# Patient Record
Sex: Female | Born: 1945 | Race: Black or African American | Hispanic: No | Marital: Married | Smoking: Never smoker
Health system: Southern US, Community
[De-identification: ages and names within clinical notes are randomized; demographics above are authoritative.]

## PROBLEM LIST (undated history)

## (undated) DIAGNOSIS — M19071 Primary osteoarthritis, right ankle and foot: Secondary | ICD-10-CM

## (undated) DIAGNOSIS — M8580 Other specified disorders of bone density and structure, unspecified site: Secondary | ICD-10-CM

## (undated) DIAGNOSIS — M25561 Pain in right knee: Secondary | ICD-10-CM

## (undated) DIAGNOSIS — K5792 Diverticulitis of intestine, part unspecified, without perforation or abscess without bleeding: Secondary | ICD-10-CM

## (undated) DIAGNOSIS — M170 Bilateral primary osteoarthritis of knee: Secondary | ICD-10-CM

## (undated) DIAGNOSIS — I10 Essential (primary) hypertension: Secondary | ICD-10-CM

## (undated) DIAGNOSIS — M109 Gout, unspecified: Secondary | ICD-10-CM

## (undated) DIAGNOSIS — L0292 Furuncle, unspecified: Secondary | ICD-10-CM

## (undated) DIAGNOSIS — M19042 Primary osteoarthritis, left hand: Secondary | ICD-10-CM

## (undated) DIAGNOSIS — M19041 Primary osteoarthritis, right hand: Secondary | ICD-10-CM

## (undated) DIAGNOSIS — M25562 Pain in left knee: Secondary | ICD-10-CM

## (undated) DIAGNOSIS — L0293 Carbuncle, unspecified: Secondary | ICD-10-CM

## (undated) DIAGNOSIS — M19072 Primary osteoarthritis, left ankle and foot: Secondary | ICD-10-CM

## (undated) DIAGNOSIS — G4700 Insomnia, unspecified: Secondary | ICD-10-CM

## (undated) DIAGNOSIS — M1990 Unspecified osteoarthritis, unspecified site: Secondary | ICD-10-CM

## (undated) DIAGNOSIS — E785 Hyperlipidemia, unspecified: Secondary | ICD-10-CM

## (undated) HISTORY — DX: Pain in right knee: M25.561

## (undated) HISTORY — DX: Gout, unspecified: M10.9

## (undated) HISTORY — DX: Essential (primary) hypertension: I10

## (undated) HISTORY — DX: Other specified disorders of bone density and structure, unspecified site: M85.80

## (undated) HISTORY — DX: Furuncle, unspecified: L02.92

## (undated) HISTORY — DX: Unspecified osteoarthritis, unspecified site: M19.90

## (undated) HISTORY — DX: Insomnia, unspecified: G47.00

## (undated) HISTORY — DX: Pain in right knee: M25.562

## (undated) HISTORY — DX: Primary osteoarthritis, left hand: M19.042

## (undated) HISTORY — DX: Carbuncle, unspecified: L02.93

## (undated) HISTORY — DX: Primary osteoarthritis, left ankle and foot: M19.072

## (undated) HISTORY — DX: Hyperlipidemia, unspecified: E78.5

## (undated) HISTORY — DX: Bilateral primary osteoarthritis of knee: M17.0

## (undated) HISTORY — PX: CHOLECYSTECTOMY: SHX55

## (undated) HISTORY — DX: Primary osteoarthritis, right ankle and foot: M19.071

## (undated) HISTORY — DX: Diverticulitis of intestine, part unspecified, without perforation or abscess without bleeding: K57.92

## (undated) HISTORY — DX: Primary osteoarthritis, right hand: M19.041

## (undated) HISTORY — PX: TONSILLECTOMY: SUR1361

---

## 1998-11-15 DIAGNOSIS — K5792 Diverticulitis of intestine, part unspecified, without perforation or abscess without bleeding: Secondary | ICD-10-CM

## 1998-11-15 HISTORY — DX: Diverticulitis of intestine, part unspecified, without perforation or abscess without bleeding: K57.92

## 1999-02-16 ENCOUNTER — Other Ambulatory Visit: Admission: RE | Admit: 1999-02-16 | Discharge: 1999-02-16 | Payer: Self-pay | Admitting: Obstetrics

## 1999-04-23 ENCOUNTER — Other Ambulatory Visit: Admission: RE | Admit: 1999-04-23 | Discharge: 1999-04-23 | Payer: Self-pay | Admitting: Obstetrics

## 2000-01-22 ENCOUNTER — Inpatient Hospital Stay (HOSPITAL_COMMUNITY): Admission: AD | Admit: 2000-01-22 | Discharge: 2000-01-25 | Payer: Self-pay | Admitting: General Surgery

## 2000-01-22 ENCOUNTER — Encounter (HOSPITAL_BASED_OUTPATIENT_CLINIC_OR_DEPARTMENT_OTHER): Payer: Self-pay | Admitting: General Surgery

## 2000-03-02 ENCOUNTER — Other Ambulatory Visit: Admission: RE | Admit: 2000-03-02 | Discharge: 2000-03-02 | Payer: Self-pay | Admitting: Obstetrics

## 2000-03-21 ENCOUNTER — Encounter (INDEPENDENT_AMBULATORY_CARE_PROVIDER_SITE_OTHER): Payer: Self-pay | Admitting: Specialist

## 2000-03-21 ENCOUNTER — Ambulatory Visit (HOSPITAL_COMMUNITY): Admission: RE | Admit: 2000-03-21 | Discharge: 2000-03-21 | Payer: Self-pay | Admitting: Gastroenterology

## 2001-03-15 ENCOUNTER — Other Ambulatory Visit: Admission: RE | Admit: 2001-03-15 | Discharge: 2001-03-15 | Payer: Self-pay | Admitting: Obstetrics

## 2002-08-22 ENCOUNTER — Encounter: Admission: RE | Admit: 2002-08-22 | Discharge: 2002-11-20 | Payer: Self-pay | Admitting: Family Medicine

## 2002-09-12 ENCOUNTER — Encounter: Admission: RE | Admit: 2002-09-12 | Discharge: 2002-10-08 | Payer: Self-pay | Admitting: Family Medicine

## 2002-09-20 ENCOUNTER — Encounter: Admission: RE | Admit: 2002-09-20 | Discharge: 2002-12-19 | Payer: Self-pay | Admitting: Family Medicine

## 2003-02-12 ENCOUNTER — Encounter: Admission: RE | Admit: 2003-02-12 | Discharge: 2003-05-13 | Payer: Self-pay | Admitting: Family Medicine

## 2003-06-13 ENCOUNTER — Encounter: Admission: RE | Admit: 2003-06-13 | Discharge: 2003-06-13 | Payer: Self-pay | Admitting: Family Medicine

## 2003-06-13 ENCOUNTER — Encounter: Payer: Self-pay | Admitting: Family Medicine

## 2005-09-29 ENCOUNTER — Encounter: Payer: Self-pay | Admitting: Internal Medicine

## 2006-06-20 ENCOUNTER — Ambulatory Visit: Payer: Self-pay | Admitting: Family Medicine

## 2006-08-03 ENCOUNTER — Ambulatory Visit: Payer: Self-pay | Admitting: Family Medicine

## 2006-08-15 ENCOUNTER — Ambulatory Visit: Payer: Self-pay | Admitting: Family Medicine

## 2006-08-19 ENCOUNTER — Ambulatory Visit: Payer: Self-pay | Admitting: Family Medicine

## 2006-09-16 ENCOUNTER — Ambulatory Visit: Payer: Self-pay | Admitting: Family Medicine

## 2006-10-10 ENCOUNTER — Ambulatory Visit: Payer: Self-pay | Admitting: Family Medicine

## 2006-11-03 ENCOUNTER — Ambulatory Visit: Payer: Self-pay | Admitting: Family Medicine

## 2006-12-12 ENCOUNTER — Ambulatory Visit: Payer: Self-pay | Admitting: Internal Medicine

## 2006-12-13 ENCOUNTER — Ambulatory Visit: Payer: Self-pay | Admitting: Family Medicine

## 2006-12-13 LAB — CONVERTED CEMR LAB
ALT: 22 units/L (ref 0–40)
AST: 20 units/L (ref 0–37)
BUN: 15 mg/dL (ref 6–23)
CO2: 26 meq/L (ref 19–32)
Calcium: 9.5 mg/dL (ref 8.4–10.5)
Chloride: 108 meq/L (ref 96–112)
Cholesterol: 147 mg/dL (ref 0–200)
Creatinine, Ser: 0.7 mg/dL (ref 0.4–1.2)
Creatinine,U: 111.4 mg/dL
GFR calc Af Amer: 110 mL/min
GFR calc non Af Amer: 91 mL/min
Glucose, Bld: 129 mg/dL — ABNORMAL HIGH (ref 70–99)
HDL: 76.6 mg/dL (ref >39.0)
Hgb A1c MFr Bld: 6.7 % — ABNORMAL HIGH (ref 4.6–6.0)
LDL Cholesterol: 59 mg/dL (ref 0–99)
Microalb Creat Ratio: 3.6 mg/g (ref 0.0–30.0)
Microalb, Ur: 0.4 mg/dL (ref 0.0–1.9)
Potassium: 3.8 meq/L (ref 3.5–5.1)
Sodium: 141 meq/L (ref 135–145)
Total CHOL/HDL Ratio: 1.9
Triglycerides: 56 mg/dL (ref 0–149)
VLDL: 11 mg/dL (ref 0–40)

## 2007-01-02 DIAGNOSIS — E119 Type 2 diabetes mellitus without complications: Secondary | ICD-10-CM

## 2007-01-02 DIAGNOSIS — E1165 Type 2 diabetes mellitus with hyperglycemia: Secondary | ICD-10-CM | POA: Insufficient documentation

## 2007-01-10 ENCOUNTER — Ambulatory Visit: Payer: Self-pay | Admitting: Family Medicine

## 2007-01-10 LAB — CONVERTED CEMR LAB
BUN: 11 mg/dL (ref 6–23)
CO2: 30 meq/L (ref 19–32)
Calcium: 9.7 mg/dL (ref 8.4–10.5)
Chloride: 109 meq/L (ref 96–112)
Creatinine, Ser: 0.7 mg/dL (ref 0.4–1.2)
GFR calc Af Amer: 110 mL/min
GFR calc non Af Amer: 91 mL/min
Glucose, Bld: 129 mg/dL — ABNORMAL HIGH (ref 70–99)
Potassium: 3.8 meq/L (ref 3.5–5.1)
Sodium: 145 meq/L (ref 135–145)

## 2007-01-11 ENCOUNTER — Encounter: Payer: Self-pay | Admitting: Internal Medicine

## 2007-03-14 ENCOUNTER — Ambulatory Visit: Payer: Self-pay | Admitting: Family Medicine

## 2007-03-14 LAB — CONVERTED CEMR LAB
BUN: 13 mg/dL (ref 6–23)
CO2: 30 meq/L (ref 19–32)
Calcium: 10 mg/dL (ref 8.4–10.5)
Chloride: 108 meq/L (ref 96–112)
Creatinine, Ser: 0.6 mg/dL (ref 0.4–1.2)
GFR calc Af Amer: 132 mL/min
GFR calc non Af Amer: 109 mL/min
Glucose, Bld: 149 mg/dL — ABNORMAL HIGH (ref 70–99)
Hgb A1c MFr Bld: 6.8 % — ABNORMAL HIGH (ref 4.6–6.0)
Potassium: 4.4 meq/L (ref 3.5–5.1)
Sodium: 143 meq/L (ref 135–145)

## 2007-05-01 ENCOUNTER — Ambulatory Visit: Payer: Self-pay | Admitting: Internal Medicine

## 2007-05-01 LAB — CONVERTED CEMR LAB
Bilirubin Urine: NEGATIVE
Glucose, Urine, Semiquant: NEGATIVE
Ketones, urine, test strip: NEGATIVE
Nitrite: NEGATIVE
Protein, U semiquant: 30
Specific Gravity, Urine: >=1.03
Urobilinogen, UA: NEGATIVE
pH: 6

## 2007-05-04 ENCOUNTER — Encounter: Payer: Self-pay | Admitting: Internal Medicine

## 2007-06-14 ENCOUNTER — Ambulatory Visit: Payer: Self-pay | Admitting: Family Medicine

## 2007-06-14 DIAGNOSIS — E785 Hyperlipidemia, unspecified: Secondary | ICD-10-CM

## 2007-06-14 DIAGNOSIS — M1990 Unspecified osteoarthritis, unspecified site: Secondary | ICD-10-CM | POA: Insufficient documentation

## 2007-06-14 DIAGNOSIS — E1169 Type 2 diabetes mellitus with other specified complication: Secondary | ICD-10-CM | POA: Insufficient documentation

## 2007-06-14 DIAGNOSIS — I10 Essential (primary) hypertension: Secondary | ICD-10-CM

## 2007-06-14 LAB — CONVERTED CEMR LAB
ALT: 19 units/L (ref 0–35)
AST: 17 units/L (ref 0–37)
BUN: 15 mg/dL (ref 6–23)
CO2: 28 meq/L (ref 19–32)
Calcium: 9.7 mg/dL (ref 8.4–10.5)
Chloride: 105 meq/L (ref 96–112)
Cholesterol: 154 mg/dL (ref 0–200)
Creatinine, Ser: 0.7 mg/dL (ref 0.4–1.2)
Creatinine,U: 131.4 mg/dL
GFR calc Af Amer: 110 mL/min
GFR calc non Af Amer: 91 mL/min
Glucose, Bld: 120 mg/dL — ABNORMAL HIGH (ref 70–99)
HDL: 67.2 mg/dL (ref >39.0)
Hgb A1c MFr Bld: 6.1 % — ABNORMAL HIGH (ref 4.6–6.0)
LDL Cholesterol: 76 mg/dL (ref 0–99)
Microalb Creat Ratio: 2.3 mg/g (ref 0.0–30.0)
Microalb, Ur: 0.3 mg/dL (ref 0.0–1.9)
Potassium: 3.6 meq/L (ref 3.5–5.1)
Sodium: 140 meq/L (ref 135–145)
Total CHOL/HDL Ratio: 2.3
Triglycerides: 55 mg/dL (ref 0–149)
VLDL: 11 mg/dL (ref 0–40)

## 2007-06-15 ENCOUNTER — Ambulatory Visit: Payer: Self-pay | Admitting: Cardiology

## 2007-06-16 ENCOUNTER — Telehealth (INDEPENDENT_AMBULATORY_CARE_PROVIDER_SITE_OTHER): Payer: Self-pay | Admitting: *Deleted

## 2007-06-23 ENCOUNTER — Encounter (INDEPENDENT_AMBULATORY_CARE_PROVIDER_SITE_OTHER): Payer: Self-pay | Admitting: Family Medicine

## 2007-06-23 ENCOUNTER — Ambulatory Visit: Payer: Self-pay

## 2007-07-03 ENCOUNTER — Encounter (INDEPENDENT_AMBULATORY_CARE_PROVIDER_SITE_OTHER): Payer: Self-pay | Admitting: Family Medicine

## 2007-07-07 ENCOUNTER — Ambulatory Visit: Payer: Self-pay | Admitting: Family Medicine

## 2007-07-07 LAB — CONVERTED CEMR LAB
Cholesterol, target level: 200 mg/dL
HDL goal, serum: 40 mg/dL
LDL Goal: 100 mg/dL

## 2007-08-17 ENCOUNTER — Encounter (INDEPENDENT_AMBULATORY_CARE_PROVIDER_SITE_OTHER): Payer: Self-pay | Admitting: Family Medicine

## 2007-08-24 ENCOUNTER — Telehealth (INDEPENDENT_AMBULATORY_CARE_PROVIDER_SITE_OTHER): Payer: Self-pay | Admitting: *Deleted

## 2007-08-25 ENCOUNTER — Ambulatory Visit: Payer: Self-pay | Admitting: Family Medicine

## 2007-08-25 ENCOUNTER — Telehealth (INDEPENDENT_AMBULATORY_CARE_PROVIDER_SITE_OTHER): Payer: Self-pay | Admitting: *Deleted

## 2007-08-26 ENCOUNTER — Telehealth (INDEPENDENT_AMBULATORY_CARE_PROVIDER_SITE_OTHER): Payer: Self-pay | Admitting: Family Medicine

## 2007-09-11 ENCOUNTER — Telehealth (INDEPENDENT_AMBULATORY_CARE_PROVIDER_SITE_OTHER): Payer: Self-pay | Admitting: *Deleted

## 2007-10-02 ENCOUNTER — Telehealth (INDEPENDENT_AMBULATORY_CARE_PROVIDER_SITE_OTHER): Payer: Self-pay | Admitting: *Deleted

## 2007-10-10 ENCOUNTER — Ambulatory Visit: Payer: Self-pay | Admitting: Family Medicine

## 2007-10-10 LAB — CONVERTED CEMR LAB
CO2: 30 meq/L (ref 19–32)
Calcium: 10 mg/dL (ref 8.4–10.5)
Cholesterol: 168 mg/dL (ref 0–200)
GFR calc Af Amer: 131 mL/min
GFR calc non Af Amer: 108 mL/min
Glucose, Bld: 133 mg/dL — ABNORMAL HIGH (ref 70–99)
LDL Cholesterol: 71 mg/dL (ref 0–99)
Potassium: 4.1 meq/L (ref 3.5–5.1)
Total CHOL/HDL Ratio: 1.9
Triglycerides: 46 mg/dL (ref 0–149)

## 2007-10-11 ENCOUNTER — Encounter (INDEPENDENT_AMBULATORY_CARE_PROVIDER_SITE_OTHER): Payer: Self-pay | Admitting: *Deleted

## 2007-10-19 IMAGING — CR DG CHEST 2V
2 series · 2 of 2 positions shown · non-contrast
Comparison: 05/24/2003 digitized chest x-ray from [REDACTED].

CLINICAL DATA: Cough.
PA AND LATERAL CHEST:

[view not recorded (1 of 2)]
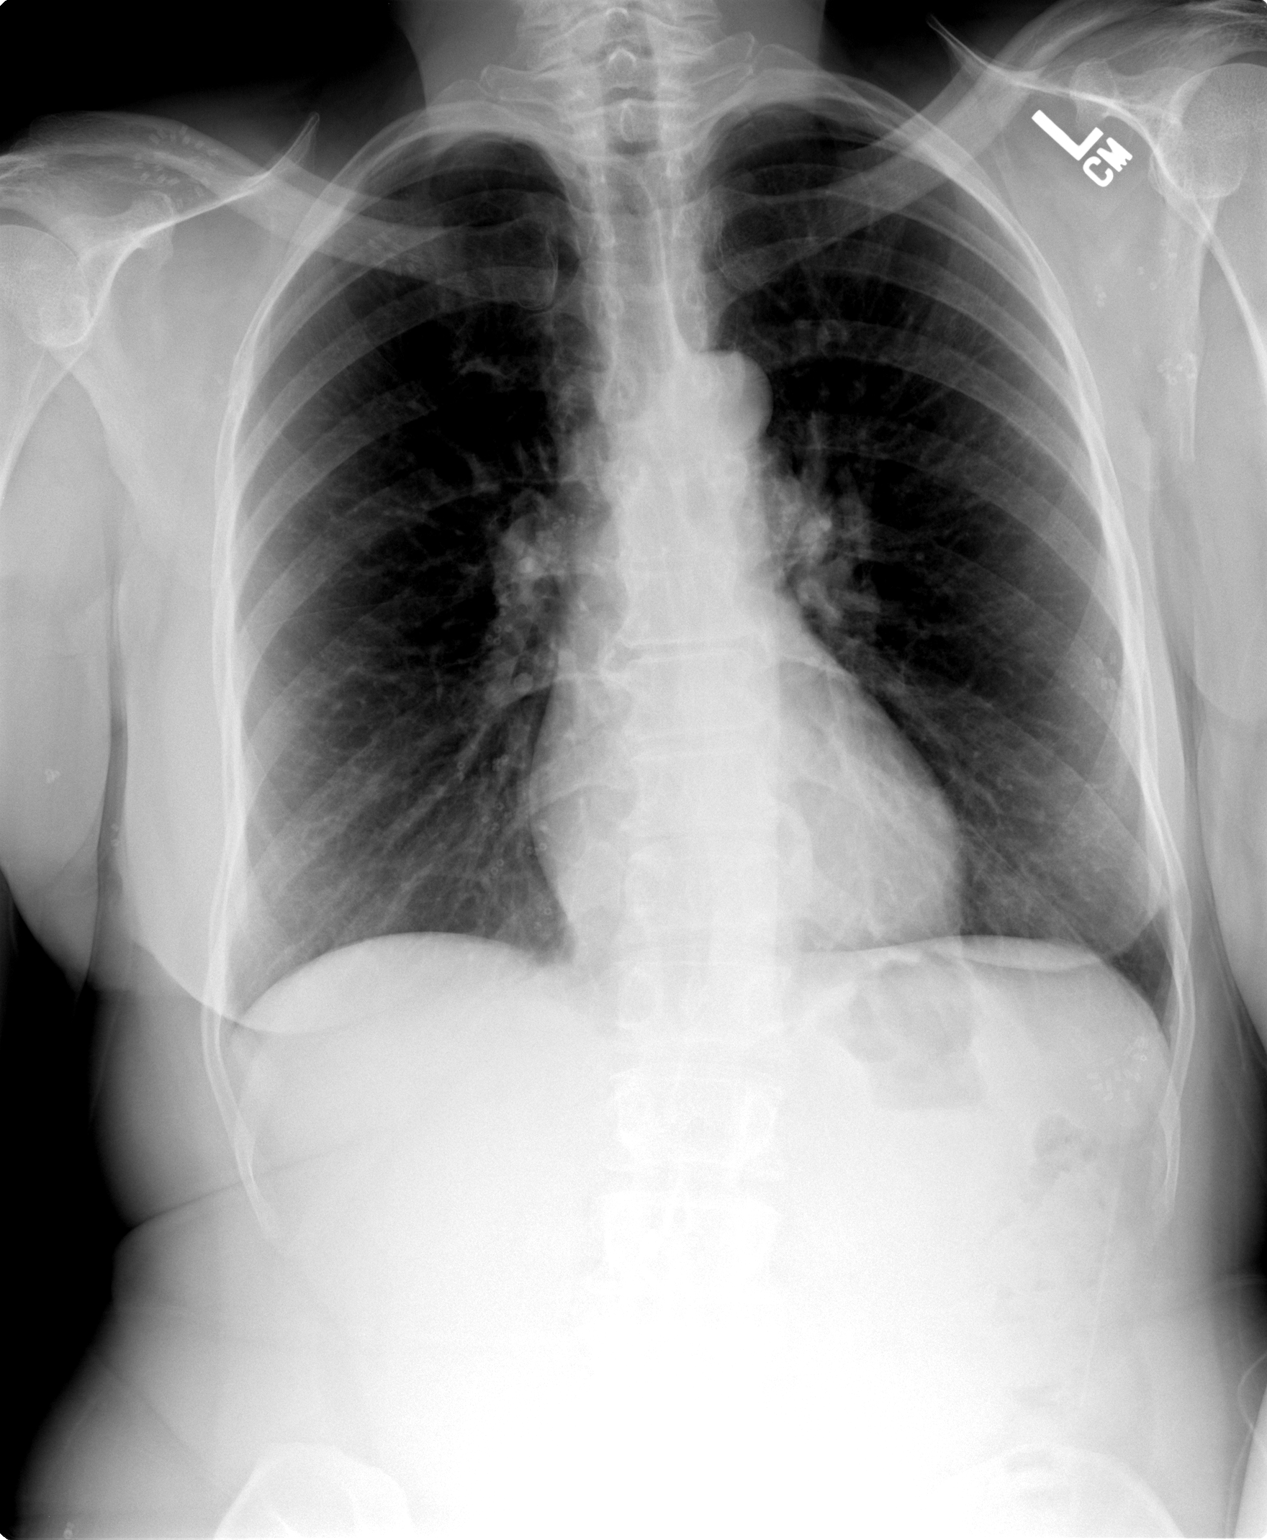

[view not recorded (2 of 2)]
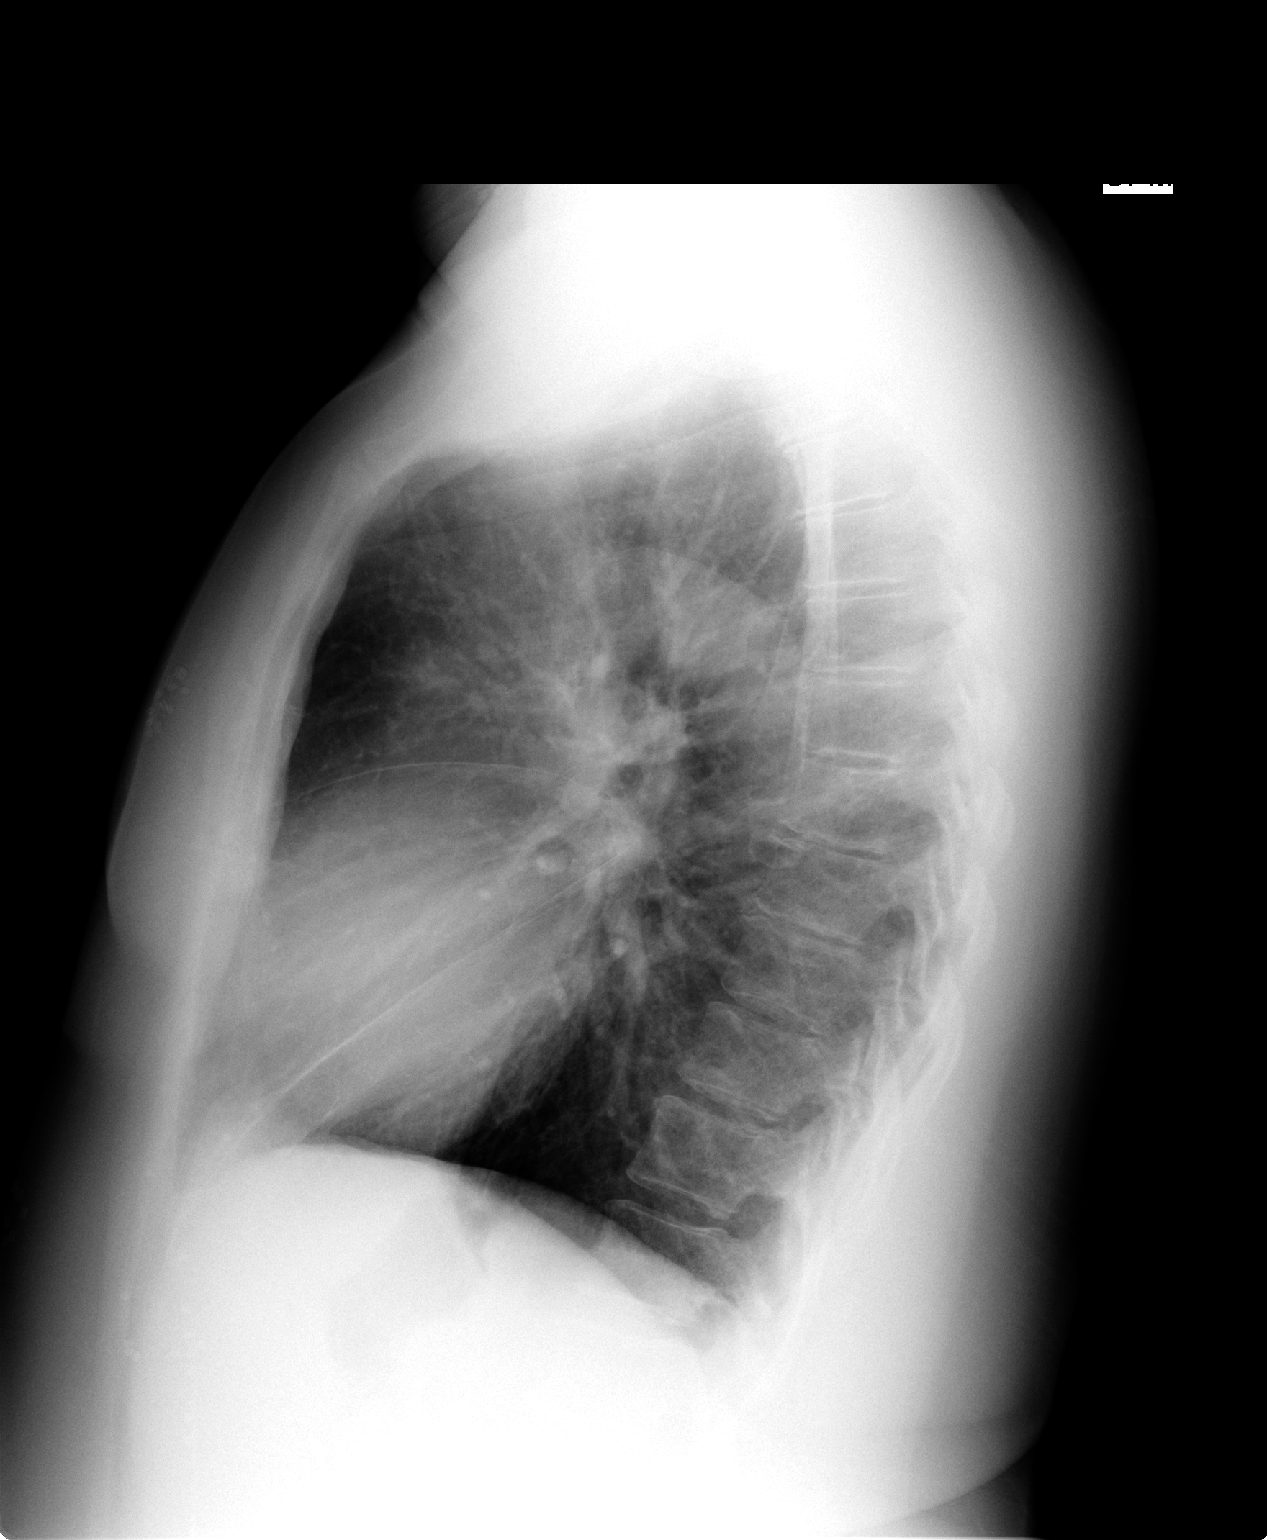

[2 of 2 positions shown; findings below may reference images not displayed]

FINDINGS: The heart size and mediastinal contours are within normal limits.  Both lungs are clear.  The visualized skeletal structures are unremarkable.
Clips project over the epigastrium.  Multiple radio-opacities project over the patient?s soft tissues and the body itself, raising the question of something overlying the patient such as clothing with radiopaque objects.
IMPRESSION: 1. No active cardiopulmonary disease.
2. Multiple radio-opacities overlie the patient; is the patient wearing an item of clothing with beads or other radiopaque objects?

## 2007-10-26 ENCOUNTER — Telehealth (INDEPENDENT_AMBULATORY_CARE_PROVIDER_SITE_OTHER): Payer: Self-pay | Admitting: *Deleted

## 2007-10-31 ENCOUNTER — Ambulatory Visit: Payer: Self-pay | Admitting: Family Medicine

## 2007-11-06 ENCOUNTER — Telehealth (INDEPENDENT_AMBULATORY_CARE_PROVIDER_SITE_OTHER): Payer: Self-pay | Admitting: *Deleted

## 2007-11-13 ENCOUNTER — Telehealth (INDEPENDENT_AMBULATORY_CARE_PROVIDER_SITE_OTHER): Payer: Self-pay | Admitting: *Deleted

## 2007-12-04 ENCOUNTER — Telehealth (INDEPENDENT_AMBULATORY_CARE_PROVIDER_SITE_OTHER): Payer: Self-pay | Admitting: *Deleted

## 2007-12-11 ENCOUNTER — Telehealth (INDEPENDENT_AMBULATORY_CARE_PROVIDER_SITE_OTHER): Payer: Self-pay | Admitting: *Deleted

## 2008-01-02 ENCOUNTER — Telehealth (INDEPENDENT_AMBULATORY_CARE_PROVIDER_SITE_OTHER): Payer: Self-pay | Admitting: *Deleted

## 2008-01-11 ENCOUNTER — Ambulatory Visit: Payer: Self-pay | Admitting: Family Medicine

## 2008-01-11 ENCOUNTER — Telehealth (INDEPENDENT_AMBULATORY_CARE_PROVIDER_SITE_OTHER): Payer: Self-pay | Admitting: *Deleted

## 2008-01-16 LAB — CONVERTED CEMR LAB
CO2: 28 meq/L (ref 19–32)
Chloride: 106 meq/L (ref 96–112)
Creatinine, Ser: 0.7 mg/dL (ref 0.4–1.2)
Glucose, Bld: 130 mg/dL — ABNORMAL HIGH (ref 70–99)
Potassium: 4.2 meq/L (ref 3.5–5.1)
Sodium: 140 meq/L (ref 135–145)

## 2008-01-17 ENCOUNTER — Encounter (INDEPENDENT_AMBULATORY_CARE_PROVIDER_SITE_OTHER): Payer: Self-pay | Admitting: *Deleted

## 2008-02-01 ENCOUNTER — Telehealth (INDEPENDENT_AMBULATORY_CARE_PROVIDER_SITE_OTHER): Payer: Self-pay | Admitting: *Deleted

## 2008-02-08 ENCOUNTER — Encounter: Payer: Self-pay | Admitting: Internal Medicine

## 2008-02-28 ENCOUNTER — Telehealth: Payer: Self-pay | Admitting: Internal Medicine

## 2008-03-18 ENCOUNTER — Telehealth (INDEPENDENT_AMBULATORY_CARE_PROVIDER_SITE_OTHER): Payer: Self-pay | Admitting: *Deleted

## 2008-03-29 ENCOUNTER — Telehealth (INDEPENDENT_AMBULATORY_CARE_PROVIDER_SITE_OTHER): Payer: Self-pay | Admitting: *Deleted

## 2008-04-09 ENCOUNTER — Telehealth (INDEPENDENT_AMBULATORY_CARE_PROVIDER_SITE_OTHER): Payer: Self-pay | Admitting: *Deleted

## 2008-05-10 ENCOUNTER — Ambulatory Visit: Payer: Self-pay | Admitting: Internal Medicine

## 2008-05-15 ENCOUNTER — Encounter (INDEPENDENT_AMBULATORY_CARE_PROVIDER_SITE_OTHER): Payer: Self-pay | Admitting: *Deleted

## 2008-05-15 LAB — CONVERTED CEMR LAB
AST: 19 units/L (ref 0–37)
Creatinine,U: 123.7 mg/dL
Eosinophils Absolute: 0.2 10*3/uL (ref 0.0–0.7)
HCT: 37.6 % (ref 36.0–46.0)
Hemoglobin: 12.9 g/dL (ref 12.0–15.0)
MCHC: 34.4 g/dL (ref 30.0–36.0)
MCV: 90.8 fL (ref 78.0–100.0)
Monocytes Absolute: 0.3 10*3/uL (ref 0.1–1.0)
Monocytes Relative: 6.4 % (ref 3.0–12.0)
Neutro Abs: 2.6 10*3/uL (ref 1.4–7.7)
Platelets: 239 10*3/uL (ref 150–400)
RDW: 12.5 % (ref 11.5–14.6)

## 2008-07-26 ENCOUNTER — Telehealth (INDEPENDENT_AMBULATORY_CARE_PROVIDER_SITE_OTHER): Payer: Self-pay | Admitting: *Deleted

## 2008-08-20 ENCOUNTER — Telehealth (INDEPENDENT_AMBULATORY_CARE_PROVIDER_SITE_OTHER): Payer: Self-pay | Admitting: *Deleted

## 2008-08-21 ENCOUNTER — Telehealth (INDEPENDENT_AMBULATORY_CARE_PROVIDER_SITE_OTHER): Payer: Self-pay | Admitting: *Deleted

## 2008-09-05 ENCOUNTER — Telehealth (INDEPENDENT_AMBULATORY_CARE_PROVIDER_SITE_OTHER): Payer: Self-pay | Admitting: *Deleted

## 2008-09-10 ENCOUNTER — Telehealth (INDEPENDENT_AMBULATORY_CARE_PROVIDER_SITE_OTHER): Payer: Self-pay | Admitting: *Deleted

## 2008-09-12 ENCOUNTER — Ambulatory Visit: Payer: Self-pay | Admitting: Internal Medicine

## 2008-09-12 DIAGNOSIS — M1120 Other chondrocalcinosis, unspecified site: Secondary | ICD-10-CM | POA: Insufficient documentation

## 2008-09-13 ENCOUNTER — Ambulatory Visit: Payer: Self-pay | Admitting: Internal Medicine

## 2008-09-17 ENCOUNTER — Telehealth: Payer: Self-pay | Admitting: Internal Medicine

## 2008-09-17 ENCOUNTER — Encounter (INDEPENDENT_AMBULATORY_CARE_PROVIDER_SITE_OTHER): Payer: Self-pay | Admitting: *Deleted

## 2008-09-17 LAB — CONVERTED CEMR LAB
BUN: 14 mg/dL (ref 6–23)
CO2: 29 meq/L (ref 19–32)
Calcium: 9.5 mg/dL (ref 8.4–10.5)
Chloride: 110 meq/L (ref 96–112)
Cholesterol: 138 mg/dL (ref 0–200)
Creatinine, Ser: 0.6 mg/dL (ref 0.4–1.2)
GFR calc non Af Amer: 108 mL/min
HDL: 72.2 mg/dL (ref >39.0)
LDL Cholesterol: 57 mg/dL (ref 0–99)
Triglycerides: 43 mg/dL (ref 0–149)

## 2008-11-04 ENCOUNTER — Telehealth (INDEPENDENT_AMBULATORY_CARE_PROVIDER_SITE_OTHER): Payer: Self-pay | Admitting: *Deleted

## 2008-11-11 ENCOUNTER — Telehealth (INDEPENDENT_AMBULATORY_CARE_PROVIDER_SITE_OTHER): Payer: Self-pay | Admitting: *Deleted

## 2008-11-12 ENCOUNTER — Telehealth (INDEPENDENT_AMBULATORY_CARE_PROVIDER_SITE_OTHER): Payer: Self-pay | Admitting: *Deleted

## 2008-11-18 ENCOUNTER — Telehealth (INDEPENDENT_AMBULATORY_CARE_PROVIDER_SITE_OTHER): Payer: Self-pay | Admitting: *Deleted

## 2008-11-19 ENCOUNTER — Ambulatory Visit: Payer: Self-pay | Admitting: Family Medicine

## 2008-11-27 ENCOUNTER — Encounter (INDEPENDENT_AMBULATORY_CARE_PROVIDER_SITE_OTHER): Payer: Self-pay | Admitting: *Deleted

## 2008-12-25 ENCOUNTER — Telehealth (INDEPENDENT_AMBULATORY_CARE_PROVIDER_SITE_OTHER): Payer: Self-pay | Admitting: *Deleted

## 2009-01-10 ENCOUNTER — Ambulatory Visit: Payer: Self-pay | Admitting: Internal Medicine

## 2009-01-10 ENCOUNTER — Telehealth (INDEPENDENT_AMBULATORY_CARE_PROVIDER_SITE_OTHER): Payer: Self-pay | Admitting: *Deleted

## 2009-01-10 DIAGNOSIS — R1310 Dysphagia, unspecified: Secondary | ICD-10-CM | POA: Insufficient documentation

## 2009-01-15 LAB — CONVERTED CEMR LAB
ALT: 26 units/L (ref 0–35)
AST: 21 units/L (ref 0–37)
Alkaline Phosphatase: 81 units/L (ref 39–117)
Bilirubin, Direct: 0.1 mg/dL (ref 0.0–0.3)
Eosinophils Relative: 3.4 % (ref 0.0–5.0)
HDL: 73.3 mg/dL (ref >39.0)
Monocytes Relative: 6.3 % (ref 3.0–12.0)
Neutrophils Relative %: 50.3 % (ref 43.0–77.0)
Platelets: 240 10*3/uL (ref 150–400)
Total CHOL/HDL Ratio: 2.1
Total Protein: 7.5 g/dL (ref 6.0–8.3)
VLDL: 12 mg/dL (ref 0–40)
WBC: 3.9 10*3/uL — ABNORMAL LOW (ref 4.5–10.5)

## 2009-01-17 ENCOUNTER — Encounter: Payer: Self-pay | Admitting: Internal Medicine

## 2009-01-21 ENCOUNTER — Ambulatory Visit: Payer: Self-pay | Admitting: Internal Medicine

## 2009-01-21 ENCOUNTER — Encounter: Payer: Self-pay | Admitting: Internal Medicine

## 2009-01-28 ENCOUNTER — Telehealth (INDEPENDENT_AMBULATORY_CARE_PROVIDER_SITE_OTHER): Payer: Self-pay | Admitting: *Deleted

## 2009-02-13 ENCOUNTER — Telehealth (INDEPENDENT_AMBULATORY_CARE_PROVIDER_SITE_OTHER): Payer: Self-pay | Admitting: *Deleted

## 2009-02-19 ENCOUNTER — Encounter (INDEPENDENT_AMBULATORY_CARE_PROVIDER_SITE_OTHER): Payer: Self-pay | Admitting: *Deleted

## 2009-03-05 ENCOUNTER — Telehealth: Payer: Self-pay | Admitting: Internal Medicine

## 2009-03-07 ENCOUNTER — Telehealth (INDEPENDENT_AMBULATORY_CARE_PROVIDER_SITE_OTHER): Payer: Self-pay | Admitting: *Deleted

## 2009-04-03 ENCOUNTER — Encounter: Payer: Self-pay | Admitting: Internal Medicine

## 2009-04-08 ENCOUNTER — Telehealth (INDEPENDENT_AMBULATORY_CARE_PROVIDER_SITE_OTHER): Payer: Self-pay | Admitting: *Deleted

## 2009-05-14 ENCOUNTER — Ambulatory Visit: Payer: Self-pay | Admitting: Internal Medicine

## 2009-05-14 DIAGNOSIS — M8580 Other specified disorders of bone density and structure, unspecified site: Secondary | ICD-10-CM | POA: Insufficient documentation

## 2009-05-16 ENCOUNTER — Encounter (INDEPENDENT_AMBULATORY_CARE_PROVIDER_SITE_OTHER): Payer: Self-pay | Admitting: *Deleted

## 2009-05-16 LAB — CONVERTED CEMR LAB
CO2: 28 meq/L (ref 19–32)
GFR calc non Af Amer: 130.32 mL/min (ref >60)
Glucose, Bld: 112 mg/dL — ABNORMAL HIGH (ref 70–99)
Potassium: 4.2 meq/L (ref 3.5–5.1)
Sodium: 143 meq/L (ref 135–145)

## 2009-09-30 ENCOUNTER — Encounter: Payer: Self-pay | Admitting: Internal Medicine

## 2009-10-03 ENCOUNTER — Encounter: Payer: Self-pay | Admitting: Internal Medicine

## 2009-10-03 LAB — CONVERTED CEMR LAB
ANA Titer 1: NEGATIVE
AST: 15 units/L
Albumin: 4.7 g/dL
BUN: 14 mg/dL
Calcium: 10 mg/dL
HCT: 38.9 %
Hemoglobin: 12.7 g/dL
Platelets: 283 10*3/uL
Rhuematoid fact SerPl-aCnc: <20 intl units/mL
Sed Rate: 12 mm/hr
Sodium, serum: 141 mmol/L

## 2009-10-06 ENCOUNTER — Telehealth (INDEPENDENT_AMBULATORY_CARE_PROVIDER_SITE_OTHER): Payer: Self-pay | Admitting: *Deleted

## 2009-10-07 ENCOUNTER — Encounter (INDEPENDENT_AMBULATORY_CARE_PROVIDER_SITE_OTHER): Payer: Self-pay | Admitting: *Deleted

## 2009-10-14 ENCOUNTER — Ambulatory Visit: Payer: Self-pay | Admitting: Internal Medicine

## 2009-10-23 ENCOUNTER — Encounter (INDEPENDENT_AMBULATORY_CARE_PROVIDER_SITE_OTHER): Payer: Self-pay | Admitting: *Deleted

## 2009-10-23 LAB — CONVERTED CEMR LAB
Hgb A1c MFr Bld: 6.2 % (ref 4.6–6.5)
Total CK: 98 units/L (ref 7–177)

## 2009-12-10 ENCOUNTER — Encounter: Payer: Self-pay | Admitting: Internal Medicine

## 2010-03-17 ENCOUNTER — Encounter (INDEPENDENT_AMBULATORY_CARE_PROVIDER_SITE_OTHER): Payer: Self-pay | Admitting: *Deleted

## 2010-04-14 ENCOUNTER — Ambulatory Visit: Payer: Self-pay | Admitting: Internal Medicine

## 2010-04-14 ENCOUNTER — Telehealth (INDEPENDENT_AMBULATORY_CARE_PROVIDER_SITE_OTHER): Payer: Self-pay | Admitting: *Deleted

## 2010-04-14 DIAGNOSIS — Z8719 Personal history of other diseases of the digestive system: Secondary | ICD-10-CM

## 2010-04-14 LAB — CONVERTED CEMR LAB
BUN: 15 mg/dL (ref 6–23)
Basophils Absolute: 0 10*3/uL (ref 0.0–0.1)
Calcium: 9.9 mg/dL (ref 8.4–10.5)
Cholesterol: 197 mg/dL (ref 0–200)
Creatinine, Ser: 0.6 mg/dL (ref 0.4–1.2)
Eosinophils Absolute: 0.1 10*3/uL (ref 0.0–0.7)
GFR calc non Af Amer: 127.47 mL/min (ref >60)
Glucose, Bld: 147 mg/dL — ABNORMAL HIGH (ref 70–99)
HDL: 74.4 mg/dL (ref >39.00)
Ketones, urine, test strip: NEGATIVE
Lymphocytes Relative: 24.4 % (ref 12.0–46.0)
Microalb, Ur: 1 mg/dL (ref 0.0–1.9)
Monocytes Relative: 5.5 % (ref 3.0–12.0)
Nitrite: NEGATIVE
Platelets: 283 10*3/uL (ref 150.0–400.0)
RDW: 13.1 % (ref 11.5–14.6)
Specific Gravity, Urine: 1.02
VLDL: 13.2 mg/dL (ref 0.0–40.0)
pH: 5

## 2010-04-15 ENCOUNTER — Encounter: Payer: Self-pay | Admitting: Internal Medicine

## 2010-04-15 LAB — CONVERTED CEMR LAB
Casts: NONE SEEN /lpf
RBC / HPF: NONE SEEN (ref <3)

## 2010-04-20 ENCOUNTER — Telehealth (INDEPENDENT_AMBULATORY_CARE_PROVIDER_SITE_OTHER): Payer: Self-pay | Admitting: *Deleted

## 2010-04-28 ENCOUNTER — Encounter: Admission: RE | Admit: 2010-04-28 | Discharge: 2010-04-28 | Payer: Self-pay | Admitting: Internal Medicine

## 2010-04-28 LAB — HM MAMMOGRAPHY: HM Mammogram: NEGATIVE

## 2010-05-01 ENCOUNTER — Telehealth: Payer: Self-pay | Admitting: Internal Medicine

## 2010-07-28 ENCOUNTER — Encounter: Payer: Self-pay | Admitting: Internal Medicine

## 2010-07-28 LAB — CONVERTED CEMR LAB
ALT: 12 units/L
AST: 14 units/L
Potassium, serum: 4.2 mmol/L
Uric Acid, Serum: 5.1 mg/dL
WBC, blood: 4.5 10*3/uL

## 2010-08-05 ENCOUNTER — Telehealth (INDEPENDENT_AMBULATORY_CARE_PROVIDER_SITE_OTHER): Payer: Self-pay | Admitting: *Deleted

## 2010-08-07 ENCOUNTER — Telehealth (INDEPENDENT_AMBULATORY_CARE_PROVIDER_SITE_OTHER): Payer: Self-pay | Admitting: *Deleted

## 2010-08-14 ENCOUNTER — Ambulatory Visit: Payer: Self-pay | Admitting: Internal Medicine

## 2010-08-14 LAB — CONVERTED CEMR LAB: Vit D, 25-Hydroxy: 60 ng/mL (ref 30–89)

## 2010-08-17 LAB — CONVERTED CEMR LAB
Cholesterol: 198 mg/dL (ref 0–200)
TSH: 0.86 microintl units/mL (ref 0.35–5.50)
Total CHOL/HDL Ratio: 2
Triglycerides: 53 mg/dL (ref 0.0–149.0)

## 2010-08-21 ENCOUNTER — Encounter: Payer: Self-pay | Admitting: Internal Medicine

## 2010-09-09 ENCOUNTER — Telehealth: Payer: Self-pay | Admitting: Internal Medicine

## 2010-09-10 ENCOUNTER — Ambulatory Visit: Payer: Self-pay | Admitting: Internal Medicine

## 2010-09-10 DIAGNOSIS — R350 Frequency of micturition: Secondary | ICD-10-CM | POA: Insufficient documentation

## 2010-09-10 DIAGNOSIS — N390 Urinary tract infection, site not specified: Secondary | ICD-10-CM | POA: Insufficient documentation

## 2010-09-10 LAB — CONVERTED CEMR LAB
Bilirubin Urine: NEGATIVE
Crystals: NONE SEEN
Glucose, Urine, Semiquant: NEGATIVE
Ketones, urine, test strip: NEGATIVE
Specific Gravity, Urine: 1.02
Urobilinogen, UA: 0.2
pH: 6.5

## 2010-09-11 ENCOUNTER — Encounter: Payer: Self-pay | Admitting: Internal Medicine

## 2010-10-05 ENCOUNTER — Encounter: Payer: Self-pay | Admitting: Internal Medicine

## 2010-10-05 LAB — HM COLONOSCOPY: HM Colonoscopy: NEGATIVE

## 2010-11-06 ENCOUNTER — Telehealth (INDEPENDENT_AMBULATORY_CARE_PROVIDER_SITE_OTHER): Payer: Self-pay | Admitting: *Deleted

## 2010-12-15 NOTE — Assessment & Plan Note (Signed)
Summary: UTI/drb    Vital Signs:  Patient profile:   65 year old female Weight:      149.50 pounds Pulse rate:   92 / minute Pulse rhythm:   regular BP sitting:   122 / 84  (left arm) Cuff size:   regular  Vitals Entered By: Army Fossa CMA (September 10, 2010 8:29 AM) CC: Pt here for possible UTI Comments urinary frequency started on 10[facility]17[facility] [address]    History of Present Illness: 2 weeks history of urinary frequency.  does have mild dysuria  ROS Denies fevers No nausea vomiting No vaginal discharge, vaginal irritation or rash. She had a Pap smear 2 weeks ago no gross hematuria Blood sugars in the 120s  Current Medications (verified): 1)  Cartia Xt 300 Mg  Cp24 (Diltiazem Hcl Coated Beads) .... Take One Capsule Daily 2)  Cozaar 100 Mg Tabs (Losartan Potassium) .... As Directed 3)  Metformin Hcl 500 Mg  Tabs (Metformin Hcl) .... Take One Tablet Twice Daily 4)  Ambien 5 Mg  Tabs (Zolpidem Tartrate) .... Take One Tablet At Bedtime 5)  Sulindac 150 Mg  Tabs (Sulindac) .... Take 1 Twice Daily 6)  Claritin 10 Mg  Caps (Loratadine) .... Qd 7)  Adult Aspirin Ec Low Strength 81 Mg  Tbec (Aspirin) 8)  Freestyle Lancets   Misc (Lancets) .... Use As Directed 1-2 Times Daily 9)  Freestyle Test   Strp (Glucose Blood) .... Use As Directed 1-2 Times Daily 10)  Fish Oil 11)  D2 2000 Iu 12)  Astatathin  Allergies (verified): 1)  ! Celebrex  Past History:  Past Medical History: Reviewed history from 08/14/2010 and no changes required. Diabetes w/ neuropathy, on neurontin since 07-2010 Hyperlipidemia Hypertension Osteoarthritis, gout, CPPD ------sees Dr Victory Dakin  Osteopenia per DEXA 4-10 recurrent boils, h/o MRSA 06-2007: CP and QW on EKG : neg stress test Diverticulitis, hx of (aprox 2000)  Past Surgical History: Reviewed history from 09/12/2008 and no changes required. Cholecystectomy Tonsillectomy  Social History: Reviewed history from 08/14/2010 and no  changes required. 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   Abdomen:  soft, non-tender, no distention, no masses, no guarding, and no rigidity.  no CVA tenderness   Impression & Recommendations:  Problem # 1:  UTI (ICD-599.0) symptoms consistent with cystitis, udip negative Plan: Urine culture Start empiric antibiotics see instructions  Her updated medication list for this problem includes:    Cipro 500 Mg Tabs (Ciprofloxacin hcl) .Marland Kitchen... 1 by mouth two times a day x 3 days  Complete Medication List: 1)  Cartia Xt 300 Mg Cp24 (Diltiazem hcl coated beads) .... Take one capsule daily 2)  Cozaar 100 Mg Tabs (Losartan potassium) .... As directed 3)  Metformin Hcl 500 Mg Tabs (Metformin hcl) .... Take one tablet twice daily 4)  Ambien 5 Mg Tabs (Zolpidem tartrate) .... Take one tablet at bedtime 5)  Sulindac 150 Mg Tabs (Sulindac) .... Take 1 twice daily 6)  Claritin 10 Mg Caps (Loratadine) .... Qd 7)  Adult Aspirin Ec Low Strength 81 Mg Tbec (Aspirin) 8)  Freestyle Lancets Misc (Lancets) .... Use as directed 1-2 times daily 9)  Freestyle Test Strp (Glucose blood) .... Use as directed 1-2 times daily 10)  Fish Oil  11)  D2 2000 Iu  12)  Astatathin  13)  Cipro 500 Mg Tabs (Ciprofloxacin hcl) .Marland Kitchen.. 1 by mouth two times a day x 3 days  Other Orders: UA Dipstick w/o Micro (automated)  (81003) T-Urine Microscopic ([id number]) T-Culture, Urine ([id number])  Patient Instructions: 1)  lots of fluids 2)  antibiotics x 3 days 3)  call if no better by next week or if you get worse  Prescriptions: CIPRO 500 MG TABS (CIPROFLOXACIN HCL) 1 by mouth two times a day x 3 days  #6 x 0   Entered and Authorized by:   Nolon Rod. Lyberti Thrush MD   Signed by:   Nolon Rod. Safiyya Stokes MD on 09/10/2010   Method used:    [facility]        [facility]  [address] [phone][facility] (retail)       [address]       [place], [place]  [address]       Ph: [phone] or [phone]       Fax: [phone]   RxID:   [id number]    Orders Added: 1)  UA Dipstick w/o Micro (automated)  [81003] 2)  T-Urine Microscopic [[id number]] 3)  T-Culture, Urine [[id number]] 4)  Est. Patient Level III [71696]    Laboratory Results   Urine Tests    Routine Urinalysis   Color: yellow Appearance: Clear Glucose: negative   (Normal Range: Negative) Bilirubin: negative   (Normal Range: Negative) Ketone: negative   (Normal Range: Negative) Spec. Gravity: 1.020   (Normal Range: 1.003-1.035) Blood: negative   (Normal Range: Negative) pH: 6.5   (Normal Range: 5.0-8.0) Protein: negative   (Normal Range: Negative) Urobilinogen: 0.2   (Normal Range: 0-1) Nitrite: negative   (Normal Range: Negative) Leukocyte Esterace: negative   (Normal Range: Negative)    Comments: Army Fossa CMA  September 10, 2010 8:32 AM

## 2010-12-15 NOTE — Progress Notes (Signed)
Summary: REFILL REQUEST  Phone Note Refill Request Call back at [phone] Message from:  Pharmacy on May 01, 2010 2:26 PM  Refills Requested: Medication #1:  AMBIEN 5 MG  TABS Take one tablet at bedtime   Dosage confirmed as above?Dosage Confirmed   Supply Requested: 1 month   Last Ref[facility]: [facility]/2010 [facility] [address]  Next Appointment Scheduled: SEPT 30TH 2011 Initial call taken by: Lavell Islam,  May 01, 2010 2:27 PM  Follow-up for Phone Call        last filled 10-06-09 #30 6, last OV5-31-11.....Marland KitchenMarland KitchenFelecia Deloach CMA  May 01, 2010 4:19 PM   ok 30 and 6 RF Shelbie Franken E. Navi Ewton MD  May 04, 2010 11:49 AM     Prescriptions: AMBIEN 5 MG  TABS (ZOLPIDEM TARTRATE) Take one tablet at bedtime  #30 x 6   Entered by:   Jeremy Johann CMA   Authorized by:   Nolon Rod. Leshawn Straka MD   Signed by:   Jeremy Johann CMA on 05/05/2010   Method used:   Printed then faxed to ...       [facility]  [address] [phone]* (retail)       [address]       [place], [place]  [address]       Ph: [phone] or [phone]       Fax: [phone]   RxID:   [id number]

## 2010-12-15 NOTE — Letter (Signed)
Summary: [facility]  [facility]   Imported By: Lanelle Bal 08/11/2010 13:53:24  _____________________________________________________________________  External Attachment:    Type:   Image     Comment:   External Document  Appended Document: [facility] has neuropathy, started gabapentin gout well-controlled without any medicines History of CPPD date go to a comprehensive metabolic panel, uric acid and CBC

## 2010-12-15 NOTE — Progress Notes (Signed)
Summary: results  Phone Note Outgoing Call Call back at [facility] Phone [phone]   Reason for Call: Discuss lab or test results Details for Reason: advise patient: diabetes is well controlled her cholesterol is okay,he used to be even better. I recommend to watch her diet and try to exercise as much as she can All other labs normal Send a copy to Dr. Victory Dakin Signed by Nolon Rod. Paz MD on 04/14/2010 at 2:38 PM  Summary of Call:  - copy faxed  - left message on machine for pt to return call......Marland KitchenShary Decamp  Apr 14, 2010 3:25 PM left message to call office.Marland KitchenMarland KitchenMarland KitchenFelecia Deloach CMA  April 15, 2010 10:40 AM    Follow-up for Phone Call        DISCUSS WITH PATIENT .................Marland KitchenFelecia Deloach CMA  April 15, 2010 11:56 AM

## 2010-12-15 NOTE — Progress Notes (Signed)
Summary: METFORMIN REFILL  Phone Note Refill Request Message from:  Fax from Pharmacy on August 07, 2010 2:32 PM  Refills Requested: Medication #1:  METFORMIN HCL 500 MG  TABS Take one tablet twice daily [facility], [address], [place], [place]  [place] [phone]    CALL DIRECTLY = [phone]  Initial call taken by: Jerolyn Shin,  August 07, 2010 2:38 PM    Prescriptions: METFORMIN HCL 500 MG  TABS (METFORMIN HCL) Take one tablet twice daily  #180 x 1   Entered by:   Doristine Devoid CMA   Authorized by:   Nolon Rod. Paz MD   Signed by:   Doristine Devoid CMA on 08/07/2010   Method used:   Reprint   RxID:   [id number]

## 2010-12-15 NOTE — Assessment & Plan Note (Signed)
Summary: diabetic check/cbs   Vital Signs:  Patient profile:   65 year old female Height:      63 inches Weight:      149 pounds Pulse rate:   80 / minute BP sitting:   120 / 80  Vitals Entered By: Shary Decamp (Apr 14, 2010 8:25 AM) CC: rov, fasting Is Patient Diabetic? Yes Comments  - FBS this am was 156, avg fasting blood sugar has been 112-113  - scheduled for eye exam today  - scheduled for colonoscopy 09/2010  - labs need to be faxed to Dr. Corliss Skains @ [phone] .Marland KitchenMarland KitchenShary Decamp  Apr 14, 2010 8:31 AM    History of Present Illness: ROV Acute issue: Developed a discomfort on the left lower quadrant of the abdomen a week ago, symptoms self resolved in 24 hours. Symptoms remind her of her episode of diverticulitis few years ago. Similar symptoms last night. Asymptomatic this morning. See review of systems. DM:  - FBS this am was 156. Her sugar has been slightly higher in the last few days  - scheduled for eye exam today routine care: scheduled for colonoscopy 09/2010 OA  her rheumatology,labs need to be faxed to Dr. Corliss Skains @ [phone] Osteopenia  She took calcium for one week, got extremely constipated, she self discontinued it a month ago  Current Medications (verified): 1)  Cartia Xt 300 Mg  Cp24 (Diltiazem Hcl Coated Beads) .... Take One Capsule Daily 2)  Cozaar 100 Mg Tabs (Losartan Potassium) .... As Directed 3)  Metformin Hcl 500 Mg  Tabs (Metformin Hcl) .... Take One Tablet Twice Daily 4)  Ambien 5 Mg  Tabs (Zolpidem Tartrate) .... Take One Tablet At Bedtime 5)  Sulindac 150 Mg  Tabs (Sulindac) .... Take 1 Twice Daily 6)  Claritin 10 Mg  Caps (Loratadine) .... Qd 7)  Adult Aspirin Ec Low Strength 81 Mg  Tbec (Aspirin) 8)  Freestyle Lancets   Misc (Lancets) .... Use As Directed 1-2 Times Daily 9)  Freestyle Test   Strp (Glucose Blood) .... Use As Directed 1-2 Times Daily 10)  Fish Oil 11)  D2 2000 Iu 12)  Astatathin  Allergies (verified): 1)  !  Celebrex  Past History:  Past Medical History: Diabetes mellitus, type II Hyperlipidemia Hypertension Osteoarthritis sees Dr Victory Dakin recurrent boils, h/o MRSA 06-2007: CP and QW on EKG : neg stress test h/o Gout Osteopenia pewr DEXA 4-10 Diverticulitis, hx of (aprox 2000  Social History: Reviewed history from 10/14/2009 and no changes required. 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   Lungs:  normal respiratory effort, no intercostal retractions, no accessory muscle use, and normal breath sounds.   Heart:  normal rate, regular rhythm, and no murmur.   Abdomen:  soft, normal bowel sounds, no distention, no masses, no guarding, no rigidity, and no rebound tenderness.  mildly tender at the lower abdomen, left > Right Extremities:   no lower extremity edema Psych:  Cognition and judgment appear intact. Alert and cooperative with normal attention span and concentration.     Impression & Recommendations:  Problem # 1:  ABDOMINAL PAIN, LOWER (ICD-789.09) LLQ abdominal pain, history of diverticulitis, no urinary symptoms at present. UA consistent  with UTI, urine culture pending Plan: Will prescribe both Cipro and Flagyl to cover for possible UTI and diverticulitis to call immediately if symptoms of severe or resurface  Her updated medication list for this problem includes:    Sulindac 150 Mg Tabs (Sulindac) .Marland Kitchen... Take 1 twice daily    Adult Aspirin Ec Low Strength 81 Mg Tbec (Aspirin)  Orders: Venipuncture (16109) TLB-CBC Platelet - w/Differential (85025-CBCD)  Problem # 2:  OSTEOPENIA (ICD-733.90) intolerant to calcium due to constipation Continue vitamin D Encouraged high calcium  diet  Problem # 3:  DIABETES MELLITUS, TYPE II (ICD-250.00) CBGs slightly elevated lately, due to #1?.  Labs Her updated medication list for this problem includes:    Cozaar 100 Mg Tabs (Losartan potassium) .Marland Kitchen... As directed    Metformin Hcl 500 Mg Tabs (Metformin hcl) .Marland Kitchen... Take one tablet twice daily    Adult Aspirin Ec Low Strength 81 Mg Tbec (Aspirin)  Labs Reviewed: Creat: 0.71 (10/03/2009)    Reviewed HgBA1c results: 6.2 (10/14/2009)  6.2 (05/14/2009)  Orders: TLB-Microalbumin/Creat Ratio, Urine (82043-MALB) TLB-A1C / Hgb A1C (Glycohemoglobin) (83036-A1C)  Problem # 4:  HYPERLIPIDEMIA (ICD-272.4) labs The following medications were removed from the medication list:    Lipitor 20 Mg Tabs (Atorvastatin calcium) .Marland Kitchen... As directed  Labs Reviewed: SGOT: 15 (10/03/2009)   SGPT: 15 (10/03/2009)  Lipid Goals: Chol Goal: 200 (07/07/2007)   HDL Goal: 40 (07/07/2007)   LDL Goal: 100 (07/07/2007)   TG Goal: 150 (07/07/2007)  Prior 10 Yr Risk Heart Disease: 9 % (07/07/2007)   HDL:73.3 (01/10/2009), 72.2 (09/13/2008)  LDL:71 (01/10/2009), 57 (09/13/2008)  Chol:156 (01/10/2009), 138 (09/13/2008)  Trig:61 (01/10/2009), 43 (09/13/2008)  Orders: TLB-Lipid Panel (80061-LIPID)  Problem # 5:  HYPERTENSION (ICD-401.9) at goal  Her updated medication list for this problem includes:    Cartia Xt 300 Mg Cp24 (Diltiazem hcl coated beads) .Marland Kitchen... Take one capsule daily    Cozaar 100 Mg Tabs (Losartan potassium) .Marland Kitchen... As directed  BP today: 120/80 Prior BP: 130/80 (10/14/2009)  Prior 10 Yr Risk Heart Disease: 9 % (07/07/2007)  Labs Reviewed: K+: 4.1 (10/03/2009) Creat: : 0.71 (10/03/2009)   Chol: 156 (01/10/2009)   HDL: 73.3 (01/10/2009)   LDL: 71 (01/10/2009)   TG: 61 (01/10/2009)  Orders: TLB-BMP (Basic Metabolic Panel-BMET) (80048-METABOL)  Problem # 6:  OSTEOARTHRITIS (ICD-715.90) we'll send labs to Dr Victory Dakin Her updated medication list for this problem includes:    Sulindac  150 Mg Tabs (Sulindac) .Marland Kitchen... Take 1 twice daily    Adult Aspirin Ec Low Strength 81 Mg Tbec (Aspirin)  Problem # 7:  HEALTH SCREENING (ICD-V70.0) due for a physical, recommend to  return to the office in 4 months To have a colonoscopy soon  Complete Medication List: 1)  Cartia Xt 300 Mg Cp24 (Diltiazem hcl coated beads) .... Take one capsule daily 2)  Cozaar 100 Mg Tabs (Losartan potassium) .... As directed 3)  Metformin Hcl 500 Mg Tabs (Metformin hcl) .... Take one tablet twice daily 4)  Ambien 5 Mg Tabs (Zolpidem tartrate) .... Take one tablet at bedtime 5)  Sulindac 150 Mg Tabs (Sulindac) .... Take 1 twice daily 6)  Claritin 10 Mg Caps (Loratadine) .... Qd 7)  Adult Aspirin Ec Low Strength 81 Mg Tbec (Aspirin) 8)  Freestyle Lancets Misc (Lancets) .... Use as directed 1-2 times daily 9)  Freestyle Test Strp (Glucose blood) .... Use as directed 1-2 times daily 10)  Fish Oil  11)  D2 2000 Iu  12)  Astatathin  13)  Ciprofloxacin Hcl 500 Mg Tabs (Ciprofloxacin hcl) .... One by mouth twice a day for one week 14)  Metronidazole 500 Mg Tabs (Metronidazole) .... One by mouth 4 times a day for one week  Other Orders: Specimen Handling (04540) T-Urine Microscopic (98119-14782) T-Culture, Urine (95621-30865) UA Dipstick w/o Micro (manual) (78469)  Patient Instructions: 1)  Please schedule a follow-up appointment in 4 months  ( physical exam) Prescriptions: METRONIDAZOLE 500 MG TABS (METRONIDAZOLE) one by mouth 4 times a day for one week  #28 x 0   Entered and Authorized by:   Nolon Rod. Paz MD   Signed by:   Nolon Rod. Paz MD on 04/14/2010   Method used:   [facility]        [facility]  [address] [phone]* (retail)       [address]       [place], [place]  [address]       Ph: [phone] or [phone]       Fax: [phone]   RxID:   [id number] CIPROFLOXACIN HCL 500 MG TABS (CIPROFLOXACIN HCL) one by mouth twice a day for one week  #14 x 0    Entered and Authorized by:   Nolon Rod. Paz MD   Signed by:   Nolon Rod. Paz MD on 04/14/2010   Method used:   [facility]        [facility]  [address] [phone]* (retail)       [address]       [place], [place]  [address]       Ph: [phone] or [phone]       Fax: [phone]   RxID:   [id number]   Laboratory Results   Urine Tests    Routine Urinalysis   Glucose: negative   (Normal Range: Negative) Bilirubin: negative   (Normal Range: Negative) Ketone: negative   (Normal Range: Negative) Spec. Gravity: 1.020   (Normal Range: 1.003-1.035) Blood: negative   (Normal Range: Negative) pH: 5.0   (Normal Range: 5.0-8.0) Protein: negative   (Normal Range: Negative) Urobilinogen: 0.2   (Normal Range: 0-1) Nitrite: negative   (Normal Range: Negative) Leukocyte Esterace: moderate   (Normal Range: Negative)

## 2010-12-15 NOTE — Assessment & Plan Note (Signed)
Summary: --physical exam--cbs   Vital Signs:  Patient profile:   65 year old female Height:      64 inches Weight:      150.25 pounds BMI:     25.88 Pulse rate:   87 / minute Pulse rhythm:   regular BP sitting:   128 / 84  (left arm) Cuff size:   regular  Vitals Entered By: Army Fossa CMA (August 14, 2010 8:53 AM) CC: CPX, fasting Comments Got flu shot monday Scheduled for a Colonoscopy in Nov. Not had a recent PAP Discuss TD   History of Present Illness: CPX note from rheumatology reviewed had feet pain, rheumatology dx w/ neuropathy, rx gabapentin ----> (+) help  ambulatory CBGs in the 120s in AM or lower   Preventive Screening-Counseling & Management  Caffeine-Diet-Exercise     Does Patient Exercise: no  Allergies: 1)  ! Celebrex  Past History:  Past Medical History: Diabetes w/ neuropathy, on neurontin since 07-2010 Hyperlipidemia Hypertension Osteoarthritis, gout, CPPD ------sees Dr Victory Dakin  Osteopenia per DEXA 4-10 recurrent boils, h/o MRSA 06-2007: CP and QW on EKG : neg stress test Diverticulitis, hx of (aprox 2000)  Past Surgical History: Reviewed history from 09/12/2008 and no changes required. Cholecystectomy Tonsillectomy  Family History: Reviewed history from 09/12/2008 and no changes required. 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 Resp:  Denies cough and wheezing. GI:  Denies bloody stools, diarrhea, nausea, and vomiting. GU:  Denies dysuria and hematuria. Psych:  Denies anxiety and  depression.  Physical Exam  General:  alert, well-developed, and well-nourished.   Neck:  no masses, no thyromegaly, and normal carotid upstroke.   Lungs:  normal respiratory effort, no intercostal retractions, no accessory muscle use, and normal breath sounds.   Heart:  normal rate, regular rhythm, and no murmur.   Abdomen:  soft, non-tender, normal bowel sounds, no distention, no masses, no guarding, and no rigidity.   Extremities:  no pretibial edema bilaterally  Neurologic:  alert & oriented X3, cranial nerves II-XII intact, and gait normal.   Psych:  not anxious appearing and not depressed appearing.     Impression & Recommendations:  Problem # 1:  HEALTH SCREENING (ICD-V70.0) chart reviewed Td  ?<10 years per patient, gave tetanus shot today pneumonia shot 2008 aprox per patient got a  flu shot already  shingles immunization discussed, printed material provided  got a comprehensive metabolic panel, CBC and a uric acid few days ago @ Rheumatology. Will get records  Cscope 2006, next due November 2011  has not seen gyn in years , aware needs a PAP ; I'll refer to Dr Trudie Buckler  (patient out of town october 17)  mammogram  neg  04/2010  discussed diet and exercise  Orders: Venipuncture (16109) T-Vitamin D (25-Hydroxy) [phone]) TLB-Lipid Panel (80061-LIPID) TLB-TSH (Thyroid Stimulating Hormone) (84443-TSH) Specimen Handling (91478) Gynecologic Referral (Gyn)  Problem # 2:  OSTEOPENIA (ICD-733.90) she took HRT for years last bone density test 01-2009, had mild  osteopenia with a T score of -1.1 check a vitamin D  Problem # 3:  DIABETES MELLITUS, TYPE II (ICD-250.00) labs Her updated medication list for this problem includes:    Cozaar 100 Mg Tabs (Losartan potassium) .Marland Kitchen... As directed    Metformin Hcl 500 Mg Tabs (Metformin hcl) .Marland Kitchen... Take one tablet twice daily    Adult Aspirin Ec Low Strength 81 Mg Tbec (Aspirin)  Labs Reviewed: Creat: 0.6 (04/14/2010)     Reviewed HgBA1c results: 6.2 (04/14/2010)  6.2 (10/14/2009)  Orders: TLB-A1C / Hgb A1C (Glycohemoglobin) (83036-A1C) Specimen Handling (16109)  Complete Medication List: 1)  Cartia Xt 300 Mg Cp24 (Diltiazem hcl coated beads) .... Take one capsule daily 2)  Cozaar 100 Mg Tabs (Losartan potassium) .... As directed 3)  Metformin Hcl 500 Mg Tabs (Metformin hcl) .... Take one tablet twice daily 4)  Ambien 5 Mg Tabs (Zolpidem tartrate) .... Take one tablet at bedtime 5)  Sulindac 150 Mg Tabs (Sulindac) .... Take 1 twice daily 6)  Claritin 10 Mg Caps (Loratadine) .... Qd 7)  Adult Aspirin Ec Low Strength 81 Mg Tbec (Aspirin) 8)  Freestyle Lancets Misc (Lancets) .... Use as directed 1-2 times daily 9)  Freestyle Test Strp (Glucose blood) .... Use as directed 1-2 times daily 10)  Fish Oil  11)  D2 2000 Iu  12)  Astatathin  13)  Gabapentin 300 Mg Caps (Gabapentin) .Marland Kitchen.. 1 by mouth at bedtime.  Other Orders: Tdap => 54yrs IM (60454) Admin 1st Vaccine (09811) Admin 1st Vaccine [facility]) [phone])  Patient Instructions: 1)  Please schedule a follow-up appointment in 4 months .  Prescriptions: COZAAR 100 MG TABS (LOSARTAN POTASSIUM) as directed  #90 x 3   Entered and Authorized by:   Elita Quick E. Paz MD   Signed by:   Nolon Rod. Paz MD on 08/14/2010   Method used:   Print then Give to Patient   RxID:   [id number] CARTIA XT 300 MG  CP24 (DILTIAZEM HCL COATED BEADS) Take one capsule daily  #90 x 3   Entered and Authorized by:   Nolon Rod. Paz MD   Signed by:   Nolon Rod. Paz MD on 08/14/2010   Method used:   Print then Give to Patient   RxID:   [id number]    Risk Factors:  Exercise:  no  Mammogram History:     Date of Last Mammogram:  01/13/2010    Results:  normal-per pt     Preventive Care Screening  Last Flu Shot:    Date:  08/10/2010    [facility]  [facility]   Mammogram:    Date:  01/13/2010    Results:  normal-per pt     Tetanus/Td Vaccine    Vaccine Type:  Tdap    Site: right deltoid    Mfr: [employer]oSmithK[employer]    Dose: 0.5 ml    Route: IM    Given by: Army Fossa CMA    Exp. Date: 09/03/2012    Lot #: [id number]     Appended Document: --physical exam--cbs labs from rheumatology ok

## 2010-12-15 NOTE — Letter (Signed)
Summary: [facility]  [facility]   Imported By: Lanelle Bal 12/23/2009 08:04:44  _____________________________________________________________________  External Attachment:    Type:   Image     Comment:   External Document

## 2010-12-15 NOTE — Letter (Signed)
Summary: Primary Care Appointment Letter   at [place]  [address] [place], [place] [address]   Phone: [phone]  Fax: [phone]    03/17/2010 MRN: [id number]  [name] [name] [address] [place], [place]  [address]  Dear [name],   Your Primary Care Physician Dixon E. Paz MD has indicated that:    ___x____it is time to schedule an appointment.  Please call our office @ [phone] to schedule an office visit with Dr. Drue Novel     Thank you,    [facility] Primary Care Scheduler

## 2010-12-15 NOTE — Progress Notes (Signed)
Summary: refill  Phone Note Refill Request Message from:  Fax from Pharmacy on August 05, 2010 1:16 PM  Refills Requested: Medication #1:  METFORMIN HCL 500 MG  TABS Take one tablet twice daily [facility])fax [phone]  Initial call taken by: Okey Regal [place],  August 05, 2010 1:18 PM    Prescriptions: METFORMIN HCL 500 MG  TABS (METFORMIN HCL) Take one tablet twice daily  #180 x 0   Entered by:   Army Fossa CMA   Authorized by:   Nolon Rod. Paz MD   Signed by:   Army Fossa CMA on 08/05/2010   Method used:   Reprint   RxID:   [id number] METFORMIN HCL 500 MG  TABS (METFORMIN HCL) Take one tablet twice daily  #180 x 0   Entered and Authorized by:   Army Fossa CMA   Signed by:   Army Fossa CMA on 08/05/2010   Method used:   Print then Give to Patient   RxID:   [id number]

## 2010-12-15 NOTE — Miscellaneous (Signed)
Summary: labs from [facility]   Clinical Lists Changes  Observations: Added new observation of SGPT (ALT): 12 units/L (07/28/2010 11:10) Added new observation of SGOT (AST): 14 units/L (07/28/2010 11:10) Added new observation of URIC ACID: 5.1 mg/dL (91/47/8295 62:13) Added new observation of GLUCOSE SER: 129 mg/dL (08/65/7846 96:29) Added new observation of CREATININE: 0.70 mg/dL (52/84/1324 40:10) Added new observation of POTASSIUM: 4.2 mmol/L (07/28/2010 11:10) Added new observation of PLATELETS: 314 10*3/mm3 (07/28/2010 11:10) Added new observation of HGB: 12.4 g/dL (27/25/3664 40:34) Added new observation of WBC: 4.5 10*3/mm3 (07/28/2010 11:10)

## 2010-12-15 NOTE — Progress Notes (Signed)
Summary: Possible UTI/appt needed  Phone Note Call from Patient Call back at [facility] Phone [phone]   Summary of Call: Patient left message on triage c/o frequent urination and possibe uti.  Left message on voicemail to call back to office and schedule appt. Initial call taken by: Lucious Groves CMA,  September 09, 2010 10:09 AM  Follow-up for Phone Call        patient called back--cant come this afternoon, gave her appt with Dr Drue Novel thursday morning 10/27 Follow-up by: Jerolyn Shin,  September 09, 2010 10:39 AM  Additional Follow-up for Phone Call Additional follow up Details #1::        Closed note.  Additional Follow-up by: Lucious Groves CMA,  September 09, 2010 11:22 AM

## 2010-12-15 NOTE — Progress Notes (Signed)
Summary: lab result  Phone Note Outgoing Call   Details for Reason: Will continue with both Cipro and Flagyl) advise patient: Continue with antibiotics, she did have a UTI. Patient to let us know if the pain is not improving or if it comes bac Summary of Call: left message to call  office................Marland KitchenFelecia Deloach CMA  April 20, 2010 10:26 AM   Follow-up for Phone Call        DISCUSS WITH PATIENT..................Marland KitchenFelecia Deloach CMA  April 21, 2010 9:39 AM

## 2010-12-17 NOTE — Progress Notes (Signed)
Summary: REFILL  Phone Note Refill Request Call back at [phone] Message from:  Pharmacy on November 06, 2010 1:00 PM  Refills Requested: Medication #1:  METFORMIN HCL 500 MG  TABS Take one tablet twice daily   Dosage confirmed as above?Dosage Confirmed   Supply Requested: 3 months [facility] [place]) [address] [address]  Next Appointment Scheduled: 12/22/10 Initial call taken by: Lavell Islam,  November 06, 2010 1:01 PM  Follow-up for Phone Call        Faxed to the above #... Almeta Monas CMA (AAMA)  November 06, 2010 1:21 PM     Prescriptions: METFORMIN HCL 500 MG  TABS (METFORMIN HCL) Take one tablet twice daily  #180 x 1   Entered by:   Almeta Monas CMA (AAMA)   Authorized by:   Nolon Rod. Paz MD   Signed by:   Almeta Monas CMA (AAMA) on 11/06/2010   Method used:   Printed then faxed to ...       [facility]  [address] [phone]ail[facility]       [address]       [place], [place]  [id number]       Ph: [phone] or [phone]       Fax: [phone]   RxID:   [id number]   Appended Document: REFILL    Phone Note Outgoing Call   Summary of Call: This needs to be faxed to the [facility].  Initial call taken by: Lavell Islam,  November 11, 2010 12:25 PM  Follow-up for Phone Call        I faxed it to the 800 number that was in the notes Follow-up by: Almeta Monas CMA Duncan Dull),  November 11, 2010 12:32 PM  Additional Follow-up for Phone Call Additional follow up Details #1::        Please refax it to the same number.Thanks Additional Follow-up by: Lavell Islam,  November 11, 2010 12:34 PM    Prescriptions: METFORMIN HCL 500 MG  TABS (METFORMIN HCL) Take one tablet twice daily  #180 x 1   Entered by:   Almeta Monas CMA (AAMA)   Authorized by:   Loreen Freud DO   Signed by:   Almeta Monas CMA (AAMA) on 11/11/2010   Method used:   Print then Give to Patient   RxID:   [id number]  Refaxed to  [phone]... Almeta Monas CMA Duncan Dull)  November 11, 2010 2:32 PM

## 2010-12-17 NOTE — Procedures (Signed)
Summary: Upper GI Endoscopy--stricture, dilated  Upper GI Endoscopy/[facility]   Imported By: Lanelle Bal 10/30/2010 12:52:27  _____________________________________________________________________  External Attachment:    Type:   Image     Comment:   External Document

## 2010-12-17 NOTE — Procedures (Signed)
Summary: Colonoscopy: tics, otherwhise neg, 5 years   Colonoscopy/[facility]   Imported By: Lanelle Bal 10/30/2010 12:53:18  _____________________________________________________________________  External Attachment:    Type:   Image     Comment:   External Document  Appended Document: Colonoscopy: tics, otherwhise neg, 5 years  enter in EMR  Appended Document: Colonoscopy: tics, otherwhise neg, 5 years      Clinical Lists Changes  Observations: Added new observation of COLONOSCOPY: tics, otherwhise neg, 5 years  (10/05/2010 15:44)       Preventive Care Screening  Colonoscopy:    Date:  10/05/2010    Results:  tics, otherwhise neg, 5 years

## 2010-12-22 ENCOUNTER — Ambulatory Visit (INDEPENDENT_AMBULATORY_CARE_PROVIDER_SITE_OTHER): Payer: 59 | Admitting: Internal Medicine

## 2010-12-22 ENCOUNTER — Encounter: Payer: Self-pay | Admitting: Internal Medicine

## 2010-12-22 ENCOUNTER — Other Ambulatory Visit: Payer: Self-pay | Admitting: Internal Medicine

## 2010-12-22 DIAGNOSIS — I10 Essential (primary) hypertension: Secondary | ICD-10-CM

## 2010-12-22 DIAGNOSIS — R350 Frequency of micturition: Secondary | ICD-10-CM

## 2010-12-22 DIAGNOSIS — E119 Type 2 diabetes mellitus without complications: Secondary | ICD-10-CM

## 2010-12-22 DIAGNOSIS — E785 Hyperlipidemia, unspecified: Secondary | ICD-10-CM

## 2010-12-22 LAB — BASIC METABOLIC PANEL
BUN: 17 mg/dL (ref 6–23)
CO2: 29 mEq/L (ref 19–32)
Chloride: 107 mEq/L (ref 96–112)
GFR: 106.75 mL/min (ref >60.00)
Glucose, Bld: 123 mg/dL — ABNORMAL HIGH (ref 70–99)
Potassium: 4.2 mEq/L (ref 3.5–5.1)
Sodium: 142 mEq/L (ref 135–145)

## 2010-12-22 LAB — CONVERTED CEMR LAB
Bilirubin Urine: NEGATIVE
Blood in Urine, dipstick: NEGATIVE
Glucose, Urine, Semiquant: NEGATIVE
Ketones, urine, test strip: NEGATIVE
Protein, U semiquant: NEGATIVE
pH: 5

## 2010-12-31 NOTE — Assessment & Plan Note (Signed)
Summary: 4 MONTH ROV/CBS      Allergies Added:  Nurse Visit   Vital Signs:  Patient profile:   65 year old female Weight:      147.25 pounds Pulse rate:   80 / minute Pulse rhythm:   regular BP sitting:   124 / 76  (left arm) Cuff size:   regular  Vitals Entered By: Army Fossa CMA (December 22, 2010 8:25 AM) CC: 4 month f/u- fasting  Comments [facility] aching [facility] [address]    Past History:  Past Medical History: Reviewed history from 08/14/2010 and no changes required. Diabetes w/ neuropathy, on neurontin since 07-2010 Hyperlipidemia Hypertension Osteoarthritis, gout, CPPD ------sees Dr Victory Dakin  Osteopenia per DEXA 4-10 recurrent boils, h/o MRSA 06-2007: CP and QW on EKG : neg stress test Diverticulitis, hx of (aprox 2000)  Past Surgical History: Reviewed history from 09/12/2008 and no changes required. Cholecystectomy Tonsillectomy   Current Medications (verified): 1)  Cartia Xt 300 Mg  Cp24 (Diltiazem Hcl Coated Beads) .... Take One Capsule Daily 2)  Cozaar 100 Mg Tabs (Losartan Potassium) .... As Directed 3)  Metformin Hcl 500 Mg  Tabs (Metformin Hcl) .... Take One Tablet Twice Daily 4)  Ambien 5 Mg  Tabs (Zolpidem Tartrate) .... Take One Tablet At Bedtime 5)  Sulindac 150 Mg  Tabs (Sulindac) .... Take 1 Twice Daily 6)  Claritin 10 Mg  Caps (Loratadine) .... Qd 7)  Adult Aspirin Ec Low Strength 81 Mg  Tbec (Aspirin) 8)  Freestyle Lancets   Misc (Lancets) .... Use As Directed 1-2 Times Daily 9)  Freestyle Test   Strp (Glucose Blood) .... Use As Directed 1-2 Times Daily 10)  Fish Oil 11)  D2 2000 Iu 12)  Astatathin  Allergies (verified): 1)  ! Celebrex  Social History: Reviewed history from 08/14/2010 and no changes required. 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   Neck:  no masses, no thyromegaly, and no cervical lymphadenopathy.   Breasts:  No mass, nodules, thickening, tenderness, bulging, retraction, inflamation, nipple discharge or skin changes noted.  no axillary lymphadenopathy Lungs:  normal respiratory effort, no intercostal retractions, no accessory muscle use, and normal breath sounds.   Heart:  normal rate, regular rhythm, and no murmur.   Extremities:  no pretibial edema bilaterally    Impression & Recommendations:  Problem # 1:  FREQUENCY, URINARY (ICD-788.41) occasional urinary frequency, urinalysis negative.  Orders: UA Dipstick w/o Micro (automated)  (81003)  Problem # 2:  DIABETES MELLITUS, TYPE II (ICD-250.00) due for labs, exercise encourage  Her updated medication list for this problem includes:    Cozaar 100 Mg Tabs (Losartan potassium) .Marland Kitchen... As directed    Metformin Hcl 500 Mg Tabs (Metformin hcl) .Marland Kitchen... Take one tablet twice daily    Adult Aspirin Ec Low Strength 81 Mg Tbec (Aspirin)  Labs Reviewed: Creat: 0.70 (07/28/2010)    Reviewed HgBA1c results: 6.4 (08/14/2010)  6.2 (04/14/2010)  Orders: Venipuncture ([address]) TLB-A1C / Hgb A1C (Glycohemoglobin) (83036-A1C) TLB-ALT (SGPT) (84460-ALT) TLB-AST (SGOT) (84450-SGOT) Specimen Handling (60[address])  Problem # 3:  HYPERLIPIDEMIA (ICD-272.4) on no medicines, her LDL has been usually very good, the last time it was slightly above 100 plan... diet and exercise for now  Labs Reviewed:  SGOT: 14 (07/28/2010)   SGPT: 12 (07/28/2010)  Prior 10 Yr Risk Heart Disease: 9 % (07/07/2007)   HDL:79.90 (08/14/2010), 74.40 (04/14/2010)  LDL:108 (08/14/2010), 109 (04/14/2010)  Chol:198 (08/14/2010), 197 (04/14/2010)  Trig:53.0 (08/14/2010), 66.0 (04/14/2010)  Problem # 4:  Breast soreness exam negative Encourage self breast exam Last mammogram 04/2010, negative Recommend to see her gynecologist routinely  Problem # 5:  HYPERTENSION  (ICD-401.9) at goal  Her updated medication list for this problem includes:    Cartia Xt 300 Mg Cp24 (Diltiazem hcl coated beads) .Marland Kitchen... Take one capsule daily    Cozaar 100 Mg Tabs (Losartan potassium) .Marland Kitchen... As directed  Orders: TLB-BMP (Basic Metabolic Panel-BMET) (80048-METABOL)  BP today: 124/76 Prior BP: 122/84 (09/10/2010)  Prior 10 Yr Risk Heart Disease: 9 % (07/07/2007)  Labs Reviewed: K+: 4.2 (07/28/2010) Creat: : 0.70 (07/28/2010)   Chol: 198 (08/14/2010)   HDL: 79.90 (08/14/2010)   LDL: 108 (08/14/2010)   TG: 53.0 (08/14/2010)  Complete Medication List: 1)  Cartia Xt 300 Mg Cp24 (Diltiazem hcl coated beads) .... Take one capsule daily 2)  Cozaar 100 Mg Tabs (Losartan potassium) .... As directed 3)  Metformin Hcl 500 Mg Tabs (Metformin hcl) .... Take one tablet twice daily 4)  Ambien 5 Mg Tabs (Zolpidem tartrate) .... Take one tablet at bedtime 5)  Sulindac 150 Mg Tabs (Sulindac) .... Take 1 twice daily 6)  Claritin 10 Mg Caps (Loratadine) .... Qd 7)  Adult Aspirin Ec Low Strength 81 Mg Tbec (Aspirin) 8)  Freestyle Lancets Misc (Lancets) .... Use as directed 1-2 times daily 9)  Freestyle Test Strp (Glucose blood) .... Use as directed 1-2 times daily 10)  Fish Oil  11)  D2 2000 Iu  12)  Astatathin    Patient Instructions: 1)  Please schedule a follow-up appointment in 4 months .    Review of Systems CV:  Denies chest pain or discomfort and swelling of feet. Resp:  Denies cough, sputum productive, and wheezing. GU:  vocational frequency for long time. No dysuria or gross hematuria.   History of Present Illness: ROV  left breast was sore for a few days last month, symptoms subsided already denies nipple discharge or bleeding, self breast exam showed no lumps Diabetes--  ambulatory CBGs  ~120 insomnia, on ambien as needed, does help Hypertension-- ambulatory BPs wnl, good medication compliance     Laboratory Results   Urine Tests    Routine Urinalysis    Color: yellow Appearance: Clear Glucose: negative   (Normal Range: Negative) Bilirubin: negative   (Normal Range: Negative) Ketone: negative   (Normal Range: Negative) Spec. Gravity: 1.020   (Normal Range: 1.003-1.035) Blood: negative   (Normal Range: Negative) pH: 5.0   (Normal Range: 5.0-8.0) Protein: negative   (Normal Range: Negative) Urobilinogen: 0.2   (Normal Range: 0-1) Nitrite: negative   (Normal Range: Negative) Leukocyte Esterace: negative   (Normal Range: Negative)    Comments: Army Fossa CMA  December 22, 2010 9:37 AM     Orders Added: 1)  Venipuncture [36415] 2)  TLB-A1C / Hgb A1C (Glycohemoglobin) [83036-A1C] 3)  TLB-BMP (Basic Metabolic Panel-BMET) [80048-METABOL] 4)  TLB-ALT (SGPT) [84460-ALT] 5)  TLB-AST (SGOT) [84450-SGOT] 6)  Specimen Handling [99000] 7)  UA Dipstick w/o Micro (automated)  [81003] 8)  Est. Patient Level IV [16109]

## 2011-01-06 ENCOUNTER — Encounter: Payer: Self-pay | Admitting: Internal Medicine

## 2011-01-26 NOTE — Letter (Signed)
Summary: [facility]  [facility]   Imported By: Maryln Gottron 01/14/2011 15:06:01  _____________________________________________________________________  External Attachment:    Type:   Image     Comment:   External Document

## 2011-02-27 ENCOUNTER — Other Ambulatory Visit: Payer: Self-pay | Admitting: Internal Medicine

## 2011-03-09 ENCOUNTER — Other Ambulatory Visit: Payer: Self-pay | Admitting: *Deleted

## 2011-03-09 MED ORDER — METFORMIN HCL 500 MG PO TABS: 500.0000 mg | ORAL_TABLET | Freq: Two times a day (BID) | ORAL | Status: DC

## 2011-03-30 NOTE — Assessment & Plan Note (Signed)
[facility]                            CARDIOLOGY OFFICE NOTE   NAME:[name] [name], [name] [name]                       MRN:          [id number]  DATE:06/15/2007                            DOB:          [birth year]    PRIMARY CARE AND REFERRING PHYSICIAN:  Leanne Chang, M.D.  with  [facility].   REASON FOR REFERRAL:  Evaluation of chest pain and abnormal ECG.   CHIEF COMPLAINT:  Chest pain.   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  She  worked at [employer] back in 1978.  About 3 weeks she had an  episode of left sided chest pain which persisted off and on for about a  week.  This was not associated with exertion and was not associated with  any shortness of breath or palpitations.  She saw Dr. Blossom Hoops recently  and her electrocardiogram was abnormal with abnormal Q waves in V1 and  V2.  Because of the pain and abnormal ECG, she was referred for further  evaluation.   She does have significant risk factors for vascular disease.  She has  hypertension and hyperlipidemia, and diabetes.   Her past medical history significant for:  1. Previous gallbladder surgery.  2. Hypertension.  3. Hyperlipidemia.  4. Diabetes.   CURRENT MEDICATIONS:  Ambien, fish oil, Cozaar, Cartia, aspirin,  Lipitor, metformin, and Sulindac.   FAMILY HISTORY:  Positive for diabetes.  Her mother died at age 67 with  diabetes.  Her father died in his 50s with diabetes.  And, she has a  sister who died with renal failure on dialysis with diabetes.  She has  another brother who died of unknown cause.   SOCIAL HISTORY:  She works as a Geophysicist/field seismologist.  She does not smoke.  She is single and does not smoke.   REVIEW OF SYSTEMS:  Negative.   PHYSICAL EXAMINATION:  VITAL SIGNS:  Blood pressure is 126/77, the pulse  83 and regular.  NECK:  There was no  venous distention.  The carotid pulses were full  without bruits.  CHEST:  Clear.  CARDIAC:  Rhythm was regular.  I could hear no murmurs, gallops, or  clicks.  ABDOMEN:  Soft with normal bowel sounds.  There was no  hepatosplenomegaly.  EXTREMITIES:  Peripheral pulses were full.  There was no peripheral  edema.  MUSCULOSKELETAL:  Showed no deformities.  SKIN:  Warm and dry.  NEUROLOGIC:  Showed no focal neurologic signs.   An electrocardiogram showed borderline left axis deviation and Q waves  in lead V1 and V2.   IMPRESSION:  1. Chest pain and abnormal electrocardiogram rule out ischemia.  2. Type 2 diabetes.  3. Hypertension.  4. Hyperlipidemia.   RECOMMENDATIONS:  Ms. Rondel Baton symptoms are somewhat atypical for  ischemia but her electrocardiogram was abnormal and she has very  positive risk factors for vascular disease.  I think she should be  evaluated with an exercise/rest stress Myoview scan.  We will plan to  schedule this next week.  If her scan is normal, then I do not think she  will need any further evaluation.  We will be in touch with her by phone  after we have the results.     Bruce Elvera Lennox Juanda Chance, MD, [facility]  Electronically Signed    BRB/MedQ  DD: 06/15/2007  DT: 06/15/2007  Job #: [id number]   cc:   Leanne Chang, M.D.

## 2011-04-02 NOTE — Assessment & Plan Note (Signed)
[name]                                 ON-CALL NOTE   NAME:[name] [name]                         MRN:          [id number]  DATE:08/26/2007                            DOB:          [birth year]    PHONE NUMBER:  [phone].   SUBJECTIVE:  Patient reports that Dr. Blossom [name] requested a chest x-ray.  The x-ray results came back, and patient was told that it was negative,  but she needed to start prednisone.  When the patient went to the  pharmacy to see it, she did not see it called in.   ASSESSMENT/PLAN:  Looked on EMR for whether prescription was sent  electronically.  There is a phone note showing Dr. Laqueta [name] order for  prednisone 20 mg 3 tablets for 5 days, but it appears that it was never  called in by the nurse.  [name]     [name] Nora, MD  Electronically Signed    AB/MedQ  DD: 08/30/2007  DT: 08/30/2007  Job #: [id number]

## 2011-04-29 ENCOUNTER — Encounter: Payer: Self-pay | Admitting: Internal Medicine

## 2011-05-03 ENCOUNTER — Encounter: Payer: Self-pay | Admitting: Internal Medicine

## 2011-05-03 ENCOUNTER — Ambulatory Visit (INDEPENDENT_AMBULATORY_CARE_PROVIDER_SITE_OTHER): Payer: 59 | Admitting: Internal Medicine

## 2011-05-03 DIAGNOSIS — I10 Essential (primary) hypertension: Secondary | ICD-10-CM

## 2011-05-03 DIAGNOSIS — M899 Disorder of bone, unspecified: Secondary | ICD-10-CM

## 2011-05-03 DIAGNOSIS — M1990 Unspecified osteoarthritis, unspecified site: Secondary | ICD-10-CM

## 2011-05-03 DIAGNOSIS — L723 Sebaceous cyst: Secondary | ICD-10-CM | POA: Insufficient documentation

## 2011-05-03 DIAGNOSIS — E785 Hyperlipidemia, unspecified: Secondary | ICD-10-CM

## 2011-05-03 DIAGNOSIS — E119 Type 2 diabetes mellitus without complications: Secondary | ICD-10-CM

## 2011-05-03 LAB — LIPID PANEL
Cholesterol: 212 mg/dL — ABNORMAL HIGH (ref 0–200)
Triglycerides: 65 mg/dL (ref 0.0–149.0)

## 2011-05-03 NOTE — Assessment & Plan Note (Signed)
On no meds, labs 

## 2011-05-03 NOTE — Progress Notes (Signed)
  Subjective:    Patient ID: [name] [name], female    DOB: [birth year], 65 y.o.   MRN: [id number]  HPI ROV DJD-- cont w/ feet pain , mostly when she walks. Neuropathy type of pain not a major issue at this point DM-- CBGs < 140 HTN-- amb BPs wnl, good med compliance  Also reports a SQ sebaceous cyst x years at the R hip, it was prominent, in the past was recommended not to excise it: a month ago it "fell off", no pain, no major bleed, the skin over the cyst was unchanged .  Past Medical History  Diagnosis Date  . Diabetes mellitus     w/ neuropathy  . Hyperlipidemia   . Hypertension   . Osteoarthritis     gout, CPPD--- sees Dr.Davenshwar  . Osteopenia     per DEXA 4/10  . Recurrent boils     h/o MRSA  . Chest pain 06/2007    CP and QW on EKG: neg stress test   . Diverticulitis 2000   Past Surgical History  Procedure Date  . Cholecystectomy   . Tonsillectomy      Review of Systems  Respiratory: Negative for cough and shortness of breath.   Cardiovascular: Negative for chest pain.       L>R LE edema some days, usually at the end of the day No DOE-PN dyspnea   Gastrointestinal: Negative for abdominal pain and blood in stool.       Objective:   Physical Exam  Constitutional: She appears well-developed and well-nourished.  Cardiovascular: Normal rate and normal heart sounds.   No murmur heard.      +/+++ feet swelling, dorsum of the feet only  Pulmonary/Chest: Effort normal and breath sounds normal. No respiratory distress. She has no wheezes.  Skin:             Assessment & Plan:

## 2011-05-03 NOTE — Assessment & Plan Note (Signed)
On diet control, labs 

## 2011-05-03 NOTE — Assessment & Plan Note (Addendum)
At goal , has mild LE edema: low salt, leg elevation

## 2011-05-03 NOTE — Assessment & Plan Note (Signed)
C/o feet pain, sx don't seem to be neuropatic (denies burning, etc) F/u by Rheumatology

## 2011-05-03 NOTE — Assessment & Plan Note (Signed)
rec ca and vit D

## 2011-05-03 NOTE — Assessment & Plan Note (Signed)
Had a cyst @ R hip x years, it spontaneously fell. Wound looks bwenign. rec observation, to call if area turns dark-hard

## 2011-05-03 NOTE — Patient Instructions (Signed)
Low salt diet-elevate legs 30 min 3 times a day

## 2011-05-03 NOTE — Progress Notes (Signed)
  Subjective:    Patient ID: [name] [name], female    DOB: June 03, 1946, 65 y.o.   MRN: [id number]  HPI    Review of Systems     Objective:   Physical Exam        Assessment & Plan:

## 2011-05-06 ENCOUNTER — Telehealth: Payer: Self-pay | Admitting: *Deleted

## 2011-05-06 ENCOUNTER — Encounter: Payer: Self-pay | Admitting: *Deleted

## 2011-05-06 MED ORDER — PRAVASTATIN SODIUM 20 MG PO TABS: 20.0000 mg | ORAL_TABLET | Freq: Every evening | ORAL | Status: DC

## 2011-05-06 NOTE — Telephone Encounter (Signed)
Pt is aware, has lab appt in 6 weeks.

## 2011-05-06 NOTE — Telephone Encounter (Signed)
Message left for patient to return my call.  

## 2011-05-06 NOTE — Telephone Encounter (Signed)
Message copied by Leanne Lovely on Thu May 06, 2011  1:17 PM ------      Message from: Willow Ora E      Created: Wed May 05, 2011  7:27 PM       Advise patient:      Diabetes is stable with a hemoglobin A1c of 6.5.      Cholesterol continued to be slightly elevated, LDL is still higher than 100.      years ago she took Lipitor, she had some aches consequently I recommend Pravachol 20 mg one by mouth each bedtime, #30, 3 refills.      Hopefully she won't  have any aches with Pravachol.      Arrange for a FLP, AST, ALT in 6 weeks---- dx  hyperlipidemia

## 2011-05-07 ENCOUNTER — Other Ambulatory Visit: Payer: Self-pay | Admitting: Internal Medicine

## 2011-05-17 ENCOUNTER — Other Ambulatory Visit: Payer: Self-pay | Admitting: Internal Medicine

## 2011-05-17 MED ORDER — ZOLPIDEM TARTRATE 5 MG PO TABS: 5.0000 mg | ORAL_TABLET | Freq: Every evening | ORAL | Status: DC | PRN

## 2011-05-18 ENCOUNTER — Other Ambulatory Visit: Payer: Self-pay | Admitting: Internal Medicine

## 2011-05-18 DIAGNOSIS — Z1231 Encounter for screening mammogram for malignant neoplasm of breast: Secondary | ICD-10-CM

## 2011-05-28 ENCOUNTER — Ambulatory Visit: Payer: 59

## 2011-06-01 ENCOUNTER — Ambulatory Visit
Admission: RE | Admit: 2011-06-01 | Discharge: 2011-06-01 | Disposition: A | Discharge status: Home / Self Care | Payer: 59 | Source: Ambulatory Visit | Attending: Internal Medicine | Admitting: Internal Medicine

## 2011-06-01 DIAGNOSIS — Z1231 Encounter for screening mammogram for malignant neoplasm of breast: Secondary | ICD-10-CM

## 2011-06-07 ENCOUNTER — Other Ambulatory Visit: Payer: Self-pay | Admitting: *Deleted

## 2011-06-07 ENCOUNTER — Other Ambulatory Visit: Payer: Self-pay | Admitting: Internal Medicine

## 2011-06-07 MED ORDER — METFORMIN HCL 500 MG PO TABS: 500.0000 mg | ORAL_TABLET | Freq: Two times a day (BID) | ORAL | Status: DC

## 2011-06-16 ENCOUNTER — Other Ambulatory Visit: Payer: Self-pay | Admitting: Internal Medicine

## 2011-06-16 DIAGNOSIS — E785 Hyperlipidemia, unspecified: Secondary | ICD-10-CM

## 2011-06-17 ENCOUNTER — Other Ambulatory Visit (INDEPENDENT_AMBULATORY_CARE_PROVIDER_SITE_OTHER): Payer: 59

## 2011-06-17 DIAGNOSIS — E785 Hyperlipidemia, unspecified: Secondary | ICD-10-CM

## 2011-06-17 LAB — LIPID PANEL
Cholesterol: 192 mg/dL (ref 0–200)
LDL Cholesterol: 94 mg/dL (ref 0–99)

## 2011-06-17 LAB — AST: AST: 16 U/L (ref 0–37)

## 2011-06-21 ENCOUNTER — Telehealth: Payer: Self-pay | Admitting: *Deleted

## 2011-06-21 NOTE — Telephone Encounter (Signed)
Left message on machine about results. 

## 2011-06-21 NOTE — Telephone Encounter (Signed)
Message copied by Romualdo Bolk on Mon Jun 21, 2011 11:50 AM ------      Message from: Willow Ora E      Created: Mon Jun 21, 2011 11:33 AM       Advise patient:      Her cholesterol has improved, continue with Pravachol, follow up in October as planned

## 2011-09-02 ENCOUNTER — Ambulatory Visit (INDEPENDENT_AMBULATORY_CARE_PROVIDER_SITE_OTHER): Payer: 59 | Admitting: Internal Medicine

## 2011-09-02 ENCOUNTER — Encounter: Payer: Self-pay | Admitting: Internal Medicine

## 2011-09-02 ENCOUNTER — Other Ambulatory Visit: Payer: Self-pay | Admitting: Internal Medicine

## 2011-09-02 VITALS — BP 108/64 | HR 85 | Temp 98.5°F | Resp 14 | Wt 146.4 lb

## 2011-09-02 DIAGNOSIS — E785 Hyperlipidemia, unspecified: Secondary | ICD-10-CM

## 2011-09-02 DIAGNOSIS — I10 Essential (primary) hypertension: Secondary | ICD-10-CM

## 2011-09-02 DIAGNOSIS — E119 Type 2 diabetes mellitus without complications: Secondary | ICD-10-CM

## 2011-09-02 LAB — HEMOGLOBIN A1C: Hgb A1c MFr Bld: 6.4 % (ref 4.6–6.5)

## 2011-09-02 NOTE — Telephone Encounter (Signed)
Done

## 2011-09-02 NOTE — Assessment & Plan Note (Signed)
BMP and LFTs checked elsewhere 8-12 normal. We'll check a hemoglobin A1c.

## 2011-09-02 NOTE — Progress Notes (Signed)
  Subjective:    Patient ID: [name] [name], female    DOB: 05-02-1946, 65 y.o.   MRN: [id number]  HPI Routine office visit DJD, saw rheumatology, she brought records for my review. CBC, BMP, LFTs on 06-2011 essentially normal. White cells were slightly low but came back to normal a month later. High blood pressure, good medication compliance, normal amb BPs. BP today slightly low, patient is asymptomatic. Diabetes, good medication compliance, fasting blood sugars are running 110. Insomnia, good compliance with Ambien, 5 mg is usually enough  Past Medical History  Diagnosis Date  . Diabetes mellitus     w/ neuropathy  . Hyperlipidemia   . Hypertension   . Osteoarthritis     gout, CPPD--- sees Dr.Davenshwar  . Osteopenia     per DEXA 4/10  . Recurrent boils     h/o MRSA  . Chest pain 06/2007    CP and QW on EKG: neg stress test   . Diverticulitis 2000   Past Surgical History  Procedure Date  . Cholecystectomy   . Tonsillectomy     Review of Systems No chest no shortness of breath No lower extremity edema No nausea, vomiting, diarrhea. No myalgias.    Objective:   Physical Exam  Constitutional: She is oriented to person, place, and time. She appears well-developed and well-nourished. No distress.  HENT:  Head: Atraumatic.  Cardiovascular: Normal rate and regular rhythm.   No murmur heard. Pulmonary/Chest: Effort normal and breath sounds normal. No respiratory distress. She has no wheezes. She has no rales.  Musculoskeletal: She exhibits no edema.  Neurological: She is alert and oriented to person, place, and time.  Psychiatric: She has a normal mood and affect. Her behavior is normal. Judgment and thought content normal.          Assessment & Plan:  Visit took place while the computers were down, documentation was done after the patient was gone

## 2011-09-02 NOTE — Assessment & Plan Note (Signed)
Well-controlled, no change recent BMP normal

## 2011-09-02 NOTE — Assessment & Plan Note (Signed)
LFTs a-2012 normal

## 2011-09-09 ENCOUNTER — Other Ambulatory Visit: Payer: Self-pay | Admitting: Internal Medicine

## 2011-09-09 NOTE — Telephone Encounter (Signed)
Done

## 2011-09-14 ENCOUNTER — Telehealth: Payer: Self-pay

## 2011-09-14 NOTE — Telephone Encounter (Signed)
Pt had questions about lab results that were mailed. She was under the impression that she would have some lab work done in reference to her abdominal pain and not just the a1c.   Advised pt to call her GI, Dr. Dorena Cookey, concerning her abdominal pain because of the location of the discomfort. Pt verbalized understanding

## 2011-10-06 ENCOUNTER — Other Ambulatory Visit: Payer: Self-pay | Admitting: Internal Medicine

## 2011-10-06 MED ORDER — METFORMIN HCL 500 MG PO TABS: 500.0000 mg | ORAL_TABLET | Freq: Two times a day (BID) | ORAL | Status: DC

## 2011-10-06 NOTE — Telephone Encounter (Signed)
Rx sent to pharmacy   

## 2011-10-20 ENCOUNTER — Other Ambulatory Visit: Payer: Self-pay | Admitting: Internal Medicine

## 2011-11-01 ENCOUNTER — Other Ambulatory Visit (HOSPITAL_COMMUNITY): Payer: Self-pay | Admitting: Gastroenterology

## 2011-11-01 DIAGNOSIS — R6881 Early satiety: Secondary | ICD-10-CM

## 2011-11-17 ENCOUNTER — Other Ambulatory Visit: Payer: Self-pay | Admitting: Internal Medicine

## 2011-11-18 ENCOUNTER — Other Ambulatory Visit: Payer: Self-pay | Admitting: *Deleted

## 2011-11-18 MED ORDER — ZOLPIDEM TARTRATE 5 MG PO TABS: 5.0000 mg | ORAL_TABLET | Freq: Every evening | ORAL | Status: DC | PRN

## 2011-11-18 NOTE — Telephone Encounter (Signed)
Rx sent to pharmacy, Pt aware.

## 2011-11-18 NOTE — Telephone Encounter (Signed)
Ok 30, 2 RF 

## 2011-11-18 NOTE — Telephone Encounter (Signed)
Last OV 09-02-11, last filled 05-17-11 #30 2. Pt leaving to go out of town in 2 hours.Please advise

## 2011-11-30 ENCOUNTER — Ambulatory Visit: Payer: 59 | Admitting: Internal Medicine

## 2011-12-03 ENCOUNTER — Ambulatory Visit (INDEPENDENT_AMBULATORY_CARE_PROVIDER_SITE_OTHER): Payer: 59 | Admitting: Internal Medicine

## 2011-12-03 ENCOUNTER — Encounter (HOSPITAL_COMMUNITY)
Admission: RE | Admit: 2011-12-03 | Discharge: 2011-12-03 | Disposition: A | Discharge status: Home / Self Care | Payer: 59 | Source: Ambulatory Visit | Attending: Gastroenterology | Admitting: Gastroenterology

## 2011-12-03 ENCOUNTER — Encounter: Payer: Self-pay | Admitting: Internal Medicine

## 2011-12-03 ENCOUNTER — Ambulatory Visit: Payer: 59 | Admitting: Internal Medicine

## 2011-12-03 VITALS — BP 120/88 | HR 86 | Temp 98.3°F | Wt 147.0 lb

## 2011-12-03 DIAGNOSIS — E119 Type 2 diabetes mellitus without complications: Secondary | ICD-10-CM

## 2011-12-03 DIAGNOSIS — I10 Essential (primary) hypertension: Secondary | ICD-10-CM

## 2011-12-03 DIAGNOSIS — R6881 Early satiety: Secondary | ICD-10-CM

## 2011-12-03 DIAGNOSIS — M1990 Unspecified osteoarthritis, unspecified site: Secondary | ICD-10-CM

## 2011-12-03 LAB — BASIC METABOLIC PANEL
Calcium: 9.6 mg/dL (ref 8.4–10.5)
Creatinine, Ser: 0.6 mg/dL (ref 0.4–1.2)
GFR: 134.41 mL/min (ref >60.00)
Glucose, Bld: 98 mg/dL (ref 70–99)
Sodium: 143 mEq/L (ref 135–145)

## 2011-12-03 LAB — CBC WITH DIFFERENTIAL/PLATELET
Basophils Absolute: 0 10*3/uL (ref 0.0–0.1)
Eosinophils Absolute: 0.1 10*3/uL (ref 0.0–0.7)
Hemoglobin: 13.3 g/dL (ref 12.0–15.0)
Lymphocytes Relative: 42.1 % (ref 12.0–46.0)
MCHC: 33.9 g/dL (ref 30.0–36.0)
Monocytes Relative: 6.8 % (ref 3.0–12.0)
Neutro Abs: 2.2 10*3/uL (ref 1.4–7.7)
Neutrophils Relative %: 48 % (ref 43.0–77.0)
RBC: 4.28 Mil/uL (ref 3.87–5.11)
RDW: 13.5 % (ref 11.5–14.6)

## 2011-12-03 LAB — HEMOGLOBIN A1C: Hgb A1c MFr Bld: 6.3 % (ref 4.6–6.5)

## 2011-12-03 MED ORDER — ZOLPIDEM TARTRATE 5 MG PO TABS: ORAL_TABLET | ORAL | Status: DC

## 2011-12-03 NOTE — Assessment & Plan Note (Addendum)
On NSAIDs twice a day, sx resurfaced when she attemped once a day, I still recommend to try sulindac once daily again to see how she does.  rec to talke meds w/ food She does have GI issues but apparently unrelated to NSAIDs  Start prophylactic PPIs

## 2011-12-03 NOTE — Patient Instructions (Signed)
Always take sulindac with meals , watch for stomach pain and change in the color of stools Start prilosec 20 mg 1 a day before breakfast for protection of the stomach

## 2011-12-03 NOTE — Assessment & Plan Note (Signed)
Seems  well-controlled, labs 

## 2011-12-03 NOTE — Progress Notes (Signed)
  Subjective:    Patient ID: [name] [name], female    DOB: [birth year], 66 y.o.   MRN: [id number]  HPI Routine office visit GI issues, she reports early satiety, saw  GI 2 months ago, they prescribed Reglan which is helping. To have a gastric empty study soon. Insomnia, like his prescription written as Ambien 5 mg one or 2 as needed DJD, on sulindac twice a day, previous attempts to decrease to one a day did not work very well. Hypertension, ambulatory blood pressures normal, good medication compliance. Diabetes, good medication compliance. Needs a new ophthalmologist   Past Medical History: Diabetes w/ neuropathy, on neurontin since 07-2010 Hyperlipidemia Hypertension Osteoarthritis, gout, CPPD ------sees Dr Victory Dakin  Osteopenia per DEXA 4-10 recurrent boils, h/o MRSA 06-2007: CP and QW on EKG : neg stress test Diverticulitis, hx of (aprox 2000)  Past Surgical History: Cholecystectomy Tonsillectomy  Review of Systems Denies chest pain, shortness of breath. No low extremity edema Denies any epigastric pain or change in the color of the stools, she does have early satiety as stated above.     Objective:   Physical Exam  Constitutional: She is oriented to person, place, and time. She appears well-developed and well-nourished. No distress.  Cardiovascular: Normal rate, regular rhythm and normal heart sounds.   No murmur heard. Pulmonary/Chest: Effort normal and breath sounds normal. No respiratory distress. She has no wheezes. She has no rales.  Musculoskeletal: She exhibits no edema.  Neurological: She is alert and oriented to person, place, and time.  Skin: Skin is warm and dry. She is not diaphoretic.  Psychiatric: She has a normal mood and affect. Her behavior is normal. Judgment and thought content normal.       Assessment & Plan:

## 2011-12-03 NOTE — Assessment & Plan Note (Signed)
Good med compliance, labs. GI evaluating her for early saciety Also needs a new eye MD----referral provided

## 2011-12-05 ENCOUNTER — Encounter: Payer: Self-pay | Admitting: Internal Medicine

## 2011-12-09 ENCOUNTER — Encounter: Payer: Self-pay | Admitting: *Deleted

## 2011-12-09 ENCOUNTER — Telehealth: Payer: Self-pay | Admitting: *Deleted

## 2011-12-17 ENCOUNTER — Other Ambulatory Visit: Payer: Self-pay | Admitting: Internal Medicine

## 2011-12-17 ENCOUNTER — Encounter (HOSPITAL_COMMUNITY): Payer: Self-pay

## 2011-12-17 ENCOUNTER — Encounter (HOSPITAL_COMMUNITY)
Admission: RE | Admit: 2011-12-17 | Discharge: 2011-12-17 | Disposition: A | Discharge status: Home / Self Care | Payer: 59 | Source: Ambulatory Visit | Attending: Gastroenterology | Admitting: Gastroenterology

## 2011-12-17 DIAGNOSIS — E119 Type 2 diabetes mellitus without complications: Secondary | ICD-10-CM | POA: Insufficient documentation

## 2011-12-17 DIAGNOSIS — R6881 Early satiety: Secondary | ICD-10-CM | POA: Insufficient documentation

## 2011-12-17 DIAGNOSIS — R634 Abnormal weight loss: Secondary | ICD-10-CM | POA: Insufficient documentation

## 2011-12-17 MED ORDER — TECHNETIUM TC 99M SULFUR COLLOID
2.0000 | Freq: Once | INTRAVENOUS | Status: AC | PRN
  Administered 2011-12-17: 2 via ORAL

## 2011-12-17 NOTE — Telephone Encounter (Signed)
Refill request

## 2012-01-22 ENCOUNTER — Other Ambulatory Visit: Payer: Self-pay | Admitting: Internal Medicine

## 2012-01-24 NOTE — Telephone Encounter (Signed)
Prescription sent to pharmacy.

## 2012-02-02 ENCOUNTER — Other Ambulatory Visit: Payer: Self-pay | Admitting: Internal Medicine

## 2012-02-02 MED ORDER — METFORMIN HCL 500 MG PO TABS: 500.0000 mg | ORAL_TABLET | Freq: Two times a day (BID) | ORAL | Status: DC

## 2012-02-02 NOTE — Telephone Encounter (Signed)
Refill done.  

## 2012-02-02 NOTE — Telephone Encounter (Signed)
Refill for  MetFORMIN HCl (Tab) GLUCOPHAGE 500 MG    Take 1 tablet (500 mg total) by mouth 2 (two) times daily with a meal.  Qty 180  Fax is requesting # of refill 99  Last written 11.21.12

## 2012-03-07 NOTE — Telephone Encounter (Signed)
Done

## 2012-03-15 LAB — HM DEXA SCAN

## 2012-03-31 ENCOUNTER — Ambulatory Visit (INDEPENDENT_AMBULATORY_CARE_PROVIDER_SITE_OTHER): Payer: 59 | Admitting: Internal Medicine

## 2012-03-31 ENCOUNTER — Encounter: Payer: Self-pay | Admitting: Internal Medicine

## 2012-03-31 VITALS — BP 138/82 | HR 80 | Temp 98.3°F | Wt 143.0 lb

## 2012-03-31 DIAGNOSIS — E119 Type 2 diabetes mellitus without complications: Secondary | ICD-10-CM

## 2012-03-31 DIAGNOSIS — Z0000 Encounter for general adult medical examination without abnormal findings: Secondary | ICD-10-CM | POA: Insufficient documentation

## 2012-03-31 DIAGNOSIS — Z23 Encounter for immunization: Secondary | ICD-10-CM

## 2012-03-31 DIAGNOSIS — I10 Essential (primary) hypertension: Secondary | ICD-10-CM

## 2012-03-31 DIAGNOSIS — M1990 Unspecified osteoarthritis, unspecified site: Secondary | ICD-10-CM

## 2012-03-31 DIAGNOSIS — M949 Disorder of cartilage, unspecified: Secondary | ICD-10-CM

## 2012-03-31 DIAGNOSIS — R1310 Dysphagia, unspecified: Secondary | ICD-10-CM

## 2012-03-31 DIAGNOSIS — M899 Disorder of bone, unspecified: Secondary | ICD-10-CM

## 2012-03-31 DIAGNOSIS — E785 Hyperlipidemia, unspecified: Secondary | ICD-10-CM

## 2012-03-31 LAB — LIPID PANEL
Cholesterol: 196 mg/dL (ref 0–200)
Triglycerides: 62 mg/dL (ref 0.0–149.0)

## 2012-03-31 LAB — AST: AST: 14 U/L (ref 0–37)

## 2012-03-31 LAB — BASIC METABOLIC PANEL
BUN: 14 mg/dL (ref 6–23)
Calcium: 9.5 mg/dL (ref 8.4–10.5)
Chloride: 106 mEq/L (ref 96–112)
Creatinine, Ser: 0.6 mg/dL (ref 0.4–1.2)
GFR: 126.68 mL/min (ref >60.00)

## 2012-03-31 LAB — ALT: ALT: 14 U/L (ref 0–35)

## 2012-03-31 MED ORDER — ZOSTER VACCINE LIVE 19400 UNT/0.65ML ~~LOC~~ SOLR: 0.6500 mL | Freq: Once | SUBCUTANEOUS | Status: DC

## 2012-03-31 NOTE — Progress Notes (Signed)
  Subjective:    Patient ID: [name] [name], female    DOB: November 08, 1946, 66 y.o.   MRN: [id number]  HPI Here for Medicare AWV: 1. Risk factors based on Past M, S, F history: reviewed 2. Physical Activities:  occ take walks, does some yard work weekly  3. Depression/mood:  No depression or anxiety, no tinnitus  4. Hearing:  No problemss noted or reported  5. ADL's:  Independent , drives  6. Fall Risk: no recent falls , prevention discussed 7. home Safety: does feelsafe at home  8. Height, weight, &visual acuity: see VS, sees eye doctor regularly  9. Counseling: provided 10. Labs ordered based on risk factors: if needed  11. Referral Coordination: if needed 12.  Care Plan, see assessment and plan  13.   Cognitive Assessment: Motor skills and cognition within normal for age  In addition, today we discussed the following: Osteoarthritis, taking Sulindac as needed only, she has chronic GI symptoms, they seem to be better than before. High cholesterol, today she states that she doesn't think she's taking Pravachol and has not been taking it for a while. Diabetes, good medication compliance, ambulatory blood sugars described as normal. Hypertension, good medication compliance, no ambulatory blood pressures. Insomnia, takes Ambien as needed, works well for her.   Past Medical History:  Diabetes w/ neuropathy, on neurontin since 07-2010  Hyperlipidemia  Hypertension  Osteoarthritis, gout, CPPD ------sees Dr Victory Dakin  Osteopenia per DEXA 4-10  recurrent boils, h/o MRSA  06-2007: CP and QW on EKG : neg stress test  Diverticulitis, hx of (aprox 2000)  Asthma as a child   Past Surgical History:  Cholecystectomy  Tonsillectomy  Family History: HTN - daughter, M, F, sister DM - M, F, sister CAD - no stroke - no colon Ca - no breast Ca - no M deceased complications DM F deceased  Social History: got married (1st time ) 2010, children x 3 , 7 GK  Never Smoked Alcohol  use-no Drug use-no Retired 2009 , 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 No lower extremity edema. No nausea, vomiting. She does have occasionally early satiety. She had diarrhea last week, symptoms resolved, no blood in the stools. Overall, her GI symptoms are better She has a history of asthma as a child, for the last month, she occasionally here for wheezing. Denies any cough.    Objective:   Physical Exam General -- alert, well-developed, and well-nourished.   Neck --no thyromegaly , normal carotid pulse Lungs -- normal respiratory effort, no intercostal retractions, no accessory muscle use, and normal breath sounds.   Heart-- normal rate, regular rhythm, no murmur, and no gallop.   Abdomen--soft, non-tender, no distention, no masses, no HSM, no guarding, and no rigidity.   Extremities-- no pretibial edema bilaterally Neurologic-- alert & oriented X3 and strength normal in all extremities. Psych-- Cognition and judgment appear intact. Alert and cooperative with normal attention span and concentration.  not anxious appearing and not depressed appearing.       Assessment & Plan:

## 2012-03-31 NOTE — Assessment & Plan Note (Signed)
EGD done , DR Madilyn Fireman, 09-2010 , dx w/ stricture Last OV 09-2011, rec to revisit GI if sx increase  On PPIs prn

## 2012-03-31 NOTE — Assessment & Plan Note (Signed)
Td 2011 pneumonia shot 2008 ---gave one today shingles immunization discussed before , Rx provided  Cscope 2006, and  November 2011, next 2016 has not seen gyn in years--- states will call DR Gaynell Face  mammogram  neg  7-12  Diet-exercise discussed

## 2012-03-31 NOTE — Assessment & Plan Note (Signed)
Recommend dexa Daily ca and vit D encouraged

## 2012-03-31 NOTE — Assessment & Plan Note (Signed)
On NSAIDs prn only, seems to tolerate well

## 2012-03-31 NOTE — Assessment & Plan Note (Addendum)
Well controlled , EKG nsr, labs

## 2012-03-31 NOTE — Assessment & Plan Note (Signed)
Today she reports has not been taking pravachol Labs

## 2012-03-31 NOTE — Assessment & Plan Note (Signed)
Due for labs

## 2012-04-03 ENCOUNTER — Encounter: Payer: Self-pay | Admitting: *Deleted

## 2012-05-16 ENCOUNTER — Other Ambulatory Visit: Payer: Self-pay | Admitting: Internal Medicine

## 2012-05-16 DIAGNOSIS — Z1231 Encounter for screening mammogram for malignant neoplasm of breast: Secondary | ICD-10-CM

## 2012-05-31 ENCOUNTER — Other Ambulatory Visit: Payer: Self-pay | Admitting: Internal Medicine

## 2012-05-31 NOTE — Telephone Encounter (Signed)
Refill done.  

## 2012-06-06 ENCOUNTER — Ambulatory Visit
Admission: RE | Admit: 2012-06-06 | Discharge: 2012-06-06 | Disposition: A | Discharge status: Home / Self Care | Payer: 59 | Source: Ambulatory Visit | Attending: Internal Medicine | Admitting: Internal Medicine

## 2012-06-06 ENCOUNTER — Telehealth: Payer: Self-pay | Admitting: Internal Medicine

## 2012-06-06 DIAGNOSIS — Z1231 Encounter for screening mammogram for malignant neoplasm of breast: Secondary | ICD-10-CM

## 2012-06-06 MED ORDER — METFORMIN HCL 500 MG PO TABS: 500.0000 mg | ORAL_TABLET | Freq: Two times a day (BID) | ORAL | Status: DC

## 2012-06-06 NOTE — Telephone Encounter (Signed)
Refill done.  

## 2012-06-06 NOTE — Telephone Encounter (Signed)
Refill: Metformin 500mg  tablet. Take one tablet by mouth two times daily with a meal. Qty 180.

## 2012-06-07 ENCOUNTER — Other Ambulatory Visit: Payer: Self-pay | Admitting: Internal Medicine

## 2012-06-07 ENCOUNTER — Other Ambulatory Visit: Payer: Self-pay | Admitting: *Deleted

## 2012-06-07 MED ORDER — ZOLPIDEM TARTRATE 5 MG PO TABS: ORAL_TABLET | ORAL | Status: DC

## 2012-06-07 NOTE — Telephone Encounter (Signed)
Rx sent. Left message to call office to advise Pt.

## 2012-06-07 NOTE — Telephone Encounter (Signed)
ZOLPIDEM TARTRATE 5 MG TABLET LAST REFILL:05/06/12 TAKE 1 TO 2 TABLETS AT BEDTIMES AS NEEDED.

## 2012-06-07 NOTE — Telephone Encounter (Signed)
Last filled 12-03-11 #60 5, last OV 03-31-12

## 2012-06-07 NOTE — Telephone Encounter (Signed)
Ok 60, 5 Rf 

## 2012-06-29 ENCOUNTER — Other Ambulatory Visit: Payer: Self-pay | Admitting: Internal Medicine

## 2012-08-01 ENCOUNTER — Encounter: Payer: Self-pay | Admitting: Internal Medicine

## 2012-08-01 ENCOUNTER — Ambulatory Visit (INDEPENDENT_AMBULATORY_CARE_PROVIDER_SITE_OTHER): Payer: Medicare Other | Admitting: Internal Medicine

## 2012-08-01 ENCOUNTER — Ambulatory Visit (INDEPENDENT_AMBULATORY_CARE_PROVIDER_SITE_OTHER)
Admission: RE | Admit: 2012-08-01 | Discharge: 2012-08-01 | Disposition: A | Discharge status: Home / Self Care | Payer: Medicare Other | Source: Ambulatory Visit | Attending: Internal Medicine | Admitting: Internal Medicine

## 2012-08-01 VITALS — BP 118/72 | HR 83 | Temp 97.8°F | Wt 139.0 lb

## 2012-08-01 DIAGNOSIS — M1990 Unspecified osteoarthritis, unspecified site: Secondary | ICD-10-CM

## 2012-08-01 DIAGNOSIS — H6120 Impacted cerumen, unspecified ear: Secondary | ICD-10-CM

## 2012-08-01 DIAGNOSIS — Z23 Encounter for immunization: Secondary | ICD-10-CM

## 2012-08-01 DIAGNOSIS — E119 Type 2 diabetes mellitus without complications: Secondary | ICD-10-CM

## 2012-08-01 DIAGNOSIS — R1310 Dysphagia, unspecified: Secondary | ICD-10-CM

## 2012-08-01 DIAGNOSIS — M949 Disorder of cartilage, unspecified: Secondary | ICD-10-CM

## 2012-08-01 DIAGNOSIS — E785 Hyperlipidemia, unspecified: Secondary | ICD-10-CM

## 2012-08-01 NOTE — Assessment & Plan Note (Signed)
Last FLP very good, on no medicines. Recheck FLPs yearly

## 2012-08-01 NOTE — Assessment & Plan Note (Signed)
Well-controlled per last A1c, no change, return to the office in 4 months

## 2012-08-01 NOTE — Assessment & Plan Note (Signed)
She is now off sulindac for my advise and per rheumatology advice. Continue with on and off pain, encouraged use of Tylenol as needed

## 2012-08-01 NOTE — Patient Instructions (Signed)
Tylenol  500 mg OTC 2 tabs a day every 8 hours as needed for pain   

## 2012-08-01 NOTE — Assessment & Plan Note (Signed)
Saw Dr. Madilyn Fireman recently, they discontinue PPIs, on Reglan,slightly better.

## 2012-08-01 NOTE — Progress Notes (Signed)
  Subjective:    Patient ID: [name] [name], female    DOB: 1946/05/07, 66 y.o.   MRN: [id number]  HPI Routine office visit Complained of discomfort at the left ear, denies any ear discharge, decreased hearing. DJD, she is now off sulindac, using topical NSAIDs. Pain is notr as well controlled as before, saw her rheumatologist, had a left shoulder injection. GI symptoms, saw Dr. Madilyn Fireman, PPIs were discontinue, on Reglan, slightly better. As far as her diabetes and high blood pressure, good medication compliance, reports normal ambulatory BPs and good ambulatory CBGs    Past Medical History:   Diabetes w/ neuropathy, on neurontin since 07-2010   Hyperlipidemia   Hypertension   Osteoarthritis, gout, CPPD ------sees Dr Victory Dakin  GI-- dysphagia,early saciety  Osteopenia per DEXA 4-10   recurrent boils, h/o MRSA   06-2007: CP and QW on EKG : neg stress test   Diverticulitis, hx of (aprox 2000)   Asthma as a child   Past Surgical History:   Cholecystectomy   Tonsillectomy  Family History: HTN - daughter, M, F, sister DM - M, F, sister CAD - no stroke - no colon Ca - no breast Ca - no M deceased complications DM F deceased  Social History: got married (1st time ) 2010, children x 3 , 7 GK   Never Smoked Alcohol use-no Drug use-no Retired 2009 , 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

## 2012-08-01 NOTE — Assessment & Plan Note (Signed)
Abundant wax removed with forceps from the --L--  ear. Tympanic membrane normal after the procedure.

## 2012-08-20 ENCOUNTER — Telehealth: Payer: Self-pay | Admitting: Internal Medicine

## 2012-08-20 NOTE — Telephone Encounter (Signed)
(  T score in the past was -1.1, current T score -1.3) Advise patient, bone density test showed osteopenia, it is slightly worse than before, in addition to calcium and  vitamin D supplements daily and weightbearing exercises, I recommend to recheck a bone density test in 2 to 3 years

## 2012-08-21 NOTE — Telephone Encounter (Signed)
lmovm for pt to return call.  

## 2012-08-21 NOTE — Telephone Encounter (Signed)
Spoke with pt. He is going to bring by another form.

## 2012-08-22 ENCOUNTER — Encounter: Payer: Self-pay | Admitting: *Deleted

## 2012-08-22 ENCOUNTER — Telehealth: Payer: Self-pay

## 2012-08-22 NOTE — Telephone Encounter (Signed)
Mailed results  

## 2012-08-22 NOTE — Telephone Encounter (Signed)
Pt called in for BMD results I advised pt:  (T score in the past was -1.1, current T score -1.3)  Advise patient, bone density test showed osteopenia, it is slightly worse than before, in addition to calcium and vitamin D supplements daily and weightbearing exercises, I recommend to recheck a bone density test in 2 to 3 years  Pt stated understanding. Plz mail out copy of lab results per pt request.          MW

## 2012-10-05 ENCOUNTER — Telehealth: Payer: Self-pay | Admitting: Internal Medicine

## 2012-10-05 NOTE — Telephone Encounter (Signed)
Refill- metformin 500mg  tablet. Take one tablet(500mg  total) by mouth two times daily with a meal. Qty 180

## 2012-10-06 MED ORDER — METFORMIN HCL 500 MG PO TABS: 500.0000 mg | ORAL_TABLET | Freq: Two times a day (BID) | ORAL | Status: DC

## 2012-10-06 NOTE — Telephone Encounter (Signed)
Refill done.  

## 2012-10-30 ENCOUNTER — Telehealth: Payer: Self-pay | Admitting: Internal Medicine

## 2012-10-30 NOTE — Telephone Encounter (Signed)
Please call pt,see how she's doing: latests BP and heart rate readings ? What medications for  BP is she taking?

## 2012-10-30 NOTE — Telephone Encounter (Signed)
[facility] Triage Call Report Triage Record Num: [id number] Operator: Levora Angel Patient Name: [name] [name] Call Date & Time: 10/27/2012 5:10:14PM Patient Phone: [phone] PCP: Nolon Rod. Paz Patient Gender: Female PCP Fax : Patient DOB: [birth year] Practice Name: [facility] Reason for Call: Caller: [name]/Patient; PCP: Willow Ora; CB#: [phone]; Call regarding Low Blood Pressure; Patient states that she was recently placed on Colchicine for pain and noticed an increase in her Heart rate. Patient was later informed that the medication does not work well with her blood pressure medication. Patient has not taken the medication since last week however states that heart rate is still running high compared to her baseline of 74. Heart rate is currently 91. States that he blood pressure has also been running low over the last few days with blood pressure 88/71 last night. Current blood pressure is 113/78. Patient does not have any other symptoms at this time. Patient states that she is currently asymptomatic at this time and is out shopping today and just wanted to make the doctor aware of her symptoms. Patient states that she has an appointment in January for evaluation. Protocol(s) Used: Office Note Recommended Outcome per Protocol: Information Noted and Sent to Office Reason for Outcome: Caller information to office

## 2012-10-30 NOTE — Telephone Encounter (Signed)
lmovm for pt to return call.  

## 2012-11-02 ENCOUNTER — Telehealth: Payer: Self-pay | Admitting: Internal Medicine

## 2012-11-02 MED ORDER — METFORMIN HCL 500 MG PO TABS: 500.0000 mg | ORAL_TABLET | Freq: Two times a day (BID) | ORAL | Status: DC

## 2012-11-02 NOTE — Telephone Encounter (Signed)
Pt states that her BP has been running 100/70. She states that she has been taking her losartan 100mg  & diltiazem 300mg . Pt scheduled appt for 12.30.13 to  see  Dr. Drue Novel

## 2012-11-02 NOTE — Telephone Encounter (Signed)
Pt returned your call.  

## 2012-11-02 NOTE — Telephone Encounter (Signed)
Refill- metformin 500mg  tablet. Take one tablet by mouth twice a day with meals. Qty 180

## 2012-11-02 NOTE — Telephone Encounter (Signed)
lmovm for pt to return call.  

## 2012-11-02 NOTE — Telephone Encounter (Signed)
Refill done.  

## 2012-11-03 NOTE — Telephone Encounter (Signed)
Discussed with pt

## 2012-11-03 NOTE — Telephone Encounter (Signed)
Advise patient, BP is running low. Continue diltiazem 300 mg Decrease losartan 100 mg from one tablet daily to 1/2 tablet daily Return to the office as scheduled, call if BP is going over150/85.

## 2012-11-10 ENCOUNTER — Encounter: Payer: Self-pay | Admitting: Lab

## 2012-11-13 ENCOUNTER — Ambulatory Visit (INDEPENDENT_AMBULATORY_CARE_PROVIDER_SITE_OTHER): Payer: Medicare Other | Admitting: Internal Medicine

## 2012-11-13 ENCOUNTER — Encounter: Payer: Self-pay | Admitting: Internal Medicine

## 2012-11-13 VITALS — BP 126/72 | HR 85 | Temp 98.0°F | Wt 138.0 lb

## 2012-11-13 DIAGNOSIS — I10 Essential (primary) hypertension: Secondary | ICD-10-CM

## 2012-11-13 DIAGNOSIS — M1120 Other chondrocalcinosis, unspecified site: Secondary | ICD-10-CM

## 2012-11-13 NOTE — Assessment & Plan Note (Addendum)
Transit   decreased on her BP, now better by decreasing losartan from 100 mg to 50 mg. Unclear if low BP was related to colchicine as patient believes. Plan: No change, monitor BP, see instructions.

## 2012-11-13 NOTE — Patient Instructions (Addendum)
Check the  blood pressure 2 or 3 times a week, be sure it is between 110/60 and 135/85. If it is consistently higher or lower, let me know. For now continue with half losartan

## 2012-11-13 NOTE — Progress Notes (Signed)
  Subjective:    Patient ID: [name] [name], female    DOB: May 30, 1946, 66 y.o.   MRN: [id number]  HPI Here for BP evaluation. Several days ago, she call us because her BP was as low as 88/71. Recommended to decrease losartan 100 mg to half tablet daily which she did. Since then, BP is slightly better, BP today 126/72.  Past Medical History:   Diabetes w/ neuropathy, on neurontin since 07-2010   Hyperlipidemia   Hypertension   Osteoarthritis, gout, CPPD ------sees Dr Victory Dakin   GI-- dysphagia,early saciety  Osteopenia per DEXA 4-10   recurrent boils, h/o MRSA   06-2007: CP and QW on EKG : neg stress test   Diverticulitis, hx of (aprox 2000)   Asthma as a child   Past Surgical History:   Cholecystectomy   Tonsillectomy  Family History: HTN - daughter, M, F, sister DM - M, F, sister CAD - no stroke - no colon Ca - no breast Ca - no M deceased complications DM F deceased  Social History: got married (1st time ) 2010, children x 3 , 7 GK   Never Smoked Alcohol use-no Drug use-no Retired 2009 , 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 Also, reports that she went to see rheumatology in November 2013 with shoulder pain, got a shot at the right shoulder, pain is now better. At that time she was prescribed colcrys  daily for several pseudogout. She states that she stopped taking colchicine b/c feels that it increase her BP and also cause some palpitations.    Objective:   Physical Exam General -- alert, well-developed Lungs -- normal respiratory effort, no intercostal retractions, no accessory muscle use, and normal breath sounds.   Heart-- normal rate, regular rhythm, no murmur, and no gallop.   Extremities-- no pretibial edema bilaterally Psych-- Cognition and judgment appear intact. Alert and cooperative with normal attention span and concentration.  not  anxious appearing and not depressed appearing.      Assessment & Plan:

## 2012-11-13 NOTE — Assessment & Plan Note (Addendum)
Followup by rheumatology,note from 09-2012 reviewed: prescribed colcrys and got a shot at the  right shoulder for bursitis. Patient self discontinued colcrys, s/e? See H&P Recommend to follow up with rheumatology

## 2012-12-04 ENCOUNTER — Encounter: Payer: Self-pay | Admitting: Lab

## 2012-12-05 ENCOUNTER — Ambulatory Visit (INDEPENDENT_AMBULATORY_CARE_PROVIDER_SITE_OTHER): Payer: Medicare Other | Admitting: Internal Medicine

## 2012-12-05 VITALS — BP 136/78 | HR 91 | Temp 98.0°F | Wt 137.0 lb

## 2012-12-05 DIAGNOSIS — I10 Essential (primary) hypertension: Secondary | ICD-10-CM

## 2012-12-05 DIAGNOSIS — M1990 Unspecified osteoarthritis, unspecified site: Secondary | ICD-10-CM

## 2012-12-05 DIAGNOSIS — E119 Type 2 diabetes mellitus without complications: Secondary | ICD-10-CM

## 2012-12-05 LAB — HEMOGLOBIN A1C: Hgb A1c MFr Bld: 7 % — ABNORMAL HIGH (ref 4.6–6.5)

## 2012-12-05 MED ORDER — PREDNISOLONE ACETATE 1 % OP SUSP: OPHTHALMIC | Status: DC

## 2012-12-05 NOTE — Progress Notes (Signed)
  Subjective:    Patient ID: [name] [name], female    DOB: 1946-06-05, 67 y.o.   MRN: [id number]  HPI Followup from previous visit Hypertension, she thought she had to stop taking losartan so she has not been taking it for weeks. Ambulatory BP remains less than 130/80. Diabetes, good medication compliance, ambulatory blood sugars always less than 126. Today reports pain in the left shoulder, worse with arm motion (recommend to discuss with rheumatology, may need a orthopedic referral) Had blood work drawn in few days ago at rheumatology. Will get records.    Past Medical History  Diagnosis Date  . Diabetes mellitus     w/ neuropathy  . Hyperlipidemia   . Hypertension   . Osteoarthritis     gout, CPPD--- sees Dr.Davenshwar  . Osteopenia     per DEXA 4/10  . Recurrent boils     h/o MRSA  . Chest pain 06/2007    CP and QW on EKG: neg stress test   . Diverticulitis 2000   Past Surgical History  Procedure Date  . Cholecystectomy   . 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   Heart-- normal rate, regular rhythm, no murmur, and no gallop.   Extremities-- no pretibial edema bilaterally  Psych-- Cognition and judgment appear intact. Alert and cooperative with normal attention span and concentration.  not anxious appearing and not depressed appearing.        Assessment & Plan:

## 2012-12-05 NOTE — Assessment & Plan Note (Signed)
Continue with shoulder pain, recommend to see rheumatology

## 2012-12-05 NOTE — Assessment & Plan Note (Addendum)
The patient stopped taking losartan, despite that her ambulatory BPs are normal. Plan: Continue with Cardizem Had labs low at rheumatology few days ago, will try to get them. Addendum: Labs from 11/29/2012: Creatinine 0.7, LFTs normal, CBC normal.

## 2012-12-05 NOTE — Patient Instructions (Addendum)
Use the drops 3 times a day for one week. Please get the labs from rheumatology.  Next visit in 4 months, fasting for a complete checkup. Check the  blood pressure 2 or 3 times a week, be sure it is between 110/60 and 140/85. If it is consistently higher or lower, let me know

## 2012-12-05 NOTE — Assessment & Plan Note (Addendum)
Off ACEand ARBs, good compliance with metformin. Plan: Labs, check a hemoglobin A1c Addendum: Labs @ rheumatology from 11/29/2012: Creatinine 0.7, LFTs normal, CBC normal.

## 2012-12-07 ENCOUNTER — Encounter: Payer: Self-pay | Admitting: Internal Medicine

## 2012-12-08 ENCOUNTER — Telehealth: Payer: Self-pay | Admitting: *Deleted

## 2012-12-08 MED ORDER — METFORMIN HCL 1000 MG PO TABS: 1000.0000 mg | ORAL_TABLET | Freq: Two times a day (BID) | ORAL | Status: DC

## 2012-12-08 NOTE — Addendum Note (Signed)
Addended by: Edwena Felty T on: 12/08/2012 04:02 PM   Modules accepted: Orders

## 2012-12-09 ENCOUNTER — Other Ambulatory Visit: Payer: Self-pay | Admitting: Internal Medicine

## 2012-12-11 NOTE — Telephone Encounter (Signed)
Pt made aware rx has already been sent to pharmacy.

## 2012-12-11 NOTE — Telephone Encounter (Signed)
Ok to refill? Last OV 1.21.14 Last filled 7.24.13

## 2012-12-11 NOTE — Telephone Encounter (Signed)
Refill done, advised patient it changed  the sig to 1 po qhs (not 1 or 2 qhs)

## 2012-12-11 NOTE — Telephone Encounter (Signed)
Done

## 2012-12-11 NOTE — Telephone Encounter (Signed)
Please call to advise if Ambien Rx is ready.

## 2012-12-18 ENCOUNTER — Other Ambulatory Visit: Payer: Self-pay | Admitting: Internal Medicine

## 2012-12-18 NOTE — Telephone Encounter (Signed)
Refill done.  

## 2012-12-26 ENCOUNTER — Telehealth: Payer: Self-pay | Admitting: *Deleted

## 2012-12-26 MED ORDER — ZOLPIDEM TARTRATE 10 MG PO TABS: 10.0000 mg | ORAL_TABLET | Freq: Every evening | ORAL | Status: DC | PRN

## 2012-12-26 NOTE — Telephone Encounter (Signed)
Pt called stating that taking only 1 tablet at bedtime is not helping her sleep & taking 2 tablets at bedtime is helping her sleep better. Pt stated that she went back to taking 2 tablets at bedtime & now the pharmacy is not allowing her to refill her prescription. Pt is asking that we call the pharmacy & change the pt's SIG. Please advise.

## 2012-12-26 NOTE — Telephone Encounter (Signed)
Refill done. Pt made aware.  

## 2012-12-26 NOTE — Telephone Encounter (Signed)
I assumed the phone call is about Ambien  Okay to change Ambien from 5 to 10 mg one by mouth each bedtime #30 and 3 refills

## 2012-12-30 ENCOUNTER — Other Ambulatory Visit: Payer: Self-pay | Admitting: Internal Medicine

## 2013-01-01 NOTE — Telephone Encounter (Signed)
Refill done.  

## 2013-02-13 LAB — HM DIABETES EYE EXAM

## 2013-02-20 ENCOUNTER — Ambulatory Visit (INDEPENDENT_AMBULATORY_CARE_PROVIDER_SITE_OTHER): Payer: Medicare Other | Admitting: Internal Medicine

## 2013-02-20 ENCOUNTER — Encounter: Payer: Self-pay | Admitting: Internal Medicine

## 2013-02-20 VITALS — BP 124/80 | HR 92 | Temp 97.6°F | Ht 62.5 in | Wt 136.0 lb

## 2013-02-20 DIAGNOSIS — E119 Type 2 diabetes mellitus without complications: Secondary | ICD-10-CM

## 2013-02-20 DIAGNOSIS — I10 Essential (primary) hypertension: Secondary | ICD-10-CM

## 2013-02-20 DIAGNOSIS — G4700 Insomnia, unspecified: Secondary | ICD-10-CM

## 2013-02-20 DIAGNOSIS — K3184 Gastroparesis: Secondary | ICD-10-CM | POA: Insufficient documentation

## 2013-02-20 DIAGNOSIS — E785 Hyperlipidemia, unspecified: Secondary | ICD-10-CM

## 2013-02-20 HISTORY — DX: Insomnia, unspecified: G47.00

## 2013-02-20 LAB — LIPID PANEL
Cholesterol: 224 mg/dL — ABNORMAL HIGH (ref 0–200)
HDL: 84 mg/dL (ref >39.00)
Total CHOL/HDL Ratio: 3
Triglycerides: 92 mg/dL (ref 0.0–149.0)
VLDL: 18.4 mg/dL (ref 0.0–40.0)

## 2013-02-20 LAB — BASIC METABOLIC PANEL
CO2: 25 mEq/L (ref 19–32)
Calcium: 9.4 mg/dL (ref 8.4–10.5)
Chloride: 104 mEq/L (ref 96–112)
Glucose, Bld: 119 mg/dL — ABNORMAL HIGH (ref 70–99)
Potassium: 3.8 mEq/L (ref 3.5–5.1)
Sodium: 140 mEq/L (ref 135–145)

## 2013-02-20 LAB — MICROALBUMIN / CREATININE URINE RATIO: Creatinine,U: 184.6 mg/dL

## 2013-02-20 LAB — AST: AST: 12 U/L (ref 0–37)

## 2013-02-20 MED ORDER — METOCLOPRAMIDE HCL 5 MG PO TABS: 5.0000 mg | ORAL_TABLET | Freq: Three times a day (TID) | ORAL | Status: DC

## 2013-02-20 NOTE — Assessment & Plan Note (Signed)
BP continued to be well-controlled, check a BMP

## 2013-02-20 NOTE — Assessment & Plan Note (Signed)
Insomnia and some snoring, see HPI, cont ambien for now

## 2013-02-20 NOTE — Assessment & Plan Note (Signed)
Due for labs

## 2013-02-20 NOTE — Progress Notes (Signed)
  Subjective:    Patient ID: [name] [name], female    DOB: Sep 19, 1946, 67 y.o.   MRN: [id number]  HPI  ROV --Diabetes, increased metformin to 1000 mg twice a day, since then GI symptoms have increased. --Gastroparesis, increased symptoms lately, described as a mid abdominal discomfort after eating, usually better after a bowel movement.  Denies heartburn per se . Occasional diarrhea. No blood in the stools. Was prescribed Reglan before, has not taken it lately but in the past it did help. --Insomnia, taking Ambien 10 mg some nights, able to sleep to 4-5 hours but would like to sleep more. Admits to snoring, never been tested for sleep apnea, snoring decreased using "nasal strips". Denies feeling somnolent during the day. --Hypertension, good medication compliance.  Past Medical History  Diagnosis Date  . Diabetes mellitus     w/ neuropathy  . Hyperlipidemia   . Hypertension   . Osteoarthritis     gout, CPPD--- sees Dr.Davenshwar  . Osteopenia     per DEXA 4/10  . Recurrent boils     h/o MRSA  . Chest pain 06/2007    CP and QW on EKG: neg stress test   . Diverticulitis 2000  . Insomnia 02/20/2013   Past Surgical History  Procedure Laterality Date  . Cholecystectomy    . Tonsillectomy     Review of Systems  see HPI, denies CP-cough      Objective:   Physical Exam General -- alert, well-developed, NAD.    Lungs -- normal respiratory effort, no intercostal retractions, no accessory muscle use, and normal breath sounds.   Heart-- normal rate, regular rhythm, no murmur, and no gallop.   Abdomen--soft, non-tender, no distention, no masses, no HSM, no guarding, and no rigidity.   Extremities-- no pretibial edema bilaterally Neurologic-- alert & oriented X3 and strength normal in all extremities. Psych-- Cognition and judgment appear intact. Alert and cooperative with normal attention span and concentration.  not anxious appearing and not depressed appearing.       Assessment  & Plan:

## 2013-02-20 NOTE — Assessment & Plan Note (Addendum)
Last hemoglobin A1c 7.0,has been taking metformin 1000 mg twice a day as having GI side effects. Plan: Labs Decrease metformin 1000 mg twice a day to 500 mg 1.5 tablets twice a day. Treat GI symptoms with Reglan Reports recent negative eye exam. Check microalb, she's not on ACE-ARBs

## 2013-02-20 NOTE — Patient Instructions (Addendum)
Take Reglan 30 minutes before each meal Taking metformin 500 mg 1.5 tablets twice a day Come back in 3 months

## 2013-02-20 NOTE — Assessment & Plan Note (Addendum)
Chart reviewed, history of gastroparesis documented by gastric emptying study 10/2011. Has seen GI at multiple times, they have recommended Reglan. GI symptoms recently exacerbated likely a combination of gastroparesis and a higher dose of metformin Plan: Take Reglan regularly. Refill provided.

## 2013-03-05 ENCOUNTER — Telehealth: Payer: Self-pay | Admitting: *Deleted

## 2013-03-05 ENCOUNTER — Telehealth: Payer: Self-pay | Admitting: Internal Medicine

## 2013-03-05 MED ORDER — PRAVASTATIN SODIUM 20 MG PO TABS: 20.0000 mg | ORAL_TABLET | Freq: Every day | ORAL | Status: DC

## 2013-03-05 MED ORDER — METFORMIN HCL 500 MG PO TABS: 500.0000 mg | ORAL_TABLET | Freq: Two times a day (BID) | ORAL | Status: DC

## 2013-03-05 NOTE — Telephone Encounter (Signed)
Refill- metformin 500mg  tablet. Take one tablet by mouth two times daily with meals. Qty 180

## 2013-03-05 NOTE — Telephone Encounter (Signed)
Message copied by Verdene Rio on Mon Mar 05, 2013  9:38 AM ------      Message from: Pecola Lawless      Created: Thu Mar 01, 2013  4:27 PM       With Diabetes LDL or BAD cholesterol goal = < 100, ideally < 70. Please call in new  Rx for Pravastatin 20 mg qhs #30 , RX2.      Please  schedule fasting Labs after in10 weeks:CK,Lipids, hepatic panel. Codes: 272.4,995.20  ------

## 2013-03-05 NOTE — Telephone Encounter (Signed)
Refill done.  

## 2013-03-05 NOTE — Telephone Encounter (Signed)
Rx sent, Discuss with patient  

## 2013-03-12 ENCOUNTER — Other Ambulatory Visit: Payer: Self-pay | Admitting: *Deleted

## 2013-03-12 MED ORDER — METFORMIN HCL 500 MG PO TABS: 500.0000 mg | ORAL_TABLET | Freq: Two times a day (BID) | ORAL | Status: DC

## 2013-03-12 NOTE — Telephone Encounter (Signed)
Rx sent 

## 2013-03-13 ENCOUNTER — Encounter: Payer: Self-pay | Admitting: *Deleted

## 2013-04-20 ENCOUNTER — Telehealth: Payer: Self-pay | Admitting: Internal Medicine

## 2013-04-20 NOTE — Telephone Encounter (Signed)
done

## 2013-04-20 NOTE — Telephone Encounter (Signed)
Ok to refill? Last OV 4.8.14 Last filled 2.11.14

## 2013-06-08 ENCOUNTER — Encounter: Payer: Self-pay | Admitting: Internal Medicine

## 2013-06-25 ENCOUNTER — Other Ambulatory Visit: Payer: Self-pay

## 2013-06-25 DIAGNOSIS — Z1231 Encounter for screening mammogram for malignant neoplasm of breast: Secondary | ICD-10-CM

## 2013-07-05 ENCOUNTER — Other Ambulatory Visit: Payer: Self-pay | Admitting: *Deleted

## 2013-07-05 MED ORDER — METFORMIN HCL 500 MG PO TABS: 500.0000 mg | ORAL_TABLET | Freq: Two times a day (BID) | ORAL | Status: DC

## 2013-07-05 NOTE — Telephone Encounter (Signed)
Rx has been filled for Metformin 500 mg.  Ag cma

## 2013-07-06 ENCOUNTER — Other Ambulatory Visit: Payer: Self-pay | Admitting: Internal Medicine

## 2013-07-12 ENCOUNTER — Other Ambulatory Visit: Payer: Self-pay | Admitting: General Practice

## 2013-07-12 MED ORDER — PRAVASTATIN SODIUM 20 MG PO TABS: 20.0000 mg | ORAL_TABLET | Freq: Every day | ORAL | Status: DC

## 2013-07-17 ENCOUNTER — Ambulatory Visit
Admission: RE | Admit: 2013-07-17 | Discharge: 2013-07-17 | Disposition: A | Discharge status: Home / Self Care | Payer: Medicare Other | Source: Ambulatory Visit

## 2013-07-17 DIAGNOSIS — Z1231 Encounter for screening mammogram for malignant neoplasm of breast: Secondary | ICD-10-CM

## 2013-08-06 ENCOUNTER — Other Ambulatory Visit: Payer: Self-pay | Admitting: Internal Medicine

## 2013-08-07 ENCOUNTER — Other Ambulatory Visit: Payer: Self-pay | Admitting: *Deleted

## 2013-08-07 NOTE — Telephone Encounter (Signed)
rx refilled per protocol. DJR  

## 2013-08-21 ENCOUNTER — Other Ambulatory Visit: Payer: Self-pay | Admitting: Internal Medicine

## 2013-08-21 NOTE — Telephone Encounter (Signed)
rx refill- Ambien 10 mg  Last ov- 02/20/13 Future OV- 09/12/13  Last refilled -04/20/13 #30 / 3 rf No UDS on file .  Please advise . DJR

## 2013-08-22 ENCOUNTER — Telehealth: Payer: Self-pay | Admitting: *Deleted

## 2013-08-22 MED ORDER — ZOLPIDEM TARTRATE 10 MG PO TABS: 10.0000 mg | ORAL_TABLET | Freq: Every evening | ORAL | Status: DC | PRN

## 2013-08-22 NOTE — Telephone Encounter (Signed)
Pt called and check the status of her Rx for Ambien. Please advise

## 2013-08-22 NOTE — Telephone Encounter (Signed)
rx refill- Ambien 10 mg  Last ov- 02/20/13 Future OV- 09/12/13  Last refilled -04/20/13 #30 / 3 rf No UDS on file .  Please advise . DJR  

## 2013-08-22 NOTE — Addendum Note (Signed)
Addended by: Willow Ora E on: 08/22/2013 03:04 PM   Modules accepted: Orders

## 2013-08-22 NOTE — Telephone Encounter (Signed)
Ok RF will discuss UDS on RTC

## 2013-09-12 ENCOUNTER — Encounter: Payer: Self-pay | Admitting: Internal Medicine

## 2013-09-12 ENCOUNTER — Ambulatory Visit (INDEPENDENT_AMBULATORY_CARE_PROVIDER_SITE_OTHER): Payer: Medicare Other | Admitting: Internal Medicine

## 2013-09-12 VITALS — BP 123/71 | HR 58 | Temp 98.5°F | Wt 137.6 lb

## 2013-09-12 DIAGNOSIS — I10 Essential (primary) hypertension: Secondary | ICD-10-CM

## 2013-09-12 DIAGNOSIS — E119 Type 2 diabetes mellitus without complications: Secondary | ICD-10-CM

## 2013-09-12 DIAGNOSIS — Z23 Encounter for immunization: Secondary | ICD-10-CM

## 2013-09-12 DIAGNOSIS — K3184 Gastroparesis: Secondary | ICD-10-CM

## 2013-09-12 DIAGNOSIS — E785 Hyperlipidemia, unspecified: Secondary | ICD-10-CM

## 2013-09-12 DIAGNOSIS — G4700 Insomnia, unspecified: Secondary | ICD-10-CM

## 2013-09-12 LAB — CBC WITH DIFFERENTIAL/PLATELET
Basophils Absolute: 0 10*3/uL (ref 0.0–0.1)
HCT: 39.7 % (ref 36.0–46.0)
Lymphs Abs: 2 10*3/uL (ref 0.7–4.0)
MCV: 90.7 fl (ref 78.0–100.0)
Monocytes Absolute: 0.4 10*3/uL (ref 0.1–1.0)
Monocytes Relative: 5.7 % (ref 3.0–12.0)
Platelets: 304 10*3/uL (ref 150.0–400.0)
RDW: 13.1 % (ref 11.5–14.6)

## 2013-09-12 LAB — LIPID PANEL
Cholesterol: 184 mg/dL (ref 0–200)
HDL: 93.7 mg/dL (ref >39.00)
LDL Cholesterol: 74 mg/dL (ref 0–99)
Total CHOL/HDL Ratio: 2
VLDL: 16.6 mg/dL (ref 0.0–40.0)

## 2013-09-12 LAB — ALT: ALT: 21 U/L (ref 0–35)

## 2013-09-12 LAB — HEMOGLOBIN A1C: Hgb A1c MFr Bld: 7.1 % — ABNORMAL HIGH (ref 4.6–6.5)

## 2013-09-12 LAB — BASIC METABOLIC PANEL
BUN: 10 mg/dL (ref 6–23)
Creatinine, Ser: 0.7 mg/dL (ref 0.4–1.2)
GFR: 115.15 mL/min (ref >60.00)
Glucose, Bld: 146 mg/dL — ABNORMAL HIGH (ref 70–99)
Potassium: 3.3 mEq/L — ABNORMAL LOW (ref 3.5–5.1)

## 2013-09-12 MED ORDER — PRAVASTATIN SODIUM 20 MG PO TABS: 20.0000 mg | ORAL_TABLET | Freq: Every day | ORAL | Status: DC

## 2013-09-12 NOTE — Assessment & Plan Note (Signed)
Well-controlled, no change, labs 

## 2013-09-12 NOTE — Assessment & Plan Note (Signed)
Ok on ambien.  

## 2013-09-12 NOTE — Assessment & Plan Note (Signed)
On Pravachol, refill, labs.

## 2013-09-12 NOTE — Patient Instructions (Signed)
Get your blood work before you leave  Next visit in 3-4  for a physical exam. No fasting Please make an appointment     ICE and Tylenol  500 mg OTC 2 tabs a day every 8 hours as needed for pain     Diabetes and Foot Care Diabetes may cause you to have a poor blood supply (circulation) to your legs and feet. Because of this, the skin may be thinner, break easier, and heal more slowly. You also may have nerve damage in your legs and feet causing decreased feeling. You may not notice minor injuries to your feet that could lead to serious problems or infections. Taking care of your feet is one of the most important things you can do for yourself.  HOME CARE INSTRUCTIONS  Do not go barefoot. Bare feet are easily injured.  Check your feet daily for blisters, cuts, and redness.  Wash your feet with warm water (not hot) and mild soap. Pat your feet and between your toes until completely dry.  Apply a moisturizing lotion that does not contain alcohol or petroleum jelly to the dry skin on your feet and to dry brittle toenails. Do not put it between your toes.  Trim your toenails straight across. Do not dig under them or around the cuticle.  Do not cut corns or calluses, or try to remove them with medicine.  Wear clean cotton socks or stockings every day. Make sure they are not too tight. Do not wear knee high stockings since they may decrease blood flow to your legs.  Wear leather shoes that fit properly and have enough cushioning. To break in new shoes, wear them just a few hours a day to avoid injuring your feet.  Wear shoes at all times, even in the house.  Do not cross your legs. This may decrease the blood flow to your feet.  If you find a minor scrape, cut, or break in the skin on your feet, keep it and the skin around it clean and dry. These areas may be cleansed with mild soap and water. Do not use peroxide, alcohol, iodine or Merthiolate.  When you remove an adhesive bandage, be  sure not to harm the skin around it.  If you have a wound, look at it several times a day to make sure it is healing.  Do not use heating pads or hot water bottles. Burns can occur. If you have lost feeling in your feet or legs, you may not know it is happening until it is too late.  Report any cuts, sores or bruises to your caregiver. Do not wait! SEEK MEDICAL CARE IF:   You have an injury that is not healing or you notice redness, numbness, burning, or tingling.  Your feet always feel cold.  You have pain or cramps in your legs and feet. SEEK IMMEDIATE MEDICAL CARE IF:   There is increasing redness, swelling, or increasing pain in the wound.  There is a red line that goes up your leg.  Pus is coming from a wound.  You develop an unexplained oral temperature above 102 F (38.9 C), or as your caregiver suggests.  You notice a bad smell coming from an ulcer or wound. MAKE SURE YOU:   Understand these instructions.  Will watch your condition.  Will get help right away if you are not doing well or get worse. Document Released: 10/29/2000 Document Revised: 01/24/2012 Document Reviewed: 05/07/2009 [facility] Patient Information 2014 [place], [place].

## 2013-09-12 NOTE — Progress Notes (Signed)
  Subjective:    Patient ID: [name] [name], female    DOB: 11/29/1945, 67 y.o.   MRN: [id number]  HPI   ROV Diabetes--metformin dose was decreased to 750 mg twice a day, but currently taking 500 mg twice a day. CBGs 150, 115 the mornings Gastroparesis-- taking Reglan as needed, good results Insomnia--symptoms okay with Ambien Few days ago went to see the orthopedic doctor after she injured her knee, they drained some fluid from the left knee and injected cortisone, feeling better but still hurts some.  Past Medical History  Diagnosis Date  . Diabetes mellitus     w/ neuropathy  . Hyperlipidemia   . Hypertension   . Osteoarthritis     gout, CPPD--- sees Dr.Davenshwar  . Osteopenia     per DEXA 4/10  . Recurrent boils     h/o MRSA  . Chest pain 06/2007    CP and QW on EKG: neg stress test   . Diverticulitis 2000  . Insomnia 02/20/2013   Past Surgical History  Procedure Laterality Date  . Cholecystectomy    . Tonsillectomy     History   Social History  . Marital Status: Single    Spouse Name: N/A    Number of Children: N/A  . Years of Education: N/A   Occupational History  . 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 Smoking status: Never Smoker   . Smokeless tobacco: Not on file  . Alcohol Use: No  . Drug Use: No  . Sexual Activity: Not on file   Other Topics Concern  . Not on file   Social History Narrative   Children x 3, 7 GK   Stays active            Review of Systems No  CP, SOB, lower extremity edema Denies  nausea, vomiting diarrhea Denies  blood in the stools No back pain  Reports that from time to time the toes of the left foot feel "different", can't explain better, no numbness. Right foot with no symptoms     Objective:   Physical Exam BP 123/71  Pulse 58  Temp(Src) 98.5 F (36.9 C)  Wt 137 lb 9.6 oz (62.415 kg)  BMI 24.75 kg/m2  SpO2 98% General -- alert,  well-developed, NAD.  Lungs -- normal respiratory effort, no intercostal retractions, no accessory muscle use, and normal breath sounds.  Heart-- normal rate, regular rhythm, no murmur.  DIABETIC FEET EXAM: No lower extremity edema Normal pedal pulses bilaterally Skin normal, nails normal, no calluses Pinprick examination of the feet normal. Neurologic--  alert & oriented X3. Speech normal, gait normal, strength normal in all extremities.  Psych-- Cognition and judgment appear intact. Cooperative with normal attention span and concentration. No anxious appearing , no depressed appearing.     Assessment & Plan:  Knee pain, recommend ice, Tylenol, back to ortho if symptoms persist

## 2013-09-12 NOTE — Assessment & Plan Note (Addendum)
Currently taking only 500 mg of metformin twice a day. Last microalbumin negative. Pinprick examination of the negative however she has some paresthesias, feet care discussed Check the A1c, further advice would results (pt is reluctant to add another med or even increase metformin dose, will see how a1c come back)

## 2013-09-12 NOTE — Assessment & Plan Note (Signed)
Doing okay with Reglan when necessary

## 2013-09-18 ENCOUNTER — Telehealth: Payer: Self-pay | Admitting: *Deleted

## 2013-09-18 MED ORDER — METFORMIN HCL 850 MG PO TABS: 850.0000 mg | ORAL_TABLET | Freq: Two times a day (BID) | ORAL | Status: DC

## 2013-09-18 MED ORDER — ZOSTER VACCINE LIVE 19400 UNT/0.65ML ~~LOC~~ SOLR: 0.6500 mL | Freq: Once | SUBCUTANEOUS | Status: AC

## 2013-09-18 NOTE — Telephone Encounter (Signed)
New rx sent. Pt notified . Pt also requesting another zoster vaccine order to her pharmacy  (Lost her order sheet from a year ago). please advise DJR

## 2013-09-18 NOTE — Telephone Encounter (Signed)
Message copied by Eustace Quail on Tue Sep 18, 2013 11:51 AM ------      Message from: Willow Ora E      Created: Fri Sep 14, 2013  4:57 PM       Please call the patient.      Diabetes needs better control, increase metformin from  500 mg twice a day to metformin 850 mg twice a day, send a new prescription.      Potassium is a little low , mail a high potassium diet.      Her cholesterol is excellent, continue with Pravachol.      All other tests normal.      Next visit in 3-4 months as recommended       ------

## 2013-09-18 NOTE — Addendum Note (Signed)
Addended by: Willow Ora E on: 09/18/2013 12:10 PM   Modules accepted: Orders

## 2013-09-18 NOTE — Telephone Encounter (Signed)
rx printed, please fax

## 2013-10-08 ENCOUNTER — Other Ambulatory Visit: Payer: Self-pay | Admitting: Internal Medicine

## 2013-10-18 ENCOUNTER — Other Ambulatory Visit: Payer: Self-pay | Admitting: Internal Medicine

## 2013-10-19 ENCOUNTER — Telehealth: Payer: Self-pay | Admitting: *Deleted

## 2013-10-19 MED ORDER — ZOLPIDEM TARTRATE 10 MG PO TABS: 5.0000 mg | ORAL_TABLET | Freq: Every evening | ORAL | Status: DC | PRN

## 2013-10-19 NOTE — Telephone Encounter (Signed)
done

## 2013-10-19 NOTE — Telephone Encounter (Signed)
Ambien faxed to patients pharmacy

## 2013-10-19 NOTE — Telephone Encounter (Signed)
zolpidem (AMBIEN) 10 MG tablet Last refill: 08/22/13 #30, x 1 refills Last OV: 09/12/13

## 2013-10-31 ENCOUNTER — Other Ambulatory Visit: Payer: Self-pay | Admitting: Internal Medicine

## 2013-11-01 NOTE — Telephone Encounter (Signed)
rx refill per protocol. DJR  

## 2013-11-03 ENCOUNTER — Other Ambulatory Visit: Payer: Self-pay | Admitting: Internal Medicine

## 2013-11-05 NOTE — Telephone Encounter (Signed)
rx refilled per protocol. DJR  

## 2013-12-06 ENCOUNTER — Encounter: Payer: Self-pay | Admitting: Internal Medicine

## 2014-01-14 ENCOUNTER — Telehealth: Payer: Self-pay

## 2014-01-14 MED ORDER — METFORMIN HCL 850 MG PO TABS: 850.0000 mg | ORAL_TABLET | Freq: Two times a day (BID) | ORAL | Status: DC

## 2014-01-14 NOTE — Telephone Encounter (Signed)
Medication and allergies:  Reviewed and updated  90 day supply/mail order:  [facility]: [facility] [address]   Immunizations due:  Shingles  A/P:   No changes to FH, PSH or Personal Hx Flu vaccine--08/2013 Tdap--07/2010 PNA--03/2012 Shingles--due Pap--gyn [facility]--07/2013--neg bi rads Bone Density--03/2012--osteopenia [facility]--09/2010--Dr Hayes--next due 2016  To Discuss with Provider: Needs refill on sleep aid--thinks her insurance will no longer cover ambien Shingles--could not get before due to $50 co-pay--possibly paper Rx so pharmacy can admin now Sent a 90 day supply of Metformin to local pharmacy. Patient has not taken Rx since Friday. Ran out.

## 2014-01-15 ENCOUNTER — Ambulatory Visit (INDEPENDENT_AMBULATORY_CARE_PROVIDER_SITE_OTHER): Payer: Medicare Other | Admitting: Internal Medicine

## 2014-01-15 ENCOUNTER — Encounter: Payer: Self-pay | Admitting: Internal Medicine

## 2014-01-15 VITALS — BP 124/77 | HR 84 | Temp 98.2°F | Ht 62.8 in | Wt 137.0 lb

## 2014-01-15 DIAGNOSIS — I10 Essential (primary) hypertension: Secondary | ICD-10-CM

## 2014-01-15 DIAGNOSIS — M899 Disorder of bone, unspecified: Secondary | ICD-10-CM

## 2014-01-15 DIAGNOSIS — M949 Disorder of cartilage, unspecified: Secondary | ICD-10-CM

## 2014-01-15 DIAGNOSIS — Z23 Encounter for immunization: Secondary | ICD-10-CM

## 2014-01-15 DIAGNOSIS — E119 Type 2 diabetes mellitus without complications: Secondary | ICD-10-CM

## 2014-01-15 DIAGNOSIS — Z0000 Encounter for general adult medical examination without abnormal findings: Secondary | ICD-10-CM

## 2014-01-15 DIAGNOSIS — G4700 Insomnia, unspecified: Secondary | ICD-10-CM

## 2014-01-15 LAB — BASIC METABOLIC PANEL
BUN: 10 mg/dL (ref 6–23)
CALCIUM: 9.8 mg/dL (ref 8.4–10.5)
CO2: 26 mEq/L (ref 19–32)
CREATININE: 0.7 mg/dL (ref 0.4–1.2)
Chloride: 102 mEq/L (ref 96–112)
GFR: 115.03 mL/min (ref >60.00)
Glucose, Bld: 115 mg/dL — ABNORMAL HIGH (ref 70–99)
POTASSIUM: 3.6 meq/L (ref 3.5–5.1)
Sodium: 137 mEq/L (ref 135–145)

## 2014-01-15 LAB — HEMOGLOBIN A1C: Hgb A1c MFr Bld: 7.3 % — ABNORMAL HIGH (ref 4.6–6.5)

## 2014-01-15 LAB — AST: AST: 23 U/L (ref 0–37)

## 2014-01-15 LAB — ALT: ALT: 28 U/L (ref 0–35)

## 2014-01-15 MED ORDER — ZOSTER VACCINE LIVE 19400 UNT/0.65ML ~~LOC~~ SOLR: 0.6500 mL | Freq: Once | SUBCUTANEOUS | Status: DC

## 2014-01-15 MED ORDER — METFORMIN HCL 850 MG PO TABS: 850.0000 mg | ORAL_TABLET | Freq: Two times a day (BID) | ORAL | Status: DC

## 2014-01-15 NOTE — Assessment & Plan Note (Signed)
Bone density test with a T score of -1.3  (03-2012.) Recommend calcium, vitamin D and exercise

## 2014-01-15 NOTE — Progress Notes (Signed)
Subjective:    Patient ID: [name] [name], female    DOB: 03/31/1946, 68 y.o.   MRN: [id number]  DOS:  01/15/2014 Type of  visit:  Here for Medicare AWV: 1. Risk factors based on Past M, S, F history: reviewed 2. Physical Activities:  occ take walks, does some yard work , overall less active  3. Depression/mood:  No depression or anxiety    4. Hearing:  No problemss noted or reported   5. ADL's:  Independent , drives   6. Fall Risk: no recent falls , prevention discussed 7. home Safety: does feelsafe at home   8. Height, weight, &visual acuity: see VS, sees eye doctor regularly   9. Counseling: provided 10. Labs ordered based on risk factors: if needed   11. Referral Coordination: if needed 12.  Care Plan, see assessment and plan   13.   Cognitive Assessment: Motor skills and cognition within normal for age  In addition, today we discussed the following: Diabetes--good medication compliance, ambulatory to sugars around 130s High cholesterol--good medication compliance Hypertension--on Cardizem, ambulatory BPs within normal when checked. Insomnia ----takes Ambien as needed, 0.5-1 tablet at bedtime. Cost may become an issue, see assessment and plan.   ROS No fever, chills  No  CP, SOB No palpitations  Denies  nausea, vomiting diarrhea Denies  blood in the stools (-) cough, sputum production (-) wheezing, chest congestion No dysuria, gross hematuria, difficulty urinating  + stress d/t problems w/ her son  but no  anxiety, depression per se    Past Medical History  Diagnosis Date  . Diabetes mellitus     w/ neuropathy  . Hyperlipidemia   . Hypertension   . Osteoarthritis     gout, CPPD--- sees Dr.Davenshwar  . Osteopenia     per DEXA 4/10  . Recurrent boils     h/o MRSA  . Chest pain 06/2007    CP and QW on EKG: neg stress test   . Diverticulitis 2000  . Insomnia 02/20/2013    Past Surgical History  Procedure Laterality Date  . Cholecystectomy    .  Tonsillectomy      History   Social History  . Marital Status: Single    Spouse Name: N/A    Number of Children: 3  . Years of Education: N/A   Occupational History  . 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 Smoking status: Never Smoker   . Smokeless tobacco: Never Used  . Alcohol Use: No  . Drug Use: No  . Sexual Activity: Not on file   Other Topics Concern  . Not on file   Social History Narrative   Children x 3, 7 GK   Lives w/ husband               Family History  Problem Relation Age of Onset  . Hypertension Daughter   . Hypertension Mother   . Hypertension Father   . Hypertension Sister   . Diabetes Mother   . Diabetes Father   . Diabetes Sister   . Coronary artery disease Neg Hx   . Stroke Neg Hx   . Colon cancer Neg Hx   . Breast cancer Neg Hx        Medication List       This list is accurate as of: 01/15/14  3:17 PM.  Always use your most recent med list.               aspirin 81 MG tablet  Take 81 mg by mouth daily.     CLARITIN PO  Take by mouth daily.     diltiazem 300 MG 24 hr capsule  Commonly known as:  CARDIZEM CD  TAKE 1 CAPSULE EVERY DAY     FREESTYLE TEST STRIPS test strip  Generic drug:  glucose blood  USE AS DIRECTED 1 TO 2 TIMES DAILY     metFORMIN 850 MG tablet  Commonly known as:  GLUCOPHAGE  Take 1 tablet (850 mg total) by mouth 2 (two) times daily with a meal.     metoCLOPramide 5 MG tablet  Commonly known as:  REGLAN  Take 1 tablet (5 mg total) by mouth 3 (three) times daily.     pravastatin 20 MG tablet  Commonly known as:  PRAVACHOL  Take 1 tablet (20 mg total) by mouth at bedtime.     Vitamin D 2000 UNITS Caps  Take by mouth.     zolpidem 10 MG tablet  Commonly known as:  AMBIEN  Take 0.5-1 tablets (5-10 mg total) by mouth at bedtime as needed for sleep.     zoster vaccine live (PF) 19400  UNT/0.65ML injection  Commonly known as:  ZOSTAVAX  Inject 19,400 Units into the skin once.           Objective:   Physical Exam BP 124/77  Pulse 84  Temp(Src) 98.2 F (36.8 C)  Ht 5' 2.8" (1.595 m)  Wt 137 lb (62.143 kg)  BMI 24.43 kg/m2  SpO2 98% General -- alert, well-developed, NAD.  Neck --no thyromegaly , normal carotid pulse  HEENT-- Not pale.  Lungs -- normal respiratory effort, no intercostal retractions, no accessory muscle use, and normal breath sounds.  Heart-- normal rate, regular rhythm, no murmur.  Abdomen-- Not distended, good bowel sounds,soft, non-tender. Extremities-- no pretibial edema bilaterally  Neurologic--  alert & oriented X3. Speech normal, gait normal, strength normal in all extremities. Psych-- Cognition and judgment appear intact. Cooperative with normal attention span and concentration. No anxious or depressed appearing.     Assessment & Plan:

## 2014-01-15 NOTE — Assessment & Plan Note (Signed)
Seems well controlled, check a BMP 

## 2014-01-15 NOTE — Assessment & Plan Note (Signed)
Seems to be doing well, check a hemoglobin A1c

## 2014-01-15 NOTE — Assessment & Plan Note (Signed)
Well-controlled with Ambien as needed, cost may become an issue, if that is the case she will call and will switch to alprazolam

## 2014-01-15 NOTE — Patient Instructions (Signed)
Get your blood work before you leave   Next visit is for routine check up regards your blood sugar , blood pressure    in 4-5 months Fasting Please make an appointment       Fall Prevention and Home Safety Falls cause injuries and can affect all age groups. It is possible to use preventive measures to significantly decrease the likelihood of falls. There are many simple measures which can make your home safer and prevent falls. OUTDOORS  Repair cracks and edges of walkways and driveways.  Remove high doorway thresholds.  Trim shrubbery on the main path into your home.  Have good outside lighting.  Clear walkways of tools, rocks, debris, and clutter.  Check that handrails are not broken and are securely fastened. Both sides of steps should have handrails.  Have leaves, snow, and ice cleared regularly.  Use sand or salt on walkways during winter months.  In the garage, clean up grease or oil spills. BATHROOM  Install night lights.  Install grab bars by the toilet and in the tub and shower.  Use non-skid mats or decals in the tub or shower.  Place a plastic non-slip stool in the shower to sit on, if needed.  Keep floors dry and clean up all water on the floor immediately.  Remove soap buildup in the tub or shower on a regular basis.  Secure bath mats with non-slip, double-sided rug tape.  Remove throw rugs and tripping hazards from the floors. BEDROOMS  Install night lights.  Make sure a bedside light is easy to reach.  Do not use oversized bedding.  Keep a telephone by your bedside.  Have a firm chair with side arms to use for getting dressed.  Remove throw rugs and tripping hazards from the floor. KITCHEN  Keep handles on pots and pans turned toward the center of the stove. Use back burners when possible.  Clean up spills quickly and allow time for drying.  Avoid walking on wet floors.  Avoid hot utensils and knives.  Position shelves so they are  not too high or low.  Place commonly used objects within easy reach.  If necessary, use a sturdy step stool with a grab bar when reaching.  Keep electrical cables out of the way.  Do not use floor polish or wax that makes floors slippery. If you must use wax, use non-skid floor wax.  Remove throw rugs and tripping hazards from the floor. STAIRWAYS  Never leave objects on stairs.  Place handrails on both sides of stairways and use them. Fix any loose handrails. Make sure handrails on both sides of the stairways are as long as the stairs.  Check carpeting to make sure it is firmly attached along stairs. Make repairs to worn or loose carpet promptly.  Avoid placing throw rugs at the top or bottom of stairways, or properly secure the rug with carpet tape to prevent slippage. Get rid of throw rugs, if possible.  Have an electrician put in a light switch at the top and bottom of the stairs. OTHER FALL PREVENTION TIPS  Wear low-heel or rubber-soled shoes that are supportive and fit well. Wear closed toe shoes.  When using a stepladder, make sure it is fully opened and both spreaders are firmly locked. Do not climb a closed stepladder.  Add color or contrast paint or tape to grab bars and handrails in your home. Place contrasting color strips on first and last steps.  Learn and use mobility aids as  needed. Install an electrical emergency response system.  Turn on lights to avoid dark areas. Replace light bulbs that burn out immediately. Get light switches that glow.  Arrange furniture to create clear pathways. Keep furniture in the same place.  Firmly attach carpet with non-skid or double-sided tape.  Eliminate uneven floor surfaces.  Select a carpet pattern that does not visually hide the edge of steps.  Be aware of all pets. OTHER HOME SAFETY TIPS  Set the water temperature for 120 F (48.8 C).  Keep emergency numbers on or near the telephone.  Keep smoke detectors on  every level of the home and near sleeping areas. Document Released: 10/22/2002 Document Revised: 05/02/2012 Document Reviewed: 01/21/2012 [facility] Patient Information 2014 [place], [place].

## 2014-01-15 NOTE — Assessment & Plan Note (Signed)
Td 2011 pneumonia shot 2008 and 2013 (PNM 23)-- provided PNM 13  Had a flu shot  shingles immunization ---  Rx provided   Cscope 2006, and  November 2011, next 2016 has not seen gyn in years---used to see  Dr Gaynell FaceMarshall --- arrange referral mammogram  neg  06-2013 Diet-exercise discussed

## 2014-01-15 NOTE — Progress Notes (Signed)
Pre visit review using our clinic review tool, if applicable. No additional management support is needed unless otherwise documented below in the visit note. 

## 2014-01-16 ENCOUNTER — Telehealth: Payer: Self-pay | Admitting: Internal Medicine

## 2014-01-16 NOTE — Telephone Encounter (Signed)
Relevant patient education mailed to patient.  

## 2014-01-18 MED ORDER — SITAGLIPTIN PHOSPHATE 100 MG PO TABS: 100.0000 mg | ORAL_TABLET | Freq: Every day | ORAL | Status: DC

## 2014-01-18 NOTE — Addendum Note (Signed)
Addended by: Eustace QuailEABOLD, Jazmyne Beauchesne J on: 01/18/2014 09:12 AM   Modules accepted: Orders

## 2014-01-25 ENCOUNTER — Other Ambulatory Visit: Payer: Self-pay | Admitting: Internal Medicine

## 2014-02-06 ENCOUNTER — Other Ambulatory Visit: Payer: Self-pay | Admitting: Internal Medicine

## 2014-02-06 NOTE — Telephone Encounter (Signed)
Patient called to check on her refill status for diltiazem (CARDIZEM CD) 300 MG 24 hr capsule. Patient stated that she did not have a pill to take lastnight and she really needs them. Please advise.

## 2014-02-07 NOTE — Telephone Encounter (Signed)
Done

## 2014-05-16 LAB — HM DIABETES EYE EXAM

## 2014-05-17 ENCOUNTER — Other Ambulatory Visit: Payer: Self-pay | Admitting: Internal Medicine

## 2014-05-17 DIAGNOSIS — E785 Hyperlipidemia, unspecified: Secondary | ICD-10-CM

## 2014-05-20 NOTE — Telephone Encounter (Signed)
[facility] [address]

## 2014-05-21 ENCOUNTER — Ambulatory Visit (INDEPENDENT_AMBULATORY_CARE_PROVIDER_SITE_OTHER): Payer: 59 | Admitting: Internal Medicine

## 2014-05-21 ENCOUNTER — Encounter: Payer: Self-pay | Admitting: Internal Medicine

## 2014-05-21 VITALS — BP 112/69 | HR 80 | Temp 98.0°F | Wt 140.0 lb

## 2014-05-21 DIAGNOSIS — R202 Paresthesia of skin: Secondary | ICD-10-CM

## 2014-05-21 DIAGNOSIS — G4700 Insomnia, unspecified: Secondary | ICD-10-CM

## 2014-05-21 DIAGNOSIS — M1120 Other chondrocalcinosis, unspecified site: Secondary | ICD-10-CM

## 2014-05-21 DIAGNOSIS — E119 Type 2 diabetes mellitus without complications: Secondary | ICD-10-CM

## 2014-05-21 DIAGNOSIS — R209 Unspecified disturbances of skin sensation: Secondary | ICD-10-CM

## 2014-05-21 LAB — CBC WITH DIFFERENTIAL/PLATELET
BASOS PCT: 1.2 % (ref 0.0–3.0)
Basophils Absolute: 0 10*3/uL (ref 0.0–0.1)
EOS ABS: 0.2 10*3/uL (ref 0.0–0.7)
Eosinophils Relative: 4.7 % (ref 0.0–5.0)
HCT: 38.1 % (ref 36.0–46.0)
Hemoglobin: 12.6 g/dL (ref 12.0–15.0)
LYMPHS PCT: 38.3 % (ref 12.0–46.0)
Lymphs Abs: 1.5 10*3/uL (ref 0.7–4.0)
MCHC: 33.1 g/dL (ref 30.0–36.0)
MCV: 90.6 fl (ref 78.0–100.0)
MONOS PCT: 6 % (ref 3.0–12.0)
Monocytes Absolute: 0.2 10*3/uL (ref 0.1–1.0)
NEUTROS ABS: 2 10*3/uL (ref 1.4–7.7)
NEUTROS PCT: 49.8 % (ref 43.0–77.0)
Platelets: 280 10*3/uL (ref 150.0–400.0)
RBC: 4.2 Mil/uL (ref 3.87–5.11)
RDW: 13.8 % (ref 11.5–15.5)
WBC: 4 10*3/uL (ref 4.0–10.5)

## 2014-05-21 LAB — COMPREHENSIVE METABOLIC PANEL
ALK PHOS: 66 U/L (ref 39–117)
ALT: 18 U/L (ref 0–35)
AST: 19 U/L (ref 0–37)
Albumin: 4.1 g/dL (ref 3.5–5.2)
BILIRUBIN TOTAL: 0.4 mg/dL (ref 0.2–1.2)
BUN: 11 mg/dL (ref 6–23)
CALCIUM: 9.6 mg/dL (ref 8.4–10.5)
CO2: 25 mEq/L (ref 19–32)
Chloride: 104 mEq/L (ref 96–112)
Creatinine, Ser: 0.6 mg/dL (ref 0.4–1.2)
GFR: 121.25 mL/min (ref >60.00)
Glucose, Bld: 135 mg/dL — ABNORMAL HIGH (ref 70–99)
Potassium: 3.6 mEq/L (ref 3.5–5.1)
Sodium: 139 mEq/L (ref 135–145)
TOTAL PROTEIN: 7.3 g/dL (ref 6.0–8.3)

## 2014-05-21 LAB — HEMOGLOBIN A1C: Hgb A1c MFr Bld: 7.1 % — ABNORMAL HIGH (ref 4.6–6.5)

## 2014-05-21 LAB — FOLATE: FOLATE: 22.9 ng/mL (ref >5.9)

## 2014-05-21 LAB — URIC ACID: URIC ACID, SERUM: 6 mg/dL (ref 2.4–7.0)

## 2014-05-21 LAB — VITAMIN B12: VITAMIN B 12: 654 pg/mL (ref 211–911)

## 2014-05-21 MED ORDER — ALPRAZOLAM 0.5 MG PO TABS: 0.2500 mg | ORAL_TABLET | Freq: Every evening | ORAL | Status: DC | PRN

## 2014-05-21 NOTE — Assessment & Plan Note (Signed)
Discontinue Ambien d/t  cost, trial with Xanax.

## 2014-05-21 NOTE — Patient Instructions (Signed)
Get your blood work before you leave   Stop ambien , start  Alprazolam, watch for excessive somnolence  Next visit is for routine check up regards your blood sugar , blood pressure in 4 months

## 2014-05-21 NOTE — Assessment & Plan Note (Signed)
Followup by rheumatology, they're requesting a CMP, CBC and uric acid

## 2014-05-21 NOTE — Progress Notes (Signed)
Pre-visit discussion using our clinic review tool. No additional management support is needed unless otherwise documented below in the visit note.  

## 2014-05-21 NOTE — Assessment & Plan Note (Signed)
Paresthesia @ the  left arm<<hand  for a few months, orthopedic surgery referred her  to neurology for nerve conduction study ---> reportedly okay. Plan: Will check a B12 and folic acid.

## 2014-05-21 NOTE — Assessment & Plan Note (Addendum)
Based on the last A1c she was recommended Januvia, she never tried,  Concerned about "bad reports" about the drug; counseled about medications benefits >> risks. Unable to exercise much but she remains active. Plan: Check A1c

## 2014-05-21 NOTE — Progress Notes (Signed)
Subjective:    Patient ID: [name] [name], female    DOB: 03/28/1946, 68 y.o.   MRN: [id number]  DOS:  05/21/2014 Type of visit - description: routine History: Diabetes, did not start taking Januvia, CBGs around 130. Insomnia, unable to afford Ambien (cost!) like to try something else.  arthralgias, followup by rheumatology, needs labs Also, having on and off tingling in the left arm, particularly in the left hand. Symptoms last one or 2 minutes, decrease when she shakes her hand.   ROS Denies chest pain or difficulty breathing No anxiety- depression No diplopia, sore speech, facial numbness, motor deficits bladder or bowel incontinence  Past Medical History  Diagnosis Date  . Diabetes mellitus     w/ neuropathy  . Hyperlipidemia   . Hypertension   . Osteoarthritis     gout, CPPD--- sees Dr.Davenshwar  . Osteopenia     per DEXA 4/10  . Recurrent boils     h/o MRSA  . Chest pain 06/2007    CP and QW on EKG: neg stress test   . Diverticulitis 2000  . Insomnia 02/20/2013    Past Surgical History  Procedure Laterality Date  . Cholecystectomy    . Tonsillectomy      History   Social History  . Marital Status: Single    Spouse Name: N/A    Number of Children: 3  . Years of Education: N/A   Occupational History  . 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 Smoking status: Never Smoker   . Smokeless tobacco: Never Used  . Alcohol Use: No  . Drug Use: No  . Sexual Activity: Not on file   Other Topics Concern  . Not on file   Social History Narrative   Children x 3, 7 GK   Lives w/ husband                  Medication List       This list is accurate as of: 05/21/14  5:54 PM.  Always use your most recent med list.               ALPRAZolam 0.5 MG tablet  Commonly known as:  XANAX  Take 0.5-1 tablets (0.25-0.5 mg total) by mouth at bedtime as  needed for sleep.     aspirin 81 MG tablet  Take 81 mg by mouth daily.     CLARITIN PO  Take by mouth daily.     diltiazem 300 MG 24 hr capsule  Commonly known as:  CARDIZEM CD  TAKE 1 CAPSULE EVERY DAY     FREESTYLE TEST STRIPS test strip  Generic drug:  glucose blood  USE AS DIRECTED 1 TO 2 TIMES DAILY     metFORMIN 850 MG tablet  Commonly known as:  GLUCOPHAGE  Take 1 tablet (850 mg total) by mouth 2 (two) times daily with a meal.     metoCLOPramide 5 MG tablet  Commonly known as:  REGLAN  Take 1 tablet (5 mg total) by mouth 3 (three) times daily.     pravastatin 20 MG tablet  Commonly known as:  PRAVACHOL  TAKE 1 TABLET (20 MG TOTAL) BY MOUTH AT BEDTIME.     Vitamin D 2000 UNITS Caps  Take by mouth.     zoster vaccine live (PF) 19400 UNT/0.65ML injection  Commonly known as:  ZOSTAVAX  Inject 19,400 Units into the skin once.           Objective:   Physical Exam BP 112/69  Pulse 80  Temp(Src) 98 F (36.7 C) (Oral)  Wt 140 lb (63.504 kg)  SpO2 97%  General -- alert, well-developed, NAD.   neck-- FROM, no  TTP Lungs -- normal respiratory effort, no intercostal retractions, no accessory muscle use, and normal breath sounds.  Heart-- normal rate, regular rhythm, no murmur.  Extremities-- no pretibial edema bilaterally  Neurologic--  alert & oriented X3. Speech normal, gait appropriate for age, strength symmetric and appropriate for age.  DTRs symmetric. EOMI  Psych-- Cognition and judgment appear intact. Cooperative with normal attention span and concentration. No anxious or depressed appearing.         Assessment & Plan:

## 2014-05-24 NOTE — Addendum Note (Signed)
Addended by: Eustace QuailEABOLD, Macintyre Alexa J on: 05/24/2014 02:30 PM   Modules accepted: Orders

## 2014-06-05 ENCOUNTER — Ambulatory Visit (INDEPENDENT_AMBULATORY_CARE_PROVIDER_SITE_OTHER): Payer: 59 | Admitting: Neurology

## 2014-06-05 ENCOUNTER — Encounter: Payer: Self-pay | Admitting: Neurology

## 2014-06-05 VITALS — BP 116/73 | HR 85 | Ht 63.0 in | Wt 141.4 lb

## 2014-06-05 DIAGNOSIS — G5780 Other specified mononeuropathies of unspecified lower limb: Secondary | ICD-10-CM

## 2014-06-05 DIAGNOSIS — R200 Anesthesia of skin: Secondary | ICD-10-CM

## 2014-06-05 DIAGNOSIS — R209 Unspecified disturbances of skin sensation: Secondary | ICD-10-CM

## 2014-06-05 DIAGNOSIS — G5782 Other specified mononeuropathies of left lower limb: Secondary | ICD-10-CM | POA: Insufficient documentation

## 2014-06-05 DIAGNOSIS — R202 Paresthesia of skin: Secondary | ICD-10-CM

## 2014-06-05 NOTE — Progress Notes (Signed)
[facility] [address] [place]. [place] [address] [phone]       OFFICE CONSULT NOTE  Ms. Rozell SearingBetty M Mckeel Date of Birth:  01/26/1946 Medical Record Number:  [id number]   Referring MD:  Janalyn RouseShaili deveshwar  Reason for Referral:  Left leg and arm numbness HPI: [age] year African American lady who noticed numbness involving the lateral aspect of the left leg beneath the knee following a local steroid injection 2 months ago for knee pain. This is constant and has not changed. Occasionally she gets tingling and some pain. She had nerve conduction EMG done in lower limbs only  on 03/15/14 by Dr. Naaman PlummerFred Newton which I personally reviewed showed no evidence of peripheral neuropathy or radiculopathy. She is also complaining of intermittent tingling and numbness in the left hand which occurs off and on. There is no specific trigger. Denies any weakness in the hand or significant neck pain or rectal pain. She's had no falls or injuries recently. She says she recently had x-rays of her neck done which showed some arthritic changes but I did not have the films or report to review today. She has had no prior history of stroke, TIA, seizures, head injury were significant neurological problems. She denies significant weakness of her feet and or gait or balance problems. Lab work on 05/21/14 showed normal WBC and basic metabolic panel.  ROS:   14 system review of systems is positive for numbness, and tingling, aching muscles, joint pain, decreased energy, change in appetite and all other systems negative  PMH:  Past Medical History  Diagnosis Date  . Diabetes mellitus     w/ neuropathy  . Hyperlipidemia   . Hypertension   . Osteoarthritis     gout, CPPD--- sees Dr.Davenshwar  . Osteopenia     per DEXA 4/10  . Recurrent boils     h/o MRSA  . Chest pain 06/2007    CP and QW on EKG: neg stress test   . Diverticulitis 2000  . Insomnia 02/20/2013    Social History:  History   Social  History  . Marital Status: Single    Spouse Name: N/A    Number of Children: 3  . Years of Education: college   Occupational History  . 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 Smoking status: Never Smoker   . Smokeless tobacco: Never Used  . Alcohol Use: No  . Drug Use: No  . Sexual Activity: Not on file   Other Topics Concern  . Not on file   Social History Narrative   Children x 3, 7 GK   Lives w/ husband              Medications:   Current Outpatient Prescriptions on File Prior to Visit  Medication Sig Dispense Refill  . ALPRAZolam (XANAX) 0.5 MG tablet Take 0.5-1 tablets (0.25-0.5 mg total) by mouth at bedtime as needed for sleep.  30 tablet  1  . aspirin 81 MG tablet Take 81 mg by mouth daily.        . Cholecalciferol (VITAMIN D) 2000 UNITS CAPS Take by mouth.        . diltiazem (CARDIZEM CD) 300 MG 24 hr capsule TAKE 1 CAPSULE EVERY DAY  30 capsule  3  . FREESTYLE TEST STRIPS test strip USE AS DIRECTED 1 TO 2  TIMES DAILY  50 each  11  . Loratadine (CLARITIN PO) Take by mouth daily.      . metFORMIN (GLUCOPHAGE) 850 MG tablet Take 1.5 tablets twice daily      . metoCLOPramide (REGLAN) 5 MG tablet Take 1 tablet (5 mg total) by mouth 3 (three) times daily.  90 tablet  3  . pravastatin (PRAVACHOL) 20 MG tablet TAKE 1 TABLET (20 MG TOTAL) BY MOUTH AT BEDTIME.  30 tablet  6  . zoster vaccine live, PF, (ZOSTAVAX) 40981 UNT/0.65ML injection Inject 19,400 Units into the skin once.  1 each  0   No current facility-administered medications on file prior to visit.    Allergies:   Allergies  Allergen Reactions  . Celecoxib     REACTION: RASH    Physical Exam General: Frail elderly African American lady seated, in no evident distress Head: head normocephalic and atraumatic. Orohparynx benign Neck: supple with no carotid or supraclavicular bruits Cardiovascular:  regular rate and rhythm, no murmurs Musculoskeletal: no deformity. Left shoulder elevation limited due to pain. Osteoarthritis deformities at finger tips Skin:  no rash/petichiae. Birthmark left arm. Vascular:  Normal pulses all extremities Filed Vitals:   06/05/14 0858  BP: 116/73  Pulse: 85    Neurologic Exam Mental Status: Awake and fully alert. Oriented to place and time. Recent and remote memory intact. Attention span, concentration and fund of knowledge appropriate. Mood and affect appropriate.  Cranial Nerves: Fundoscopic exam reveals sharp disc margins. Pupils equal, briskly reactive to light. Extraocular movements full without nystagmus. Visual fields full to confrontation. Hearing intact. Facial sensation intact. Face, tongue, palate moves normally and symmetrically.  Motor: Normal bulk and tone. Normal strength in all tested extremity muscles. Sensory.: intact to tough and pinprick and vibratory sensation.. Tinel's sign negative over both wrist and left tibial groove. Coordination: Rapid alternating movements normal in all extremities. Finger-to-nose and heel-to-shin performed accurately bilaterally. Gait and Station: Arises from chair without difficulty. Stance is normal. Gait demonstrates normal stride length and balance . Able to heel, toe and tandem walk without difficulty.  Reflexes: 1+ and symmetric. Toes downgoing.       ASSESSMENT: 38 year lady with left foot and hand paresthesias of undetermined etiology. Left lateral leg numbness likely from injury to branch of the lateral peroneal nerve and left hand paresthesias may be from sonic radiculopathy or right brain lesion.    PLAN: I had a long discussion with the patient regarding her numbness in the left foot and tingling in the left arm discussed my differential diagnosis, plan for evaluation, treatment and answered questions. At the present time she wants to hold off on trying medications like gabapentin tolerate the  sensory symptoms are tolerable. Check MRI scan of cervical spine  for radiculopathy an MRI scan the brain for right brain lesion. I advised her to do neck stretching exercises and not to carry a heavy bag predominantly in the left hand and to alternate hands and to lighten her bag. Return for followup in 6 weeks or call earlier if necessary.   Note: This document was prepared with digital dictation and possible smart phrase technology. Any transcriptional errors that result from this process are unintentional.

## 2014-06-05 NOTE — Patient Instructions (Signed)
I had a long discussion with the patient regarding her numbness in the left foot and tingling in the left arm discussed my differential diagnosis, plan for evaluation, treatment and answered questions. At the present time she wants to hold off on trying medications like gabapentin tolerate the sensory symptoms are tolerable. Check MRI scan of cervical spine  for radiculopathy an MRI scan the brain for right brain lesion. I advised her to do neck stretching exercises and not to carry a heavy bag predominantly in the left hand and to alternate hands and to lighten her bag. Return for followup in 6 weeks or call earlier if necessary.

## 2014-06-07 ENCOUNTER — Other Ambulatory Visit: Payer: Self-pay | Admitting: Internal Medicine

## 2014-06-26 ENCOUNTER — Ambulatory Visit (INDEPENDENT_AMBULATORY_CARE_PROVIDER_SITE_OTHER): Payer: 59

## 2014-06-26 DIAGNOSIS — G5780 Other specified mononeuropathies of unspecified lower limb: Secondary | ICD-10-CM

## 2014-06-26 DIAGNOSIS — R202 Paresthesia of skin: Secondary | ICD-10-CM

## 2014-06-26 DIAGNOSIS — R209 Unspecified disturbances of skin sensation: Secondary | ICD-10-CM

## 2014-06-26 DIAGNOSIS — G5782 Other specified mononeuropathies of left lower limb: Secondary | ICD-10-CM

## 2014-06-26 DIAGNOSIS — R200 Anesthesia of skin: Secondary | ICD-10-CM

## 2014-06-26 MED ORDER — GADOPENTETATE DIMEGLUMINE 469.01 MG/ML IV SOLN: 13.0000 mL | Freq: Once | INTRAVENOUS | Status: AC | PRN

## 2014-07-01 ENCOUNTER — Telehealth: Payer: Self-pay | Admitting: *Deleted

## 2014-07-01 NOTE — Telephone Encounter (Signed)
Called patient to schedule an appointment with [name] [name] for tomorrow.. If the patient calls and Im  Not able to pick up the please do so.

## 2014-07-02 ENCOUNTER — Ambulatory Visit (INDEPENDENT_AMBULATORY_CARE_PROVIDER_SITE_OTHER): Payer: Medicare Other | Admitting: Nurse Practitioner

## 2014-07-02 ENCOUNTER — Encounter: Payer: Self-pay | Admitting: Nurse Practitioner

## 2014-07-02 VITALS — BP 141/81 | HR 94 | Ht 62.0 in | Wt 143.8 lb

## 2014-07-02 DIAGNOSIS — R202 Paresthesia of skin: Secondary | ICD-10-CM

## 2014-07-02 DIAGNOSIS — G5780 Other specified mononeuropathies of unspecified lower limb: Secondary | ICD-10-CM

## 2014-07-02 DIAGNOSIS — R200 Anesthesia of skin: Secondary | ICD-10-CM

## 2014-07-02 DIAGNOSIS — R209 Unspecified disturbances of skin sensation: Secondary | ICD-10-CM

## 2014-07-02 DIAGNOSIS — G5782 Other specified mononeuropathies of left lower limb: Secondary | ICD-10-CM

## 2014-07-02 NOTE — Patient Instructions (Signed)
Results of MRI discussed and copies given.    We recommend conservative treatments such as neck exercises and therapies including anti-inflammatories if painful or heat to the area.  Follow up in our office as needed.

## 2014-07-02 NOTE — Progress Notes (Signed)
PATIENT: [name] [name] DOB: [birth year]  REASON FOR VISIT: routine follow up for numbness and tingling in right arm HISTORY FROM: patient  HISTORY OF PRESENT ILLNESS: 34 year African American lady who noticed numbness involving the lateral aspect of the left leg beneath the knee following a local steroid injection 2 months ago for knee pain. This is constant and has not changed. Occasionally she gets tingling and some pain. She had nerve conduction EMG done in lower limbs only  on 03/15/14 by Dr. Naaman Plummer which I personally reviewed showed no evidence of peripheral neuropathy or radiculopathy. She is also complaining of intermittent tingling and numbness in the left hand which occurs off and on. There is no specific trigger. Denies any weakness in the hand or significant neck pain or rectal pain. She's had no falls or injuries recently. She says she recently had x-rays of her neck done which showed some arthritic changes but I did not have the films or report to review today. She has had no prior history of stroke, TIA, seizures, head injury were significant neurological problems. She denies significant weakness of her feet and or gait or balance problems. Lab work on 05/21/14 showed normal WBC and basic metabolic panel.  Update 07/02/14 (LL): Patient comes for revisit after MRI scans completed. 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  She states her symptoms are not severe enough to have any steroid injections and she has PT ordered to start soon.  She denies pain and the numbness and tingling is unchanged.  ROS:   14 system review of systems is positive for numbness, and tingling, snoring and all other systems negative  ALLERGIES: Allergies  Allergen Reactions  . Celecoxib     REACTION: RASH    HOME MEDICATIONS: Outpatient Prescriptions Prior to Visit  Medication  Sig Dispense Refill  . ALPRAZolam (XANAX) 0.5 MG tablet Take 0.5-1 tablets (0.25-0.5 mg total) by mouth at bedtime as needed for sleep.  30 tablet  1  . aspirin 81 MG tablet Take 81 mg by mouth daily.        . Cholecalciferol (VITAMIN D) 2000 UNITS CAPS Take by mouth.        . diltiazem (CARDIZEM CD) 300 MG 24 hr capsule TAKE ONE CAPSULE BY MOUTH DAILY  30 capsule  6  . FREESTYLE TEST STRIPS test strip USE AS DIRECTED 1 TO 2 TIMES DAILY  50 each  11  . Loratadine (CLARITIN PO) Take by mouth daily.      . metFORMIN (GLUCOPHAGE) 850 MG tablet Take 1.5 tablets twice daily      . metoCLOPramide (REGLAN) 5 MG tablet Take 1 tablet (5 mg total) by mouth 3 (three) times daily.  90 tablet  3  . pravastatin (PRAVACHOL) 20 MG tablet TAKE 1 TABLET (20 MG TOTAL) BY MOUTH AT BEDTIME.  30 tablet  6  . zoster vaccine live, PF, (ZOSTAVAX) 16109 UNT/0.65ML injection Inject 19,400 Units into the skin once.  1 each  0   No facility-administered medications prior to visit.    PHYSICAL EXAM Filed Vitals:   07/02/14 1337  BP: 141/81  Pulse: 94  Height: 5\' 2"  (1.575 m)  Weight: 143 lb 12.8 oz (65.227 kg)   Body mass index is 26.29 kg/(m^2).  Physical Exam General:  elderly African American lady seated, in no evident distress Cardiovascular: regular rate and rhythm, no  murmurs Musculoskeletal: no deformity. Left shoulder elevation limited due to pain. Osteoarthritis deformities at finger tips  Neurologic Exam Mental Status: Awake and fully alert. Oriented to place and time. Recent and remote memory intact. Attention span, concentration and fund of knowledge appropriate. Mood and affect appropriate.  Cranial Nerves: Pupils equal, briskly reactive to light. Extraocular movements full without nystagmus. Visual fields full to confrontation. Hearing intact. Facial sensation intact. Face, tongue, palate moves normally and symmetrically.  Motor: Normal bulk and tone. Normal strength in all tested extremity  muscles. Sensory: intact to touch and pinprick and vibratory sensation. Tinel's sign negative over both wrist and left tibial groove. Coordination: Rapid alternating movements normal in all extremities. Finger-to-nose and heel-to-shin performed accurately bilaterally. Gait and Station: Arises from chair without difficulty. Stance is normal. Gait demonstrates normal stride length and balance . Able to heel, toe and tandem walk without difficulty.  Reflexes: 1+ and symmetric. Toes downgoing.    DIAGNOSTIC DATA (LABS, IMAGING, TESTING) - I reviewed patient records, labs, notes, testing and imaging myself where available.  Lab Results  Component Value Date   WBC 4.0 05/21/2014   HGB 12.6 05/21/2014   HCT 38.1 05/21/2014   MCV 90.6 05/21/2014   PLT 280.0 05/21/2014      Component Value Date/Time   NA 139 05/21/2014 1024   K 3.6 05/21/2014 1024   CL 104 05/21/2014 1024   CO2 25 05/21/2014 1024   GLUCOSE 135* 05/21/2014 1024   GLUCOSE 129 07/28/2010   BUN 11 05/21/2014 1024   CREATININE 0.6 05/21/2014 1024   CALCIUM 9.6 05/21/2014 1024   PROT 7.3 05/21/2014 1024   ALBUMIN 4.1 05/21/2014 1024   AST 19 05/21/2014 1024   ALT 18 05/21/2014 1024   ALKPHOS 66 05/21/2014 1024   BILITOT 0.4 05/21/2014 1024   GFRNONAA 127.47 04/14/2010 0850   GFRAA 131 09/13/2008 1048   Lab Results  Component Value Date   CHOL 184 09/12/2013   HDL 93.70 09/12/2013   LDLCALC 74 09/12/2013   LDLDIRECT 111.1 02/20/2013   TRIG 83.0 09/12/2013   CHOLHDL 2 09/12/2013   Lab Results  Component Value Date   HGBA1C 7.1* 05/21/2014   Lab Results  Component Value Date   VITAMINB12 654 05/21/2014   Lab Results  Component Value Date   TSH 0.44 12/03/2011   06/26/14 Mildly abnormal MRI brain (with and without) demonstrating:  1. 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 These findings are non-specific and considerations include autoimmune, inflammatory, post-infectious, microvascular ischemic or migraine associated  etiologies.  2. No abnormal lesions are seen on post contrast views.  06/26/14 Abnormal MRI cervical spine (without) demonstrating:  1. At C5-6: disc bulging and uncovertebral joint hypertrophy with severe biforaminal stenosis.  2. At C6-7: disc bulging and uncovertebral joint hypertrophy with severe biforaminal stenosis.  3. No intrinsic or compressive spinal cord lesions.   ASSESSMENT: 8 year lady with left foot and hand paresthesias of undetermined etiology. Left lateral leg numbness likely from injury to branch of the lateral peroneal nerve and left hand paresthesias from radiculopathy from C5-7.  PLAN: I had a long discussion with the patient regarding her numbness in the left foot and tingling in the left arm discussed MRI results and answered questions.   At the present time she wants to hold off on trying medications like gabapentin since the sensory symptoms are tolerable. She would hold off on any epidural steroid injections as well.  She plans to start Physical Therapy  next week.  I advised her to do neck stretching exercises and not to carry a heavy bag predominantly in the left hand and to alternate hands and to lighten her bag. She was in aggreement of the above plan. Keep regularly scheduled followup in October.  Tawny AsalLYNN E. Quindarrius Joplin, MSN, FNP-BC, A/GNP-C 07/02/2014, 1:42 PM [facility] [address], [address] [place], [place] [address] [phone]  Note: This document was prepared with digital dictation and possible smart phrase technology. Any transcriptional errors that result from this process are unintentional.

## 2014-07-04 NOTE — Progress Notes (Signed)
I agree with the above plan 

## 2014-07-29 ENCOUNTER — Telehealth: Payer: Self-pay | Admitting: *Deleted

## 2014-07-29 NOTE — Telephone Encounter (Signed)
VM left for pt in regards to DB f/u for a BP check  

## 2014-08-07 ENCOUNTER — Other Ambulatory Visit: Payer: Self-pay

## 2014-08-07 DIAGNOSIS — Z1231 Encounter for screening mammogram for malignant neoplasm of breast: Secondary | ICD-10-CM

## 2014-08-15 LAB — CBC AND DIFFERENTIAL: PLATELETS: 583 10*3/uL — AB (ref 150–399)

## 2014-08-15 LAB — POCT ERYTHROCYTE SEDIMENTATION RATE, NON-AUTOMATED: Sed Rate: 53 mm

## 2014-08-23 ENCOUNTER — Ambulatory Visit: Payer: 59

## 2014-09-04 ENCOUNTER — Other Ambulatory Visit: Payer: Self-pay

## 2014-09-04 MED ORDER — METFORMIN HCL 850 MG PO TABS: ORAL_TABLET | ORAL | Status: DC

## 2014-09-09 ENCOUNTER — Encounter: Payer: Self-pay | Admitting: Neurology

## 2014-09-09 ENCOUNTER — Ambulatory Visit (INDEPENDENT_AMBULATORY_CARE_PROVIDER_SITE_OTHER): Payer: Medicare Other | Admitting: Neurology

## 2014-09-09 VITALS — BP 116/70 | HR 85 | Ht 62.0 in | Wt 138.0 lb

## 2014-09-09 DIAGNOSIS — M545 Low back pain, unspecified: Secondary | ICD-10-CM | POA: Insufficient documentation

## 2014-09-09 MED ORDER — CYCLOBENZAPRINE HCL 5 MG PO TABS: 5.0000 mg | ORAL_TABLET | Freq: Three times a day (TID) | ORAL | Status: DC | PRN

## 2014-09-09 NOTE — Progress Notes (Signed)
PATIENT: [name] SearingBetty M Lye DOB: 06/05/1946  REASON FOR VISIT: routine follow up for numbness and tingling in right arm HISTORY FROM: patient  HISTORY OF PRESENT ILLNESS: [age] year African American lady who noticed numbness involving the lateral aspect of the left leg beneath the knee following a local steroid injection 2 months ago for knee pain. This is constant and has not changed. Occasionally she gets tingling and some pain. She had nerve conduction EMG done in lower limbs only  on 03/15/14 by Dr. Naaman PlummerFred Newton which I personally reviewed showed no evidence of peripheral neuropathy or radiculopathy. She is also complaining of intermittent tingling and numbness in the left hand which occurs off and on. There is no specific trigger. Denies any weakness in the hand or significant neck pain or rectal pain. She's had no falls or injuries recently. She says she recently had x-rays of her neck done which showed some arthritic changes but I did not have the films or report to review today. She has had no prior history of stroke, TIA, seizures, head injury were significant neurological problems. She denies significant weakness of her feet and or gait or balance problems. Lab work on 05/21/14 showed normal WBC and basic metabolic panel.  Update 07/02/14 (LL): Patient comes for revisit after MRI scans completed. 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  She states her symptoms are not severe enough to have any steroid injections and she has PT ordered to start soon.  She denies pain and the numbness and tingling is unchanged. UPDATE 09/09/2014 : She returns for followup of the loss of the 2 months ago. She had significant back pain and spasms about to 3 weeks ago and could barely walk. She saw her rheumatologist Dr. Pollyann SavoyShaili Deveshwar and had x-rays of the back and knee done. She was found to have  significant fluid in the right knee which was tapped and she felt better after that. She was advised to do back exercises and parts within physical therapy at the last visit but has not done that. She continues to have numbness below the left knee which is unchanged and she states she has gotten used to it. She is currently taking muscle relaxant methocarbamol 503 times daily without any benefit. She denies any true sciatica, lack of bladder or bowel control. ROS:   14 system review of systems is positive for numbness, and tingling, snoring and all other systems negative  ALLERGIES: Allergies  Allergen Reactions  . Celecoxib     REACTION: RASH    HOME MEDICATIONS: Outpatient Prescriptions Prior to Visit  Medication Sig Dispense Refill  . ALPRAZolam (XANAX) 0.5 MG tablet Take 0.5-1 tablets (0.25-0.5 mg total) by mouth at bedtime as needed for sleep.  30 tablet  1  . aspirin 81 MG tablet Take 81 mg by mouth daily.        . Cholecalciferol (VITAMIN D) 2000 UNITS CAPS Take 2,000 Units by mouth daily.       Marland Kitchen. diltiazem (CARDIZEM CD) 300 MG 24 hr capsule TAKE ONE CAPSULE BY MOUTH DAILY  30 capsule  6  . FREESTYLE TEST STRIPS test strip USE AS DIRECTED 1 TO 2 TIMES DAILY  50 each  11  . Loratadine (CLARITIN PO) Take 10 mg by mouth daily.       . metFORMIN (GLUCOPHAGE) 850 MG tablet Take 1.5 tablets twice daily  180  tablet  1  . metoCLOPramide (REGLAN) 5 MG tablet Take 1 tablet (5 mg total) by mouth 3 (three) times daily.  90 tablet  3  . pravastatin (PRAVACHOL) 20 MG tablet TAKE 1 TABLET (20 MG TOTAL) BY MOUTH AT BEDTIME.  30 tablet  6  . zoster vaccine live, PF, (ZOSTAVAX) [id number] UNT/0.65ML injection Inject 19,400 Units into the skin once.  1 each  0   No facility-administered medications prior to visit.    PHYSICAL EXAM Filed Vitals:   09/09/14 1000  BP: 116/70  Pulse: 85  Height: 5\' 2"  (1.575 m)  Weight: 138 lb (62.596 kg)   Body mass index is 25.23 kg/(m^2).  Physical Exam General:   elderly African American lady seated, in no evident distress Cardiovascular: regular rate and rhythm, no murmurs Musculoskeletal: no deformity. Left shoulder elevation limited due to pain. Osteoarthritis deformities at finger tips. Mild back paraspinal spasm.  Neurologic Exam Mental Status: Awake and fully alert. Oriented to place and time. Recent and remote memory intact. Attention span, concentration and fund of knowledge appropriate. Mood and affect appropriate.  Cranial Nerves: Pupils equal, briskly reactive to light. Extraocular movements full without nystagmus. Visual fields full to confrontation. Hearing intact. Facial sensation intact. Face, tongue, palate moves normally and symmetrically.  Motor: Normal bulk and tone. Normal strength in all tested extremity muscles. Sensory: intact to touch and pinprick and vibratory sensation. Tinel's sign negative over both wrist and left tibial groove. Coordination: Rapid alternating movements normal in all extremities. Finger-to-nose and heel-to-shin performed accurately bilaterally. Gait and Station: Arises from chair without difficulty. Stance is normal. Gait demonstrates favoring of back due to pain . Unable to heel, toe and tandem walk without difficulty.  Reflexes: 1+ and symmetric. Toes downgoing.    DIAGNOSTIC DATA (LABS, IMAGING, TESTING) - I reviewed patient records, labs, notes, testing and imaging myself where available.  Lab Results  Component Value Date   WBC 4.0 05/21/2014   HGB 12.6 05/21/2014   HCT 38.1 05/21/2014   MCV 90.6 05/21/2014   PLT 280.0 05/21/2014      Component Value Date/Time   NA 139 05/21/2014 1024   K 3.6 05/21/2014 1024   CL 104 05/21/2014 1024   CO2 25 05/21/2014 1024   GLUCOSE 135* 05/21/2014 1024   GLUCOSE 129 07/28/2010   BUN 11 05/21/2014 1024   CREATININE 0.6 05/21/2014 1024   CALCIUM 9.6 05/21/2014 1024   PROT 7.3 05/21/2014 1024   ALBUMIN 4.1 05/21/2014 1024   AST 19 05/21/2014 1024   ALT 18 05/21/2014 1024   ALKPHOS 66  05/21/2014 1024   BILITOT 0.4 05/21/2014 1024   GFRNONAA 127.47 04/14/2010 0850   GFRAA 131 09/13/2008 1048   Lab Results  Component Value Date   CHOL 184 09/12/2013   HDL 93.70 09/12/2013   LDLCALC 74 09/12/2013   LDLDIRECT 111.1 02/20/2013   TRIG 83.0 09/12/2013   CHOLHDL 2 09/12/2013   Lab Results  Component Value Date   HGBA1C 7.1* 05/21/2014   Lab Results  Component Value Date   VITAMINB12 654 05/21/2014   Lab Results  Component Value Date   TSH 0.44 12/03/2011   06/26/14 Mildly abnormal MRI brain (with and without) demonstrating:  1. 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 These findings are non-specific and considerations include autoimmune, inflammatory, post-infectious, microvascular ischemic or migraine associated etiologies.  2. No abnormal lesions are seen on post contrast views.  06/26/14 Abnormal MRI cervical spine (without) demonstrating:  1.  At C5-6: disc bulging and uncovertebral joint hypertrophy with severe biforaminal stenosis.  2. At C6-7: disc bulging and uncovertebral joint hypertrophy with severe biforaminal stenosis.  3. No intrinsic or compressive spinal cord lesions.   ASSESSMENT: 31 year lady with left foot and hand paresthesias of undetermined etiology. Left lateral leg numbness likely from injury to branch of the lateral peroneal nerve and left hand paresthesias from radiculopathy from C5-7. Chronic low back pain likely musculoskeletal from degenerative arthritis  PLAN: I had a long discussion with the patient with regards to her low back pain, muscle spasm as well as left foot and hand numbness. I recommend she do regular back stretching exercises as well as to participate in physical therapy for gait training. Trial of Flexeril 5 mg 3 times daily to help with muscle spasm. She feels her numbness and tingling are manageable at the present time and she does not want specific medication for that dear we will hold off on  doing MRI of the lumbar spine at the present time as patient is reluctant to consider epidural steroid injections or surgery if necessary. Return for followup in 3 months with Heide Guile, NP or call earlier if necessary Delia Heady, MD  09/09/2014, 11:29 AM [facility] [address] [place], [place] [address] [phone]  Note: This document was prepared with digital dictation and possible smart phrase technology. Any transcriptional errors that result from this process are unintentional.

## 2014-09-09 NOTE — Patient Instructions (Signed)
I had a long discussion with the patient with regards to her low back pain, muscle spasm as well as left foot and hand numbness. I recommend she do regular back stretching exercises as well as to participate in physical therapy for gait training. Trial of Flexeril 5 mg 3 times daily to help with muscle spasm. She feels her numbness and tingling are manageable at the present time and she does not want specific medication for that dear we will hold off on doing MRI of the lumbar spine at the present time as patient is reluctant to consider epidural steroid injections or surgery if necessary. Return for followup in 3 months with Heide GuileLynn Lam, NP or call earlier if necessary. Back Pain, Adult Low back pain is very common. About 1 in 5 people have back pain.The cause of low back pain is rarely dangerous. The pain often gets better over time.About half of people with a sudden onset of back pain feel better in just 2 weeks. About 8 in 10 people feel better by 6 weeks.  CAUSES Some common causes of back pain include:  Strain of the muscles or ligaments supporting the spine.  Wear and tear (degeneration) of the spinal discs.  Arthritis.  Direct injury to the back. DIAGNOSIS Most of the time, the direct cause of low back pain is not known.However, back pain can be treated effectively even when the exact cause of the pain is unknown.Answering your caregiver's questions about your overall health and symptoms is one of the most accurate ways to make sure the cause of your pain is not dangerous. If your caregiver needs more information, he or she may order lab work or imaging tests (X-rays or MRIs).However, even if imaging tests show changes in your back, this usually does not require surgery. HOME CARE INSTRUCTIONS For many people, back pain returns.Since low back pain is rarely dangerous, it is often a condition that people can learn to [facility] their own.   Remain active. It is stressful on the back to sit  or stand in one place. Do not sit, drive, or stand in one place for more than 30 minutes at a time. Take short walks on level surfaces as soon as pain allows.Try to increase the length of time you walk each day.  Do not stay in bed.Resting more than 1 or 2 days can delay your recovery.  Do not avoid exercise or work.Your body is made to move.It is not dangerous to be active, even though your back may hurt.Your back will likely heal faster if you return to being active before your pain is gone.  Pay attention to your body when you bend and lift. Many people have less discomfortwhen lifting if they bend their knees, keep the load close to their bodies,and avoid twisting. Often, the most comfortable positions are those that put less stress on your recovering back.  Find a comfortable position to sleep. Use a firm mattress and lie on your side with your knees slightly bent. If you lie on your back, put a pillow under your knees.  Only take over-the-counter or prescription medicines as directed by your caregiver. Over-the-counter medicines to reduce pain and inflammation are often the most helpful.Your caregiver may prescribe muscle relaxant drugs.These medicines help dull your pain so you can more quickly return to your normal activities and healthy exercise.  Put ice on the injured area.  Put ice in a plastic bag.  Place a towel between your skin and the bag.  Leave the ice on for 15-20 minutes, 03-04 times a day for the first 2 to 3 days. After that, ice and heat may be alternated to reduce pain and spasms.  Ask your caregiver about trying back exercises and gentle massage. This may be of some benefit.  Avoid feeling anxious or stressed.Stress increases muscle tension and can worsen back pain.It is important to recognize when you are anxious or stressed and learn ways to manage it.Exercise is a great option. SEEK MEDICAL CARE IF:  You have pain that is not relieved with rest or  medicine.  You have pain that does not improve in 1 week.  You have new symptoms.  You are generally not feeling well. SEEK IMMEDIATE MEDICAL CARE IF:   You have pain that radiates from your back into your legs.  You develop new bowel or bladder control problems.  You have unusual weakness or numbness in your arms or legs.  You develop nausea or vomiting.  You develop abdominal pain.  You feel faint. Document Released: 11/01/2005 Document Revised: 05/02/2012 Document Reviewed: 03/05/2014 [facility] Patient Information 2015 [place] [place]. This information is not intended to replace advice given to you by your health care provider. Make sure you discuss any questions you have with your health care provider.

## 2014-09-24 ENCOUNTER — Encounter: Payer: Self-pay | Admitting: Internal Medicine

## 2014-09-24 ENCOUNTER — Ambulatory Visit (INDEPENDENT_AMBULATORY_CARE_PROVIDER_SITE_OTHER): Payer: 59 | Admitting: Internal Medicine

## 2014-09-24 ENCOUNTER — Ambulatory Visit (INDEPENDENT_AMBULATORY_CARE_PROVIDER_SITE_OTHER): Payer: 59

## 2014-09-24 VITALS — BP 122/64 | HR 85 | Temp 98.0°F | Wt 139.0 lb

## 2014-09-24 DIAGNOSIS — M545 Low back pain: Secondary | ICD-10-CM

## 2014-09-24 DIAGNOSIS — Z23 Encounter for immunization: Secondary | ICD-10-CM

## 2014-09-24 DIAGNOSIS — E119 Type 2 diabetes mellitus without complications: Secondary | ICD-10-CM

## 2014-09-24 DIAGNOSIS — G5782 Other specified mononeuropathies of left lower limb: Secondary | ICD-10-CM

## 2014-09-24 DIAGNOSIS — E785 Hyperlipidemia, unspecified: Secondary | ICD-10-CM

## 2014-09-24 LAB — BASIC METABOLIC PANEL
BUN: 6 mg/dL (ref 6–23)
CALCIUM: 9.2 mg/dL (ref 8.4–10.5)
CO2: 28 mEq/L (ref 19–32)
Chloride: 105 mEq/L (ref 96–112)
Creatinine, Ser: 0.6 mg/dL (ref 0.4–1.2)
GFR: 138.75 mL/min (ref >60.00)
GLUCOSE: 130 mg/dL — AB (ref 70–99)
Potassium: 3.3 mEq/L — ABNORMAL LOW (ref 3.5–5.1)
SODIUM: 140 meq/L (ref 135–145)

## 2014-09-24 LAB — LIPID PANEL
CHOLESTEROL: 160 mg/dL (ref 0–200)
HDL: 65.8 mg/dL (ref >39.00)
LDL Cholesterol: 81 mg/dL (ref 0–99)
NonHDL: 94.2
Total CHOL/HDL Ratio: 2
Triglycerides: 66 mg/dL (ref 0.0–149.0)
VLDL: 13.2 mg/dL (ref 0.0–40.0)

## 2014-09-24 LAB — HEMOGLOBIN A1C: HEMOGLOBIN A1C: 7.1 % — AB (ref 4.6–6.5)

## 2014-09-24 MED ORDER — GABAPENTIN 100 MG PO CAPS: 100.0000 mg | ORAL_CAPSULE | Freq: Three times a day (TID) | ORAL | Status: DC

## 2014-09-24 NOTE — Patient Instructions (Signed)
Get your blood work before you leave   For pain: Stop Flexeril Take tylenol as needed May also take IBUPROFEN (Advil or Motrin) 200 mg 1 tablets every 6 hours as needed for pain.  Always take it with food because may cause gastritis and ulcers.  If you notice nausea, stomach pain, change in the color of stools --->  Stop the medicine and let us know Keep hydrocodone for pain at night Start gabapentin: 1 tablet at bedtime for one week, then one tablet twice a day for 1 week, then 1 tablet 3 times a day  Please come back to the office in 3 months for a routine check up

## 2014-09-24 NOTE — Progress Notes (Signed)
Pre visit review using our clinic review tool, if applicable. No additional management support is needed unless otherwise documented below in the visit note. 

## 2014-09-24 NOTE — Assessment & Plan Note (Signed)
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 Status post MRI Back pain, felt to be related to DJD per neurology note. No MRI so far. Flexeril and hydrocodone not helping. Ibuprofen does help. Plan: Recommend to continue working with rheumatology and neurology Discontinue Flexeril Okay to take ibuprofen but with GI precautions. Check a BMP today Keep hydrocodone for nighttime only Trial with gabapentin

## 2014-09-24 NOTE — Progress Notes (Signed)
Subjective:    Patient ID: [name] [name], female    DOB: 08/01/1946, 68 y.o.   MRN: [id number]  DOS:  09/24/2014 Type of visit - description : rov Interval history: Continue with back pain, mild neck pain and paresthesias of the upper and lower extremities. Notes from rheumatology, neurology reviewed. See assessment and plan Diabetes, taking less metformin than prescribed; because the back pain she feels frustrated and that is decreasing her appetite thus eating less. High cholesterol, good medication compliance.   ROS Denies chest pain or difficulty breathing No nausea, vomiting, diarrhea or blood in the stools. No change in the color of the stools  Past Medical History  Diagnosis Date  . Diabetes mellitus     w/ neuropathy  . Hyperlipidemia   . Hypertension   . Osteoarthritis     gout, CPPD--- sees Dr.Davenshwar  . Osteopenia     per DEXA 4/10  . Recurrent boils     h/o MRSA  . Chest pain 06/2007    CP and QW on EKG: neg stress test   . Diverticulitis 2000  . Insomnia 02/20/2013    Past Surgical History  Procedure Laterality Date  . Cholecystectomy    . Tonsillectomy      History   Social History  . Marital Status: Married    Spouse Name: herbert    Number of Children: 3  . Years of Education: college   Occupational History  . 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 Smoking status: Never Smoker   . Smokeless tobacco: Never Used  . Alcohol Use: No  . Drug Use: No  . Sexual Activity: Not on file   Other Topics Concern  . Not on file   Social History Narrative   Patient is married with 3 children.   Patient is right handed.   Patient has college education.   Patient drinks 3-4 daily.        Medication List       This list is accurate as of: 09/24/14  6:06 PM.  Always use your most recent med list.               ALPRAZolam 0.5 MG  tablet  Commonly known as:  XANAX  Take 0.5-1 tablets (0.25-0.5 mg total) by mouth at bedtime as needed for sleep.     aspirin 81 MG tablet  Take 81 mg by mouth daily.     CLARITIN PO  Take 10 mg by mouth daily.     diltiazem 300 MG 24 hr capsule  Commonly known as:  CARDIZEM CD  TAKE ONE CAPSULE BY MOUTH DAILY     FREESTYLE TEST STRIPS test strip  Generic drug:  glucose blood  USE AS DIRECTED 1 TO 2 TIMES DAILY     gabapentin 100 MG capsule  Commonly known as:  NEURONTIN  Take 1 capsule (100 mg total) by mouth 3 (three) times daily.     HYDROcodone-acetaminophen 7.5-325 MG per tablet  Commonly known as:  NORCO  Take 1 tablet by mouth.     metFORMIN 850 MG tablet  Commonly known as:  GLUCOPHAGE  Take 850 mg by mouth 2 (two) times daily with a meal.     methocarbamol 500 MG tablet  Commonly known as:  ROBAXIN  Take 500 mg by mouth 4 (four) times daily.  metoCLOPramide 5 MG tablet  Commonly known as:  REGLAN  Take 1 tablet (5 mg total) by mouth 3 (three) times daily.     pravastatin 20 MG tablet  Commonly known as:  PRAVACHOL  TAKE 1 TABLET (20 MG TOTAL) BY MOUTH AT BEDTIME.     Vitamin D 2000 UNITS Caps  Take 2,000 Units by mouth daily.           Objective:   Physical Exam BP 122/64 mmHg  Pulse 85  Temp(Src) 98 F (36.7 C) (Oral)  Wt 139 lb (63.05 kg)  SpO2 98%  General -- alert, well-developed, NAD.   Lungs -- normal respiratory effort, no intercostal retractions, no accessory muscle use, and normal breath sounds.  Heart-- normal rate, regular rhythm, no murmur.  Extremities-- no pretibial edema bilaterally  Neurologic--  alert & oriented X3. Speech normal, gait appropriate for age, strength symmetric and appropriate for age.   Psych-- Cognition and judgment appear intact. Cooperative with normal attention span and concentration. No anxious or depressed appearing.     Assessment & Plan:

## 2014-09-24 NOTE — Assessment & Plan Note (Signed)
Based on  last A1c, was recommended to increase metformin to 850 mg 1.5 tablets twice a day, she continue taking 1 tablet twice a day. At the same time her appetite has decreased due to chronic back pain. Plan: Continue 1 tablet twice a day, check A1c

## 2014-09-24 NOTE — Assessment & Plan Note (Addendum)
Per neurology note, paresthesia at the lateral side of the left leg felt to be related to the peroneal nerve. Symptoms started after a local injection to the left knee according to the patient

## 2014-09-24 NOTE — Assessment & Plan Note (Signed)
Good compliance of medication, due for a FLP

## 2014-09-25 ENCOUNTER — Ambulatory Visit
Admission: RE | Admit: 2014-09-25 | Discharge: 2014-09-25 | Disposition: A | Discharge status: Home / Self Care | Payer: 59 | Source: Ambulatory Visit

## 2014-09-25 DIAGNOSIS — Z1231 Encounter for screening mammogram for malignant neoplasm of breast: Secondary | ICD-10-CM

## 2014-09-26 ENCOUNTER — Telehealth: Payer: Self-pay | Admitting: *Deleted

## 2014-09-26 MED ORDER — METFORMIN HCL 850 MG PO TABS: ORAL_TABLET | ORAL | Status: DC

## 2014-09-26 NOTE — Telephone Encounter (Signed)
Spoke with Pt, please see lab result notes.  

## 2014-09-26 NOTE — Addendum Note (Signed)
Addended by: Dorette GrateFAULKNER, Vickee Mormino C on: 09/26/2014 03:40 PM   Modules accepted: Orders

## 2014-09-26 NOTE — Telephone Encounter (Signed)
Pt is returning your call please call back

## 2014-10-14 ENCOUNTER — Telehealth: Payer: Self-pay | Admitting: Internal Medicine

## 2014-10-14 MED ORDER — ONETOUCH ULTRASOFT LANCETS MISC: Status: DC

## 2014-10-14 MED ORDER — ONETOUCH ULTRA SYSTEM W/DEVICE KIT: PACK | Status: DC

## 2014-10-14 MED ORDER — GLUCOSE BLOOD VI STRP: ORAL_STRIP | Status: DC

## 2014-10-14 NOTE — Telephone Encounter (Signed)
LMOM for Pt to return call. I need to know which Free Style Glucose meter device she uses that way she gets the correct strips for device.

## 2014-10-14 NOTE — Telephone Encounter (Signed)
Spoke with Pt, she currently uses a FreeStyle device which her insurance will no longer cover strips for. Informed her I can send in rx for One Touch device, strips, and lancets. In[facility] to [facility]. Pt verbalized understanding. New rx for device, strips, and lancets sent to [facility].

## 2014-10-14 NOTE — Telephone Encounter (Signed)
[employer] sent her a letter, stating they will no longer pay for her test stripes. Pt would like to try the free styles. Please advise.

## 2014-11-06 ENCOUNTER — Telehealth: Payer: Self-pay | Admitting: Internal Medicine

## 2014-11-06 ENCOUNTER — Other Ambulatory Visit: Payer: Self-pay

## 2014-11-06 ENCOUNTER — Other Ambulatory Visit: Payer: Self-pay | Admitting: Internal Medicine

## 2014-11-06 MED ORDER — GLUCOSE BLOOD VI STRP: ORAL_STRIP | Status: DC

## 2014-11-06 NOTE — Telephone Encounter (Signed)
Lancets and test strips were both refilled on 10/14/2014, but will send new rx for test strips.

## 2014-11-06 NOTE — Telephone Encounter (Signed)
Caller name:jack Relation to GN:FAOZpt:none Call back number:[phone] [facility]  Reason for call: pharmacy is needing new rx for one touch ultra test strips, states the one they sent was denied because they stated pt had already received a year for the rx , pharmacy states they never received that rx for test strips but received it for a year for the lancets

## 2014-12-16 ENCOUNTER — Ambulatory Visit: Payer: Medicare Other | Admitting: Adult Health

## 2014-12-18 ENCOUNTER — Encounter: Payer: Self-pay | Admitting: Adult Health

## 2014-12-26 ENCOUNTER — Other Ambulatory Visit: Payer: Self-pay | Admitting: Internal Medicine

## 2014-12-31 ENCOUNTER — Ambulatory Visit: Payer: 59 | Admitting: Internal Medicine

## 2015-01-01 ENCOUNTER — Encounter: Payer: Self-pay | Admitting: Internal Medicine

## 2015-01-01 ENCOUNTER — Ambulatory Visit (INDEPENDENT_AMBULATORY_CARE_PROVIDER_SITE_OTHER): Payer: 59 | Admitting: Internal Medicine

## 2015-01-01 ENCOUNTER — Other Ambulatory Visit: Payer: Self-pay | Admitting: Internal Medicine

## 2015-01-01 VITALS — BP 122/79 | HR 86 | Temp 98.3°F | Ht 63.0 in | Wt 135.4 lb

## 2015-01-01 DIAGNOSIS — M545 Low back pain: Secondary | ICD-10-CM

## 2015-01-01 DIAGNOSIS — E119 Type 2 diabetes mellitus without complications: Secondary | ICD-10-CM

## 2015-01-01 LAB — HEMOGLOBIN A1C: Hgb A1c MFr Bld: 7.2 % — ABNORMAL HIGH (ref 4.6–6.5)

## 2015-01-01 MED ORDER — ONETOUCH ULTRASOFT LANCETS MISC: Status: DC

## 2015-01-01 NOTE — Patient Instructions (Signed)
Get your blood work before you leave    Please come back to the office in 4 months  for a routine check up   

## 2015-01-01 NOTE — Assessment & Plan Note (Signed)
Since the last time she was here, she so rheumatology, MRI was done. Currently doing actually very good, not taking gabapentin, taking Motrin rarely

## 2015-01-01 NOTE — Progress Notes (Signed)
Subjective:    Patient ID: [name] [name], female    DOB: 07-20-1946, 69 y.o.   MRN: [id number]  DOS:  01/01/2015 Type of visit - description : f/u Interval history: Likes her ears check, thinks she has wax, mild discomfort DJD, saw rheumatology, MRIs were done. Currently doing very well, not taking gabapentin or Robaxin. Diabetes, self decrease metformin to 500 mg daily, see below. Was having abdominal pain, ill-defined, sx increased with food;  She self- decrease metformin dose and pain is almost   gone. She does have Motrin but has not been taking it consistently in fact, takes it  sporadically   Review of Systems Denies nausea, vomiting, diarrhea or blood in the stools. No chest pain, difficulty breathing or lower extremity edema  Past Medical History  Diagnosis Date  . Diabetes mellitus     w/ neuropathy  . Hyperlipidemia   . Hypertension   . Osteoarthritis     gout, CPPD--- sees Dr.Davenshwar  . Osteopenia     per DEXA 4/10  . Recurrent boils     h/o MRSA  . Chest pain 06/2007    CP and QW on EKG: neg stress test   . Diverticulitis 2000  . Insomnia 02/20/2013    Past Surgical History  Procedure Laterality Date  . Cholecystectomy    . Tonsillectomy      History   Social History  . Marital Status: Married    Spouse Name: herbert  . Number of Children: 3  . Years of Education: college   Occupational History  . 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 Smoking status: Never Smoker   . Smokeless tobacco: Never Used  . Alcohol Use: No  . Drug Use: No  . Sexual Activity: Not on file   Other Topics Concern  . Not on file   Social History Narrative   Patient is married with 3 children.   Patient is right handed.   Patient has college education.   Patient drinks 3-4 daily.        Medication List       This list is accurate as of: 01/01/15  5:33  PM.  Always use your most recent med list.               ALPRAZolam 0.5 MG tablet  Commonly known as:  XANAX  Take 0.5-1 tablets (0.25-0.5 mg total) by mouth at bedtime as needed for sleep.     aspirin 81 MG tablet  Take 81 mg by mouth daily.     CLARITIN PO  Take 10 mg by mouth daily.     diltiazem 300 MG 24 hr capsule  Commonly known as:  CARDIZEM CD  TAKE ONE CAPSULE BY MOUTH DAILY     glucose blood test strip  Check your blood sugar 1-2 times daily.     HYDROcodone-acetaminophen 7.5-325 MG per tablet  Commonly known as:  NORCO  Take 1 tablet by mouth.     metFORMIN 500 MG tablet  Commonly known as:  GLUCOPHAGE  Take 500 mg by mouth daily with breakfast.     metoCLOPramide 5 MG tablet  Commonly known as:  REGLAN  Take 1 tablet (5 mg total) by mouth 3 (three) times daily.     ONE TOUCH ULTRA SYSTEM KIT W/DEVICE Kit  Check your blood sugar 1-2  times daily.     onetouch ultrasoft lancets  Check your blood sugar 1-2 times daily.     pravastatin 20 MG tablet  Commonly known as:  PRAVACHOL  TAKE 1 TABLET (20 MG TOTAL) BY MOUTH AT BEDTIME.     Vitamin D 2000 UNITS Caps  Take 2,000 Units by mouth daily.           Objective:   Physical Exam BP 122/79 mmHg  Pulse 86  Temp(Src) 98.3 F (36.8 C) (Oral)  Ht 5' 3"  (1.6 m)  Wt 135 lb 6 oz (61.406 kg)  BMI 23.99 kg/m2  SpO2 96% General:   Well developed, well nourished . NAD.  HEENT:  Normocephalic . Face symmetric, atraumatic Ears with wax bilaterally, worse on the right Lungs:  CTA B Normal respiratory effort, no intercostal retractions, no accessory muscle use. Heart: RRR,  no murmur.  Abdomen:  Not distended, soft, non-tender. No rebound or rigidity. No mass,organomegaly Muscle skeletal: no pretibial edema bilaterally  Skin: Not pale. Not jaundice Neurologic:  alert & oriented X3.  Speech normal, gait appropriate for age and unassisted Psych--  Cognition and judgment appear intact.  Cooperative  with normal attention span and concentration.  Behavior appropriate. No anxious or depressed appearing.      Assessment & Plan:    Cerumen impaction, status post lavage (R) , canal looks better. Recommend peroxide on the left ear and come back if needed for a lavage

## 2015-01-01 NOTE — Assessment & Plan Note (Addendum)
due to a ill-defined stomach discomfort, decrease metformin 850 mg 1.5 tablet twice a day to metformin 500 mg daily, taking the reduced dose for the last 2 months. Plan: Check A1c, further advice for results. Had an eye exam with Dr. Nile RiggsShapiro less than a year ago

## 2015-01-01 NOTE — Progress Notes (Signed)
Pre visit review using our clinic review tool, if applicable. No additional management support is needed unless otherwise documented below in the visit note. 

## 2015-01-02 ENCOUNTER — Telehealth: Payer: Self-pay | Admitting: Internal Medicine

## 2015-01-02 ENCOUNTER — Other Ambulatory Visit: Payer: Self-pay

## 2015-01-02 MED ORDER — GLUCOSE BLOOD VI STRP: ORAL_STRIP | Status: DC

## 2015-01-02 MED ORDER — SAXAGLIPTIN HCL 5 MG PO TABS: 5.0000 mg | ORAL_TABLET | Freq: Every day | ORAL | Status: DC

## 2015-01-02 MED ORDER — METFORMIN HCL 500 MG PO TABS: 500.0000 mg | ORAL_TABLET | Freq: Every day | ORAL | Status: DC

## 2015-01-02 NOTE — Telephone Encounter (Signed)
Pt did not request test strips yesterday at appt, she requested lancets needles. However, test strips and Metformin sent to pharmacy as requested.

## 2015-01-02 NOTE — Addendum Note (Signed)
Addended by: Dorette GrateFAULKNER, Haylynn Pha C on: 01/02/2015 03:58 PM   Modules accepted: Orders

## 2015-01-02 NOTE — Telephone Encounter (Signed)
Caller name: Braelee Relation to pt: self Call back number: [phone] Pharmacy:  [facility] [address]  Reason for call:   Patient states that the wrong test strips were called in yesterday. She uses one touch mini. Also, she states that Dr. Drue NovelPaz changed the metformin to 500mg . She states that this was not called in.

## 2015-01-08 ENCOUNTER — Telehealth: Payer: Self-pay | Admitting: Internal Medicine

## 2015-01-08 ENCOUNTER — Other Ambulatory Visit: Payer: Self-pay | Admitting: Internal Medicine

## 2015-01-08 MED ORDER — PRAVASTATIN SODIUM 20 MG PO TABS: ORAL_TABLET | ORAL | Status: DC

## 2015-01-08 NOTE — Telephone Encounter (Addendum)
Called pt back to clarify the medication that was to expensive is Onglyza

## 2015-01-08 NOTE — Telephone Encounter (Signed)
Caller name: Olene Cravenettiford, Angelic M Relation to pt: self  Call back number: [phone] Pharmacy: [id number] [place], [place] - [address] [phone] (Phone) [phone] (Fax)         Reason for call:  Pt states metFORMIN (GLUCOPHAGE) 500 MG tablet is to expensive and would like a cheaper medication. Pharmacy suggested lovastatin

## 2015-01-08 NOTE — Telephone Encounter (Signed)
Spoke with Pt, informed her that cholesterol med is PRAVASTATIN 20 MG, Pt requested I send medication to [facility] on [address] to see how much medication would be. Informed her that I would. (sent # 30 and 6 refills).

## 2015-01-08 NOTE — Telephone Encounter (Signed)
Please advise if change below is appropriate. JG//CMA

## 2015-01-08 NOTE — Telephone Encounter (Signed)
Please clarify, does she need change metformin or Pravachol? (They are both generic, if they are very expensive at her current pharmacy [facility] to the pharmacy downstairs from this office. The one medication that is truly expensive is Onglyza, she may like to get some assistance with that one.)

## 2015-01-08 NOTE — Telephone Encounter (Signed)
Dr Drue Novelpaz sent in an rx for her cholesterol  She wants to ask another pharmacy how much it would be there and she cant remember the name of the med.  Please call her and advise the name of the med

## 2015-01-09 MED ORDER — GLIMEPIRIDE 2 MG PO TABS: 2.0000 mg | ORAL_TABLET | Freq: Every day | ORAL | Status: DC

## 2015-01-09 NOTE — Telephone Encounter (Signed)
Okay, discontinue Onglyza Start glimepiride 2 mg one tablet daily #30 and 3 refills, watch for low blood sugar symptoms.

## 2015-01-09 NOTE — Telephone Encounter (Signed)
Spoke with Pt, informed her to d/c Onglyza and start Glimepiride 1 tablet daily [facility]), informed her to let us know if still too expensive. Pt verbalized understanding.

## 2015-01-09 NOTE — Telephone Encounter (Signed)
Per www.[facility].com  Januvia is a Tier 2 drug- which is the same Tier as Onglyza which the Pt states is to expensive  Tradjenta and Nesina are a Tier 3-which will be even more expensive than the Onglyza and Januvia   Preferred medication/Tier 1 not shown.

## 2015-01-09 NOTE — Telephone Encounter (Signed)
Please call her insurance, alternatives for onglyza are: Januvia Tradjenta  Nesina See if one of then his color by her plan

## 2015-01-10 NOTE — Telephone Encounter (Signed)
Spoke with Pt, informed her that she does need her Glimepiride filled as it is to replace Onglyza that she called and stated was to expensive. Informed her that I would call [facility] and informed them to fill medication so she may pick it up. LMOM for [facility] to fill Glimepiride if placed back.

## 2015-01-10 NOTE — Telephone Encounter (Signed)
She went to the drug store to get the glimperide and she thought this was supposed to be for cholesterol.  When she told the pahrmacist that this was to be for cholesterol  He said glimperide was not a medication for cholesterol so she did not pick up the rx.  After reading Dr Leta JunglingPaz's note glimperide is what he wants her to use to replace on Onglyza but she will need this called in to the pharmacy again since she did not pick it up.  [facility] [address]  Please advise the patient if she should go get this med

## 2015-02-10 ENCOUNTER — Telehealth: Payer: Self-pay | Admitting: Internal Medicine

## 2015-02-10 NOTE — Telephone Encounter (Signed)
Please advise 

## 2015-02-10 NOTE — Telephone Encounter (Signed)
Caller name: Xandrea Relation to pt: self Call back number: [phone] [facility] [address]  Reason for call:   Patient states that she kept itching when she was put on glimiperide so she stopped taking this medication and would like to know what else she should take

## 2015-02-11 MED ORDER — METFORMIN HCL 500 MG PO TABS: ORAL_TABLET | ORAL | Status: DC

## 2015-02-11 NOTE — Telephone Encounter (Signed)
Unable to take DPP-4 inhibitors due to cost,  now apparently intolerant to glimepiride Plan: Discontinue glyburide Gradually increase metformin dose : Take 1 tablet twice a day, then in 2 weeks take 1 tablet with each meal. If unable to tolerate metformin, will consider insulin

## 2015-02-11 NOTE — Telephone Encounter (Signed)
Spoke with Pt, informed her to d/c glimepiride. Increase Metformin to 1 tablet twice daily for 2 weeks, then in 2 weeks take 1 tablet three times daily with a meal. Pt verbalized understanding.

## 2015-02-24 ENCOUNTER — Telehealth: Payer: Self-pay | Admitting: *Deleted

## 2015-02-24 NOTE — Telephone Encounter (Signed)
Pre visit completed w/ pt. Under specialty notes.  

## 2015-02-25 ENCOUNTER — Encounter: Payer: Medicare Other | Admitting: Internal Medicine

## 2015-02-25 ENCOUNTER — Other Ambulatory Visit: Payer: Self-pay

## 2015-02-26 ENCOUNTER — Ambulatory Visit (INDEPENDENT_AMBULATORY_CARE_PROVIDER_SITE_OTHER): Payer: Medicare Other | Admitting: Internal Medicine

## 2015-02-26 ENCOUNTER — Encounter: Payer: Self-pay | Admitting: Internal Medicine

## 2015-02-26 VITALS — BP 122/62 | HR 81 | Temp 98.1°F | Ht 63.0 in | Wt 136.5 lb

## 2015-02-26 DIAGNOSIS — I10 Essential (primary) hypertension: Secondary | ICD-10-CM | POA: Diagnosis not present

## 2015-02-26 DIAGNOSIS — M150 Primary generalized (osteo)arthritis: Secondary | ICD-10-CM | POA: Diagnosis not present

## 2015-02-26 DIAGNOSIS — M159 Polyosteoarthritis, unspecified: Secondary | ICD-10-CM

## 2015-02-26 DIAGNOSIS — K3184 Gastroparesis: Secondary | ICD-10-CM

## 2015-02-26 DIAGNOSIS — E119 Type 2 diabetes mellitus without complications: Secondary | ICD-10-CM

## 2015-02-26 DIAGNOSIS — G4700 Insomnia, unspecified: Secondary | ICD-10-CM

## 2015-02-26 DIAGNOSIS — Z0000 Encounter for general adult medical examination without abnormal findings: Secondary | ICD-10-CM | POA: Diagnosis not present

## 2015-02-26 LAB — BASIC METABOLIC PANEL
BUN: 10 mg/dL (ref 6–23)
CALCIUM: 9.8 mg/dL (ref 8.4–10.5)
CO2: 27 meq/L (ref 19–32)
Chloride: 104 mEq/L (ref 96–112)
Creatinine, Ser: 0.71 mg/dL (ref 0.40–1.20)
GFR: 105.38 mL/min (ref >60.00)
GLUCOSE: 188 mg/dL — AB (ref 70–99)
Potassium: 3.6 mEq/L (ref 3.5–5.1)
SODIUM: 139 meq/L (ref 135–145)

## 2015-02-26 LAB — CBC WITH DIFFERENTIAL/PLATELET
BASOS ABS: 0.1 10*3/uL (ref 0.0–0.1)
BASOS PCT: 1.5 % (ref 0.0–3.0)
Eosinophils Absolute: 0.2 10*3/uL (ref 0.0–0.7)
Eosinophils Relative: 3.8 % (ref 0.0–5.0)
HEMATOCRIT: 37.7 % (ref 36.0–46.0)
Hemoglobin: 12.7 g/dL (ref 12.0–15.0)
LYMPHS ABS: 1.7 10*3/uL (ref 0.7–4.0)
Lymphocytes Relative: 37.8 % (ref 12.0–46.0)
MCHC: 33.7 g/dL (ref 30.0–36.0)
MCV: 86.4 fl (ref 78.0–100.0)
MONOS PCT: 6.3 % (ref 3.0–12.0)
Monocytes Absolute: 0.3 10*3/uL (ref 0.1–1.0)
NEUTROS ABS: 2.2 10*3/uL (ref 1.4–7.7)
Neutrophils Relative %: 50.6 % (ref 43.0–77.0)
Platelets: 269 10*3/uL (ref 150.0–400.0)
RBC: 4.36 Mil/uL (ref 3.87–5.11)
RDW: 15.4 % (ref 11.5–15.5)
WBC: 4.4 10*3/uL (ref 4.0–10.5)

## 2015-02-26 LAB — HM DIABETES FOOT EXAM: HM DIABETIC FOOT EXAM: NORMAL

## 2015-02-26 MED ORDER — PIOGLITAZONE HCL 30 MG PO TABS: 30.0000 mg | ORAL_TABLET | Freq: Every day | ORAL | Status: DC

## 2015-02-26 NOTE — Assessment & Plan Note (Addendum)
Td 2011 pneumonia shot 2008 and 2013 (PNM 23)  PNM 13 --2015 Had a flu shot  shingles immunization ---  Rx provided before , did not get if ($)  Cscope 2006, and  November 2011, next 2016  Female care , saw gyn 2015, s/p PAP Mammogram UTD   Diet-exercise discussed

## 2015-02-26 NOTE — Patient Instructions (Signed)
Get your blood work before you leave    Come back to the office in 4 months   for a routine check up        Fall Prevention and Home Safety Falls cause injuries and can affect all age groups. It is possible to use preventive measures to significantly decrease the likelihood of falls. There are many simple measures which can make your home safer and prevent falls. OUTDOORS  Repair cracks and edges of walkways and driveways.  Remove high doorway thresholds.  Trim shrubbery on the main path into your home.  Have good outside lighting.  Clear walkways of tools, rocks, debris, and clutter.  Check that handrails are not broken and are securely fastened. Both sides of steps should have handrails.  Have leaves, snow, and ice cleared regularly.  Use sand or salt on walkways during winter months.  In the garage, clean up grease or oil spills. BATHROOM  Install night lights.  Install grab bars by the toilet and in the tub and shower.  Use non-skid mats or decals in the tub or shower.  Place a plastic non-slip stool in the shower to sit on, if needed.  Keep floors dry and clean up all water on the floor immediately.  Remove soap buildup in the tub or shower on a regular basis.  Secure bath mats with non-slip, double-sided rug tape.  Remove throw rugs and tripping hazards from the floors. BEDROOMS  Install night lights.  Make sure a bedside light is easy to reach.  Do not use oversized bedding.  Keep a telephone by your bedside.  Have a firm chair with side arms to use for getting dressed.  Remove throw rugs and tripping hazards from the floor. KITCHEN  Keep handles on pots and pans turned toward the center of the stove. Use back burners when possible.  Clean up spills quickly and allow time for drying.  Avoid walking on wet floors.  Avoid hot utensils and knives.  Position shelves so they are not too high or low.  Place commonly used objects within easy  reach.  If necessary, use a sturdy step stool with a grab bar when reaching.  Keep electrical cables out of the way.  Do not use floor polish or wax that makes floors slippery. If you must use wax, use non-skid floor wax.  Remove throw rugs and tripping hazards from the floor. STAIRWAYS  Never leave objects on stairs.  Place handrails on both sides of stairways and use them. Fix any loose handrails. Make sure handrails on both sides of the stairways are as long as the stairs.  Check carpeting to make sure it is firmly attached along stairs. Make repairs to worn or loose carpet promptly.  Avoid placing throw rugs at the top or bottom of stairways, or properly secure the rug with carpet tape to prevent slippage. Get rid of throw rugs, if possible.  Have an electrician put in a light switch at the top and bottom of the stairs. OTHER FALL PREVENTION TIPS  Wear low-heel or rubber-soled shoes that are supportive and fit well. Wear closed toe shoes.  When using a stepladder, make sure it is fully opened and both spreaders are firmly locked. Do not climb a closed stepladder.  Add color or contrast paint or tape to grab bars and handrails in your home. Place contrasting color strips on first and last steps.  Learn and use mobility aids as needed. Install an electrical emergency response system.    Turn on lights to avoid dark areas. Replace light bulbs that burn out immediately. Get light switches that glow.  Arrange furniture to create clear pathways. Keep furniture in the same place.  Firmly attach carpet with non-skid or double-sided tape.  Eliminate uneven floor surfaces.  Select a carpet pattern that does not visually hide the edge of steps.  Be aware of all pets. OTHER HOME SAFETY TIPS  Set the water temperature for 120 F (48.8 C).  Keep emergency numbers on or near the telephone.  Keep smoke detectors on every level of the home and near sleeping areas. Document Released:  10/22/2002 Document Revised: 05/02/2012 Document Reviewed: 01/21/2012 ExitCare Patient Information 2015 [employer]. This information is not intended to replace advice given to you by your health care provider. Make sure you discuss any questions you have with your health care provider.   Preventive Care for Adults Ages 65 and over  Blood pressure check.** / Every 1 to 2 years.  Lipid and cholesterol check.**/ Every 5 years beginning at age 20.  Lung cancer screening. / Every year if you are aged 55-80 years and have a 30-pack-year history of smoking and currently smoke or have quit within the past 15 years. Yearly screening is stopped once you have quit smoking for at least 15 years or develop a health problem that would prevent you from having lung cancer treatment.  Fecal occult blood test (FOBT) of stool. / Every year beginning at age 50 and continuing until age 75. You may not have to do this test if you get a colonoscopy every 10 years.  Flexible sigmoidoscopy** or colonoscopy.** / Every 5 years for a flexible sigmoidoscopy or every 10 years for a colonoscopy beginning at age 50 and continuing until age 75.  Hepatitis C blood test.** / For all people born from 1945 through 1965 and any individual with known risks for hepatitis C.  Abdominal aortic aneurysm (AAA) screening.** / A one-time screening for ages 65 to 75 years who are current or former smokers.  Skin self-exam. / Monthly.  Influenza vaccine. / Every year.  Tetanus, diphtheria, and acellular pertussis (Tdap/Td) vaccine.** / 1 dose of Td every 10 years.  Varicella vaccine.** / Consult your health care provider.  Zoster vaccine.** / 1 dose for adults aged 60 years or older.  Pneumococcal 13-valent conjugate (PCV13) vaccine.** / Consult your health care provider.  Pneumococcal polysaccharide (PPSV23) vaccine.** / 1 dose for all adults aged 65 years and older.  Meningococcal vaccine.** / Consult your health care  provider.  Hepatitis A vaccine.** / Consult your health care provider.  Hepatitis B vaccine.** / Consult your health care provider.  Haemophilus influenzae type b (Hib) vaccine.** / Consult your health care provider. **Family history and personal history of risk and conditions may change your health care provider's recommendations. Document Released: 12/28/2001 Document Revised: 11/06/2013 Document Reviewed: 03/29/2011 ExitCare Patient Information 2015 [employer]. This information is not intended to replace advice given to you by your health care provider. Make sure you discuss any questions you have with your health care provider.   

## 2015-02-26 NOTE — Progress Notes (Signed)
Pre visit review using our clinic review tool, if applicable. No additional management support is needed unless otherwise documented below in the visit note. 

## 2015-02-26 NOTE — Assessment & Plan Note (Signed)
Well-controlled, last potassium slightly low. Plan: Continue with potassium, check a BMP and CBC

## 2015-02-26 NOTE — Assessment & Plan Note (Addendum)
Rarely uses Xanax

## 2015-02-26 NOTE — Assessment & Plan Note (Addendum)
Unable to take more than 500 mg twice a day of metformin, needs better control. Unwilling to take Januvia Plan: Add actos , return to the office 3 months

## 2015-02-26 NOTE — Assessment & Plan Note (Signed)
Currently well controlled, hardly ever uses hydrocodone

## 2015-02-26 NOTE — Progress Notes (Signed)
Subjective:    Patient ID: [name] [name], female    DOB: April 16, 1946, 69 y.o.   MRN: [id number]  DOS:  02/26/2015 Type of visit - description :   Here for Medicare AWV: 1. Risk factors based on Past M, S, F history: reviewed 2. Physical Activities: occ take walks, does some yard work , overall  stays active 3. Depression/mood: No depression or anxiety  4. Hearing: No problemss noted or reported  5. ADL's: Independent , drives  6. Fall Risk: had a fall 08-2015, no major injury,prevention discussed 7. home Safety: does feel safe at home  8. Height, weight, &visual acuity: see VS, sees eye doctor regularly, due for a visit  9. Counseling: provided 10. Labs ordered based on risk factors: if needed  11. Referral Coordination: if needed 12. Care Plan, see assessment and plan , written plan provided 13. Cognitive Assessment: Motor skills and cognition within normal for age 62. Care team updated 15. End of life care discussed, she already discussed the issue  w/ her family  In addition, today we discussed the following: DJD, currently doing well, not taking hydrocodone Hypertension, good compliance of medication, BP today is great High cholesterol, good compliance with medicine without apparent side effects Insomnia, very seldom takes alprazolam Diabetes, unable to tolerate more than metformin 500 mg twice a day. Currently blood sugars in the 160-170 range. When she was taking more metformin sugars were better but again unable to tolerate.  Review of Systems  Constitutional: No fever, chills. No unexplained wt changes. No unusual sweats HEENT: No dental problems, ear discharge, facial swelling, voice changes. No eye discharge, redness or intolerance to light Respiratory: No wheezing or difficulty breathing. No cough , mucus production Cardiovascular: No CP, leg swelling or palpitations GI: no nausea, vomiting, diarrhea or abdominal pain.  No blood in the stools. No  dysphagia   Endocrine: No polyphagia, polyuria or polydipsia GU: No dysuria, gross hematuria, difficulty urinating. No urinary urgency or frequency. Musculoskeletal: No joint swellings or unusual aches or pains Skin: No change in the color of the skin, palor or rash Allergic, immunologic: some environmental allergies , no food allergies Neurological: No dizziness or syncope. No headaches. No diplopia, slurred speech, motor deficits, facial numbness Hematological: No enlarged lymph nodes, easy bruising or bleeding Psychiatry: No suicidal ideas, hallucinations, behavior problems or confusion. No unusual/severe anxiety or depression.     Past Medical History  Diagnosis Date  . Diabetes mellitus     w/ neuropathy  . Hyperlipidemia   . Hypertension   . Osteoarthritis     gout, CPPD--- sees Dr.Davenshwar  . Osteopenia     per DEXA 4/10  . Recurrent boils     h/o MRSA  . Chest pain 06/2007    CP and QW on EKG: neg stress test   . Diverticulitis 2000  . Insomnia 02/20/2013    Past Surgical History  Procedure Laterality Date  . Cholecystectomy    . Tonsillectomy      History   Social History  . Marital Status: Married    Spouse Name: herbert  . Number of Children: 3  . Years of Education: college   Occupational History  . 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 Smoking status: Never Smoker   . Smokeless tobacco: Never Used  . Alcohol Use: No  .  Drug Use: No  . Sexual Activity: Not on file   Other Topics Concern  . Not on file   Social History Narrative   Patient is married with 3 children.   Patient is right handed.   Patient has college education.   Patient drinks 3-4 daily.   Family History  Problem Relation Age of Onset  . Hypertension Daughter   . Hypertension Mother   . Hypertension Father   . Hypertension Sister   . Diabetes Mother   . Diabetes  Father   . Diabetes Sister   . Coronary artery disease Neg Hx   . Stroke Neg Hx   . Colon cancer Neg Hx   . Breast cancer Neg Hx          Medication List       This list is accurate as of: 02/26/15  6:23 PM.  Always use your most recent med list.               ALPRAZolam 0.5 MG tablet  Commonly known as:  XANAX  Take 0.5-1 tablets (0.25-0.5 mg total) by mouth at bedtime as needed for sleep.     aspirin 81 MG tablet  Take 81 mg by mouth daily.     CLARITIN PO  Take 10 mg by mouth daily.     diltiazem 300 MG 24 hr capsule  Commonly known as:  CARDIZEM CD  TAKE ONE CAPSULE BY MOUTH DAILY     glucose blood test strip  Check your blood sugar 1-2 times daily.     HYDROcodone-acetaminophen 7.5-325 MG per tablet  Commonly known as:  NORCO  Take 1 tablet by mouth.     metFORMIN 500 MG tablet  Commonly known as:  GLUCOPHAGE  Take 1 tablet twice daily with a meal for 2 weeks, then increase to 1 tablet three times daily with a meal.     metoCLOPramide 5 MG tablet  Commonly known as:  REGLAN  Take 1 tablet (5 mg total) by mouth 3 (three) times daily.     OMEGA 3 PO  Take 1 tablet by mouth daily. OTC     ONE TOUCH ULTRA SYSTEM KIT W/DEVICE Kit  Check your blood sugar 1-2 times daily.     onetouch ultrasoft lancets  Check your blood sugar 1-2 times daily.     pioglitazone 30 MG tablet  Commonly known as:  ACTOS  Take 1 tablet (30 mg total) by mouth daily.     pravastatin 20 MG tablet  Commonly known as:  PRAVACHOL  TAKE 1 TABLET (20 MG TOTAL) BY MOUTH AT BEDTIME.     TURMERIC PO  Take 1 tablet by mouth daily. OTC     Vitamin D 2000 UNITS Caps  Take 2,000 Units by mouth daily.           Objective:   Physical Exam BP 122/62 mmHg  Pulse 81  Temp(Src) 98.1 F (36.7 C) (Oral)  Ht 5' 3"  (1.6 m)  Wt 136 lb 8 oz (61.916 kg)  BMI 24.19 kg/m2  SpO2 98% General:   Well developed, well nourished . NAD.  HEENT:  Normocephalic . Face symmetric,  atraumatic Lungs:  CTA B Normal respiratory effort, no intercostal retractions, no accessory muscle use. Heart: RRR,  no murmur.  Abdomen:  Not distended, soft, non-tender. No rebound or rigidity. No mass,organomegaly; no Diabetic foot exam: No lower extremity edema normal pedal pulses, pinprick examination negative, some toe deformities due to DJD. Neurologic:  alert & oriented X3.  Speech normal, gait appropriate for age and unassisted Psych--  Cognition and judgment appear intact.  Cooperative with normal attention span and concentration.  Behavior appropriate. No anxious or depressed appearing.       Assessment & Plan:

## 2015-02-26 NOTE — Assessment & Plan Note (Signed)
Not a major issue at this point, takes Reglan as needed

## 2015-03-18 ENCOUNTER — Other Ambulatory Visit: Payer: Self-pay | Admitting: Internal Medicine

## 2015-03-18 NOTE — Telephone Encounter (Signed)
Pt is requesting refill on Reglan.  Last OV: 02/26/2015 Last Fill: 02/20/2013 #[id number]  Please advise.

## 2015-03-18 NOTE — Telephone Encounter (Signed)
Ok 90 and 1 

## 2015-03-18 NOTE — Telephone Encounter (Signed)
Refills sent to [facility].

## 2015-04-30 ENCOUNTER — Ambulatory Visit (INDEPENDENT_AMBULATORY_CARE_PROVIDER_SITE_OTHER): Payer: Medicare Other | Admitting: Internal Medicine

## 2015-04-30 ENCOUNTER — Encounter: Payer: Self-pay | Admitting: Internal Medicine

## 2015-04-30 VITALS — BP 102/70 | HR 79 | Temp 97.8°F | Ht 63.0 in | Wt 140.0 lb

## 2015-04-30 DIAGNOSIS — H6123 Impacted cerumen, bilateral: Secondary | ICD-10-CM

## 2015-04-30 DIAGNOSIS — E119 Type 2 diabetes mellitus without complications: Secondary | ICD-10-CM

## 2015-04-30 LAB — HEMOGLOBIN A1C: Hgb A1c MFr Bld: 6.4 % (ref 4.6–6.5)

## 2015-04-30 LAB — ALT: ALT: 19 U/L (ref 0–35)

## 2015-04-30 LAB — AST: AST: 18 U/L (ref 0–37)

## 2015-04-30 MED ORDER — DILTIAZEM HCL ER COATED BEADS 300 MG PO CP24: 300.0000 mg | ORAL_CAPSULE | Freq: Every day | ORAL | Status: DC

## 2015-04-30 MED ORDER — METFORMIN HCL 500 MG PO TABS: 500.0000 mg | ORAL_TABLET | Freq: Three times a day (TID) | ORAL | Status: DC

## 2015-04-30 MED ORDER — PRAVASTATIN SODIUM 20 MG PO TABS: 20.0000 mg | ORAL_TABLET | Freq: Every day | ORAL | Status: DC

## 2015-04-30 NOTE — Progress Notes (Signed)
Subjective:    Patient ID: [name] [name], female    DOB: [birth year], 69 y.o.   MRN: [id number]  DOS:  04/30/2015 Type of visit - description : rov Interval history:  Patient is a 69 year old female with a history of hypertension and diabetes in today for routine medical care.  Diabetes: Patient states good compliance with medications, noting that she is actually taking metformin 500 mg three times daily instead of bid and she is taking actos as prescribed. She notes no side effects from medications, denies symptoms of CHF like peripheral edema and PND. Only uses one pillow to sleep. She is checking CBGs regularly and they typically fall around 130. She also notes she is doing well with eating right and staying active.   Ear Check: Patient also requests an ear exam because she feels there is debris or cerumen in her ears. She denies any hearing loss.   Review of Systems  Constitutional: No fever. No chills. No unexplained wt changes.  Respiratory: No wheezing, no  difficulty breathing.   Cardiovascular: No CP  GI: no nausea, no vomiting, no diarrhea , no  abdominal pain.  No blood in the stools. No constipation.  GU: No dysuria, gross hematuria, difficulty urinating. No urinary urgency, no frequency.  Musculoskeletal: Left knee swollen (baseline) mild pain in hands and left knee.  Neurological: No dizziness or headaches. No diplopia.   Past Medical History  Diagnosis Date  . Diabetes mellitus     w/ neuropathy  . Hyperlipidemia   . Hypertension   . Osteoarthritis     gout, CPPD--- sees Dr.Davenshwar  . Osteopenia     per DEXA 4/10  . Recurrent boils     h/o MRSA  . Chest pain 06/2007    CP and QW on EKG: neg stress test   . Diverticulitis 2000  . Insomnia 02/20/2013    Past Surgical History  Procedure Laterality Date  . Cholecystectomy    . Tonsillectomy      History   Social History  . Marital Status: Married    Spouse Name: herbert  . Number of Children:  3  . Years of Education: college   Occupational History  . 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 Smoking status: Never Smoker   . Smokeless tobacco: Never Used  . Alcohol Use: No  . Drug Use: No  . Sexual Activity: Not on file   Other Topics Concern  . Not on file   Social History Narrative   Patient is married with 3 children.   Patient is right handed.   Patient has college education.   Patient drinks 3-4 daily.        Medication List       This list is accurate as of: 04/30/15  6:26 PM.  Always use your most recent med list.               ALPRAZolam 0.5 MG tablet  Commonly known as:  XANAX  Take 0.5-1 tablets (0.25-0.5 mg total) by mouth at bedtime as needed for sleep.     aspirin 81 MG tablet  Take 81 mg by mouth daily.     CLARITIN PO  Take 10 mg by mouth daily.     diltiazem 300 MG 24 hr capsule  Commonly known as:  CARDIZEM CD  Take 1 capsule (300 mg total) by mouth daily.     glucose blood test strip  Check your blood sugar 1-2 times daily.     HYDROcodone-acetaminophen 7.5-325 MG per tablet  Commonly known as:  NORCO  Take 1 tablet by mouth.     metFORMIN 500 MG tablet  Commonly known as:  GLUCOPHAGE  Take 1 tablet (500 mg total) by mouth 3 (three) times daily with meals.     metoCLOPramide 5 MG tablet  Commonly known as:  REGLAN  Take 1 tablet (5 mg total) by mouth 3 (three) times daily before meals.     OMEGA 3 PO  Take 1 tablet by mouth daily. OTC     ONE TOUCH ULTRA SYSTEM KIT W/DEVICE Kit  Check your blood sugar 1-2 times daily.     onetouch ultrasoft lancets  Check your blood sugar 1-2 times daily.     pioglitazone 30 MG tablet  Commonly known as:  ACTOS  Take 1 tablet (30 mg total) by mouth daily.     pravastatin 20 MG tablet  Commonly known as:  PRAVACHOL  Take 1 tablet (20 mg total) by mouth at bedtime.      TURMERIC PO  Take 1 tablet by mouth daily. OTC     Vitamin D 2000 UNITS Caps  Take 2,000 Units by mouth daily.           Objective:   Physical Exam BP 102/70 mmHg  Pulse 79  Temp(Src) 97.8 F (36.6 C) (Oral)  Ht 5' 3"  (1.6 m)  Wt 140 lb (63.504 kg)  BMI 24.81 kg/m2  SpO2 99%  General:   Well developed, well nourished . NAD.  HEENT:  Normocephalic . Face symmetric, atraumatic. L auditory canal with cerumen impaction, R auditory canal has some skin debris Following cerumen removal of L ear, L TM clear, nonbulging, nonperforated. Lungs:  CTA B Normal respiratory effort, no intercostal retractions, no accessory muscle use. Heart: RRR,  no murmur.  no pretibial edema bilaterally  Abdomen:  Not distended, soft, non-tender. No rebound or rigidity. No mass, organomegaly Skin: Not pale. Not jaundice Neurologic:  alert & oriented X3.  Speech normal, gait appropriate for age and unassisted Psych--  Cognition and judgment appear intact.  Cooperative with normal attention span and concentration.  Behavior appropriate. No anxious or depressed appearing.      Assessment & Plan:   (Patient seen along with   Aletta Edouard, medical student)  Cerumen impaction Patient complains of feeling like her ears are stopped up. She notes no hearing loss.  On exam her left ear is impacted with cerumen and right ear has some epithelial debris.  Plan: Lavage of left ear performed. Patient counseled on using peroxide to soften cerumen to reduce chance of impaction. Patient will use peroxide in right ear to soften debris and then have it removed in a few weeks if it is still bothering her.

## 2015-04-30 NOTE — Progress Notes (Signed)
Pre visit review using our clinic review tool, if applicable. No additional management support is needed unless otherwise documented below in the visit note. 

## 2015-04-30 NOTE — Assessment & Plan Note (Signed)
Patient notes blood glucose is well-controlled with medications, CBGs typically run around 130.  Currently taking metformin 500 mg TID instead of BID (previously reported poor tolerance with more than metformin bid ) and Actos regularly.  Notes good tolerance of both of these medications, no signs of CHF (no PND, peripheral edema, DOE) from Actos. Plan: Check A1c today.  Continue current regimen of metformin 500 mg TID and Actos daily.  Continue improving diet and exercise.

## 2015-04-30 NOTE — Patient Instructions (Addendum)
Get your blood work before you leave   Continue using peroxide daily on your ears

## 2015-06-11 ENCOUNTER — Telehealth: Payer: Self-pay | Admitting: Internal Medicine

## 2015-06-11 NOTE — Telephone Encounter (Signed)
Spoke with patient who stated that she was given a cortisone injection by Dr. Corliss Skains but the swelling is not resolved.  She would like to talk about changing her medications (Actos- see note below) due to the swelling.  Notified that she should be seen by provider before changing medications.    Scheduled with Esperanza Richters, PA 06/12/15.

## 2015-06-11 NOTE — Telephone Encounter (Signed)
Relation to pt: self  Call back number:[phone]   Reason for call:   Last couple of weeks patient experiencing left foot swelling, patient seen by orthopedic. orthropedic advised foot swelling is from water build up patient receiving cortisone shot to relieve swelling. Patient did some research regarding pioglitazone medication Dr. Drue Novel prescribed and one of the side effects are retaining water. Patient stopped taking medication .patient in need of clinical advice

## 2015-06-11 NOTE — Telephone Encounter (Signed)
Please advise 

## 2015-06-11 NOTE — Telephone Encounter (Signed)
I would recommend that she schedule office visit for evaluation of swelling and further med recommendations.

## 2015-06-12 ENCOUNTER — Encounter: Payer: Self-pay | Admitting: Medical

## 2015-06-12 ENCOUNTER — Ambulatory Visit (INDEPENDENT_AMBULATORY_CARE_PROVIDER_SITE_OTHER): Payer: Medicare Other | Admitting: Medical

## 2015-06-12 VITALS — BP 135/75 | HR 93 | Temp 98.6°F | Ht 63.0 in | Wt 141.8 lb

## 2015-06-12 DIAGNOSIS — M25569 Pain in unspecified knee: Secondary | ICD-10-CM

## 2015-06-12 DIAGNOSIS — N390 Urinary tract infection, site not specified: Secondary | ICD-10-CM

## 2015-06-12 DIAGNOSIS — E119 Type 2 diabetes mellitus without complications: Secondary | ICD-10-CM

## 2015-06-12 DIAGNOSIS — G8929 Other chronic pain: Secondary | ICD-10-CM

## 2015-06-12 DIAGNOSIS — R82998 Other abnormal findings in urine: Secondary | ICD-10-CM

## 2015-06-12 DIAGNOSIS — R609 Edema, unspecified: Secondary | ICD-10-CM

## 2015-06-12 LAB — POCT URINALYSIS DIPSTICK
BILIRUBIN UA: POSITIVE
Blood, UA: NEGATIVE
GLUCOSE UA: NEGATIVE
Ketones, UA: NEGATIVE
Nitrite, UA: NEGATIVE
PH UA: 6
Protein, UA: NEGATIVE
SPEC GRAV UA: 1.02
Urobilinogen, UA: 0.2

## 2015-06-12 NOTE — Progress Notes (Signed)
Subjective:    Patient ID: [name] [name], female    DOB: November 09, 1946, 69 y.o.   MRN: [id number]  HPI   Pt in with report of some pedal edema after seeing orthopedist. Pt had some cortisol injection of both knees last week. Pt states her left knee still hurts and mild swollen. Pt saw Dr. Corliss Skains.  Pt pain level 5/10 left knee. No popliteal pain. Pt can't tolerate nsaids. Pain meds are too sedating.  Pt states after injection felt better one day. But then pain came back.     Pt had brief swelling of left pretibial area. She saw actos insert and thought maybe side effects. Pt not having any sob, or any wheezing. Pt does not have any pretibial swelling. No chest pain. No blood in urine today on dip.    Review of Systems  Constitutional: Negative for fever, chills, diaphoresis, activity change and fatigue.  Respiratory: Negative for cough, chest tightness, shortness of breath and wheezing.   Cardiovascular: Negative for chest pain, palpitations and leg swelling.  Gastrointestinal: Negative for vomiting, abdominal pain and blood in stool.  Musculoskeletal: Negative for back pain, neck pain and neck stiffness.       Bilateral knee pain.  Neurological: Negative for dizziness and headaches.  Hematological: Negative for adenopathy. Does not bruise/bleed easily.  Psychiatric/Behavioral: Negative for behavioral problems and confusion.   Past Medical History  Diagnosis Date  . Diabetes mellitus     w/ neuropathy  . Hyperlipidemia   . Hypertension   . Osteoarthritis     gout, CPPD--- sees Dr.Davenshwar  . Osteopenia     per DEXA 4/10  . Recurrent boils     h/o MRSA  . Chest pain 06/2007    CP and QW on EKG: neg stress test   . Diverticulitis 2000  . Insomnia 02/20/2013    History   Social History  . Marital Status: Married    Spouse Name: herbert  . Number of Children: 3  . Years of Education: college   Occupational History  . 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 Smoking status: Never Smoker   . Smokeless tobacco: Never Used  . Alcohol Use: No  . Drug Use: No  . Sexual Activity: Not on file   Other Topics Concern  . Not on file   Social History Narrative   Patient is married with 3 children.   Patient is right handed.   Patient has college education.   Patient drinks 3-4 daily.    Past Surgical History  Procedure Laterality Date  . Cholecystectomy    . Tonsillectomy      Family History  Problem Relation Age of Onset  . Hypertension Daughter   . Hypertension Mother   . Hypertension Father   . Hypertension Sister   . Diabetes Mother   . Diabetes Father   . Diabetes Sister   . Coronary artery disease Neg Hx   . Stroke Neg Hx   . Colon cancer Neg Hx   . Breast cancer Neg Hx     Allergies  Allergen Reactions  . Celecoxib     REACTION: RASH  . Glimepiride Rash    Current Outpatient Prescriptions on File Prior to Visit  Medication Sig Dispense Refill  . aspirin 81 MG tablet Take 81 mg by mouth daily.      Marland Kitchen  Cholecalciferol (VITAMIN D) 2000 UNITS CAPS Take 2,000 Units by mouth daily.     Marland Kitchen diltiazem (CARDIZEM CD) 300 MG 24 hr capsule Take 1 capsule (300 mg total) by mouth daily. 30 capsule 6  . HYDROcodone-acetaminophen (NORCO) 7.5-325 MG per tablet Take 1 tablet by mouth.     . Loratadine (CLARITIN PO) Take 10 mg by mouth daily.     . metFORMIN (GLUCOPHAGE) 500 MG tablet Take 1 tablet (500 mg total) by mouth 3 (three) times daily with meals. 90 tablet 5  . metoCLOPramide (REGLAN) 5 MG tablet Take 1 tablet (5 mg total) by mouth 3 (three) times daily before meals. 90 tablet 1  . Omega-3 Fatty Acids (OMEGA 3 PO) Take 1 tablet by mouth daily. OTC    . pioglitazone (ACTOS) 30 MG tablet Take 1 tablet (30 mg total) by mouth daily. 30 tablet 6  . pravastatin (PRAVACHOL) 20 MG tablet Take 1 tablet (20 mg total) by mouth at  bedtime. 30 tablet 6  . TURMERIC PO Take 1 tablet by mouth daily. OTC    . ALPRAZolam (XANAX) 0.5 MG tablet Take 0.5-1 tablets (0.25-0.5 mg total) by mouth at bedtime as needed for sleep. (Patient not taking: Reported on 02/24/2015) 30 tablet 1   No current facility-administered medications on file prior to visit.    BP 135/75 mmHg  Pulse 93  Temp(Src) 98.6 F (37 C) (Oral)  Ht 5\' 3"  (1.6 m)  Wt 141 lb 12.8 oz (64.32 kg)  BMI 25.13 kg/m2  SpO2 99%       Objective:   Physical Exam   General- No acute distress. Pleasant patient. Neck- Full range of motion, no jvd Lungs- Clear, even and unlabored. Heart- regular rate and rhythm. Neurologic- CNII- XII grossly intact.  Abdomen- soft, nt, nd, +bs.   Back- no cva tendernes. Knees- from. Negative homans signs. No pretibial edema. Lt knee mild more swollen than rt side.     Assessment & Plan:  Knee pain- tylenol for fever. Follow up with ortho.   Regarding your actos use. You could restart that. But if recurrent symptoms of sings then  notify us.  Follow up  10 days or as needed

## 2015-06-12 NOTE — Progress Notes (Signed)
Pre visit review using our clinic review tool, if applicable. No additional management support is needed unless otherwise documented below in the visit note. 

## 2015-06-12 NOTE — Patient Instructions (Signed)
Knee pain- tylenol for fever. Follow up with ortho.   Regarding your actos use. You could restart that. But if recurrent symptoms of sings then  notify us.  Follow up  10 days or as needed

## 2015-06-14 LAB — URINE CULTURE: Colony Count: 3000

## 2015-06-30 LAB — HM DIABETES EYE EXAM

## 2015-07-25 ENCOUNTER — Other Ambulatory Visit: Payer: Self-pay | Admitting: Internal Medicine

## 2015-08-15 ENCOUNTER — Telehealth: Payer: Self-pay | Admitting: Internal Medicine

## 2015-08-15 NOTE — Telephone Encounter (Signed)
Please schedule office visit to discuss other medications

## 2015-08-15 NOTE — Telephone Encounter (Signed)
Can be reached: [phone] Pharmacy: [facility] on [address]  Reason for call: Pt called stating she is not taking pioglitazone (ACTOS) 30 MG tablet. She said she took it a few times and it made her feel bad so she stopped taking it. She said she is taking the metformin. This morning her blood sugar was 177. She said that it's usually in the 120s to 130s. She doesn't know if she needs to come in sooner than scheduled appt 10/14 or if a new med can be sent in. She would like a call.

## 2015-08-15 NOTE — Telephone Encounter (Signed)
LMOM informing Pt that Dr. Drue Novel would like for her to come into the office to discuss medications next week. Informed her to call office to schedule appt at her convenience and to watch her diet over the weekend and go to urgent care or ED if blood sugars become extremely elevated.

## 2015-08-15 NOTE — Telephone Encounter (Signed)
Scheduled appt for 10/4

## 2015-08-15 NOTE — Telephone Encounter (Signed)
Please advise, see telephone note from 7/28 and OV with Edward on 7/29.

## 2015-08-19 ENCOUNTER — Other Ambulatory Visit: Payer: Self-pay

## 2015-08-19 ENCOUNTER — Ambulatory Visit (INDEPENDENT_AMBULATORY_CARE_PROVIDER_SITE_OTHER): Payer: Medicare Other | Admitting: Internal Medicine

## 2015-08-19 ENCOUNTER — Encounter: Payer: Self-pay | Admitting: Internal Medicine

## 2015-08-19 ENCOUNTER — Ambulatory Visit: Payer: Medicare Other | Admitting: Internal Medicine

## 2015-08-19 VITALS — BP 120/64 | HR 76 | Temp 98.1°F | Ht 63.0 in | Wt 137.5 lb

## 2015-08-19 DIAGNOSIS — Z09 Encounter for follow-up examination after completed treatment for conditions other than malignant neoplasm: Secondary | ICD-10-CM | POA: Insufficient documentation

## 2015-08-19 DIAGNOSIS — H6123 Impacted cerumen, bilateral: Secondary | ICD-10-CM | POA: Diagnosis not present

## 2015-08-19 DIAGNOSIS — Z23 Encounter for immunization: Secondary | ICD-10-CM

## 2015-08-19 DIAGNOSIS — E785 Hyperlipidemia, unspecified: Secondary | ICD-10-CM

## 2015-08-19 DIAGNOSIS — E119 Type 2 diabetes mellitus without complications: Secondary | ICD-10-CM | POA: Diagnosis not present

## 2015-08-19 LAB — HEMOGLOBIN A1C: Hgb A1c MFr Bld: 7.3 % — ABNORMAL HIGH (ref 4.6–6.5)

## 2015-08-19 NOTE — Progress Notes (Signed)
Subjective:    Patient ID: [name] [name], female    DOB: 1946/03/02, 69 y.o.   MRN: [id number]  DOS:  08/19/2015 Type of visit - description : Diabetes management Interval history:  Was recommended Actos, states that every time she took it she "felt bad". Cannot be more specific than that but she does not like to go back on it. Admits to some abdominal pain related to actos. CBGs currently 149, 170. Sometimes 229 depending on diet. High cholesterol: Good compliance with Pravachol Cerumen impaction: Still has some discomfort at the left ear  Review of Systems Had a URI, symptoms resolve, currently with no cough, fever chills Denies any difficulty breathing or lower extremity edema on Actos. No nausea, vomiting, diarrhea.  Past Medical History  Diagnosis Date  . Diabetes mellitus     w/ neuropathy  . Hyperlipidemia   . Hypertension   . Osteoarthritis     gout, CPPD--- sees Dr.Davenshwar  . Osteopenia     per DEXA 4/10  . Recurrent boils     h/o MRSA  . Chest pain 06/2007    CP and QW on EKG: neg stress test   . Diverticulitis 2000  . Insomnia 02/20/2013    Past Surgical History  Procedure Laterality Date  . Cholecystectomy    . Tonsillectomy      Social History   Social History  . Marital Status: Married    Spouse Name: herbert  . Number of Children: 3  . Years of Education: college   Occupational History  . 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 Smoking status: Never Smoker   . Smokeless tobacco: Never Used  . Alcohol Use: No  . Drug Use: No  . Sexual Activity: Not on file   Other Topics Concern  . Not on file   Social History Narrative   Patient is married with 3 children.   Patient is right handed.   Patient has college education.   Patient drinks 3-4 daily.        Medication List       This list is accurate as of: 08/19/15  5:59 PM.   Always use your most recent med list.               ALPRAZolam 0.5 MG tablet  Commonly known as:  XANAX  Take 0.5-1 tablets (0.25-0.5 mg total) by mouth at bedtime as needed for sleep.     aspirin 81 MG tablet  Take 81 mg by mouth daily.     CLARITIN PO  Take 10 mg by mouth daily.     diltiazem 300 MG 24 hr capsule  Commonly known as:  CARDIZEM CD  Take 1 capsule (300 mg total) by mouth daily.     HYDROcodone-acetaminophen 7.5-325 MG tablet  Commonly known as:  NORCO  Take 1 tablet by mouth.     metFORMIN 500 MG tablet  Commonly known as:  GLUCOPHAGE  Take 1 tablet (500 mg total) by mouth 3 (three) times daily with meals.     metoCLOPramide 5 MG tablet  Commonly known as:  REGLAN  Take 1 tablet (5 mg total) by mouth 3 (three) times daily before meals.     OMEGA 3 PO  Take 1 tablet by mouth daily. OTC     ONE TOUCH ULTRA TEST test strip  Generic drug:  glucose blood  Use as directed.     pravastatin 20 MG tablet  Commonly known as:  PRAVACHOL  Take 1 tablet (20 mg total) by mouth at bedtime.     TURMERIC PO  Take 1 tablet by mouth daily. OTC     Vitamin D 2000 UNITS Caps  Take 2,000 Units by mouth daily.           Objective:   Physical Exam BP 120/64 mmHg  Pulse 76  Temp(Src) 98.1 F (36.7 C) (Oral)  Ht  (1.6 m)  Wt 137 lb 8 oz (62.37 kg)  BMI 24.36 kg/m2  SpO2 97% General:   Well developed, well nourished . NAD.  HEENT:  Normocephalic . Face symmetric, atraumatic + Cerumen impaction Lungs:  CTA B Normal respiratory effort, no intercostal retractions, no accessory muscle use. Heart: RRR,  no murmur.  No pretibial edema bilaterally  Skin: Not pale. Not jaundice Neurologic:  alert & oriented X3.  Speech normal, gait appropriate for age and unassisted Psych--  Cognition and judgment appear intact.  Cooperative with normal attention span and concentration.  Behavior appropriate. No anxious or depressed appearing.      Assessment &  Plan:   Assessment > DM neuropathy Self d/c glimepiride ~01/2015  (?s/e), unwilling to take Januvia d/t "bad reports", other DPP4s $$. Actos intolerant 07-2015 HTN Hyperlipidemia Insomnia MSK: Dr Victory Dakin --DJD --Gout --CPPD Osteopenia H/o recurrent boils (MRSA) H/o Diverticulitis H/o CP: 2008 negative stress test  Plan: DM: Unable to take a number of medications including glimepiride, Januvia and now Actos. Check A1c. Further advice would results. Cerumen impaction: Procedure note, with the alligator forceps we removed abundant dry wax from both ears Hyperlipidemia: Continue with Pravachol Primary care: Flu shot today RTC 3 months

## 2015-08-19 NOTE — Assessment & Plan Note (Signed)
DM: Unable to take a number of medications including glimepiride, Januvia and now Actos. Check A1c. Further advice would results. Cerumen impaction: Procedure note, with the alligator forceps we removed abundant dry wax from both ears Hyperlipidemia: Continue with Pravachol Primary care: Flu shot today RTC 3 months

## 2015-08-19 NOTE — Progress Notes (Signed)
Pre visit review using our clinic review tool, if applicable. No additional management support is needed unless otherwise documented below in the visit note. 

## 2015-08-19 NOTE — Patient Instructions (Signed)
Get your blood work before you leave   Next visit  for a routine checkup in 3 months   Please schedule an appointment at the front desk

## 2015-08-21 ENCOUNTER — Other Ambulatory Visit: Payer: Self-pay | Admitting: Internal Medicine

## 2015-08-21 MED ORDER — ACARBOSE 25 MG PO TABS: 25.0000 mg | ORAL_TABLET | Freq: Three times a day (TID) | ORAL | Status: DC

## 2015-08-29 ENCOUNTER — Ambulatory Visit: Payer: Self-pay | Admitting: Internal Medicine

## 2015-10-01 ENCOUNTER — Other Ambulatory Visit: Payer: Self-pay

## 2015-10-01 DIAGNOSIS — Z1231 Encounter for screening mammogram for malignant neoplasm of breast: Secondary | ICD-10-CM

## 2015-10-06 ENCOUNTER — Ambulatory Visit
Admission: RE | Admit: 2015-10-06 | Discharge: 2015-10-06 | Disposition: A | Discharge status: Home / Self Care | Payer: Medicare Other | Source: Ambulatory Visit

## 2015-10-06 DIAGNOSIS — Z1231 Encounter for screening mammogram for malignant neoplasm of breast: Secondary | ICD-10-CM

## 2015-11-18 ENCOUNTER — Ambulatory Visit (INDEPENDENT_AMBULATORY_CARE_PROVIDER_SITE_OTHER): Payer: Medicare Other | Admitting: Internal Medicine

## 2015-11-18 ENCOUNTER — Encounter: Payer: Self-pay | Admitting: Internal Medicine

## 2015-11-18 VITALS — BP 116/76 | HR 68 | Temp 97.8°F | Ht 63.0 in | Wt 140.4 lb

## 2015-11-18 DIAGNOSIS — I10 Essential (primary) hypertension: Secondary | ICD-10-CM

## 2015-11-18 DIAGNOSIS — E119 Type 2 diabetes mellitus without complications: Secondary | ICD-10-CM | POA: Diagnosis not present

## 2015-11-18 LAB — HEMOGLOBIN A1C: HEMOGLOBIN A1C: 7 % — AB (ref 4.6–6.5)

## 2015-11-18 LAB — BASIC METABOLIC PANEL
BUN: 9 mg/dL (ref 6–23)
CHLORIDE: 105 meq/L (ref 96–112)
CO2: 26 meq/L (ref 19–32)
Calcium: 9.7 mg/dL (ref 8.4–10.5)
Creatinine, Ser: 0.61 mg/dL (ref 0.40–1.20)
GFR: 125.28 mL/min (ref >60.00)
Glucose, Bld: 166 mg/dL — ABNORMAL HIGH (ref 70–99)
Potassium: 3.7 mEq/L (ref 3.5–5.1)
SODIUM: 141 meq/L (ref 135–145)

## 2015-11-18 LAB — MICROALBUMIN / CREATININE URINE RATIO
CREATININE, U: 124.8 mg/dL
Microalb Creat Ratio: 0.6 mg/g (ref 0.0–30.0)
Microalb, Ur: <0.7 mg/dL (ref 0.0–1.9)

## 2015-11-18 NOTE — Progress Notes (Signed)
Pre visit review using our clinic review tool, if applicable. No additional management support is needed unless otherwise documented below in the visit note. 

## 2015-11-18 NOTE — Progress Notes (Signed)
Subjective:    Patient ID: [name] SearingBetty M [name], female    DOB: 05/28/1946, 70 y.o.   MRN: [id number]  DOS:  11/18/2015 Type of visit - description : Routine checkup Interval history: DM: Started Acarbose 3 times a day, occasionally she misses a dose. Ambulatory CBGs around 130, 140. HTN: Good compliance with meds, BP today is very good, labs reviewed, due for a BMP   Review of Systems  Denies chest pain or difficulty breathing. No blood in the stools.  Past Medical History  Diagnosis Date  . Diabetes mellitus     w/ neuropathy  . Hyperlipidemia   . Hypertension   . Osteoarthritis     gout, CPPD--- sees Dr.Davenshwar  . Osteopenia     per DEXA 4/10  . Recurrent boils     h/o MRSA  . Chest pain 06/2007    CP and QW on EKG: neg stress test   . Diverticulitis 2000  . Insomnia 02/20/2013    Past Surgical History  Procedure Laterality Date  . Cholecystectomy    . Tonsillectomy      Social History   Social History  . Marital Status: Married    Spouse Name: herbert  . Number of Children: 3  . Years of Education: college   Occupational History  . 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 Smoking status: Never Smoker   . Smokeless tobacco: Never Used  . Alcohol Use: No  . Drug Use: No  . Sexual Activity: Not on file   Other Topics Concern  . Not on file   Social History Narrative   Patient is married with 3 children.   Patient is right handed.   Patient has college education.   Patient drinks 3-4 daily.        Medication List       This list is accurate as of: 11/18/15  5:52 PM.  Always use your most recent med list.               acarbose 25 MG tablet  Commonly known as:  PRECOSE  Take 1 tablet (25 mg total) by mouth 3 (three) times daily with meals.     ALPRAZolam 0.5 MG tablet  Commonly known as:  XANAX  Take 0.5-1 tablets (0.25-0.5 mg total)  by mouth at bedtime as needed for sleep.     aspirin 81 MG tablet  Take 81 mg by mouth daily.     CLARITIN PO  Take 10 mg by mouth daily.     diltiazem 300 MG 24 hr capsule  Commonly known as:  CARDIZEM CD  Take 1 capsule (300 mg total) by mouth daily.     HYDROcodone-acetaminophen 7.5-325 MG tablet  Commonly known as:  NORCO  Take 1 tablet by mouth. Reported on 11/18/2015     metFORMIN 500 MG tablet  Commonly known as:  GLUCOPHAGE  Take 1 tablet (500 mg total) by mouth 3 (three) times daily with meals.     metoCLOPramide 5 MG tablet  Commonly known as:  REGLAN  Take 1 tablet (5 mg total) by mouth 3 (three) times daily before meals.     OMEGA 3 PO  Take 1 tablet by mouth daily. Reported on 11/18/2015     ONE TOUCH ULTRA TEST test strip  Generic drug:  glucose blood  Reported  on 11/18/2015     pravastatin 20 MG tablet  Commonly known as:  PRAVACHOL  Take 1 tablet (20 mg total) by mouth at bedtime.     TURMERIC PO  Take 1 tablet by mouth daily. OTC     Vitamin D 2000 units Caps  Take 2,000 Units by mouth daily. Reported on 11/18/2015           Objective:   Physical Exam BP 116/76 mmHg  Pulse 68  Temp(Src) 97.8 F (36.6 C) (Oral)  Ht 5\' 3"  (1.6 m)  Wt 140 lb 6 oz (63.674 kg)  BMI 24.87 kg/m2  SpO2 96% General:   Well developed, well nourished . NAD.  HEENT:  Normocephalic . Face symmetric, atraumatic. Right TM normal, left TM slightly bulge, no red Lungs:  CTA B Normal respiratory effort, no intercostal retractions, no accessory muscle use. Heart: RRR,  no murmur.  No pretibial edema bilaterally  Skin: Not pale. Not jaundice Neurologic:  alert & oriented X3.  Speech normal, gait appropriate for age and unassisted Psych--  Cognition and judgment appear intact.  Cooperative with normal attention span and concentration.  Behavior appropriate. No anxious or depressed appearing.      Assessment & Plan:   Assessment > DM neuropathy Self d/c glimepiride  ~01/2015  (?s/e), unwilling to take Januvia d/t "bad reports", other DPP4s $$. Actos intolerant 07-2015, started acarbose 08-2015 HTN Hyperlipidemia Insomnia MSK: Dr Victory Dakin --DJD --Gout --CPPD Osteopenia Gi: Sees DR Madilyn Fireman H/o diverticulitis H/o recurrent boils (MRSA) H/o CP: 2008 negative stress test  PLAN DM: Started Acarbose 08-2015,   Compliance not 100%, check A1c and microalbumin. Encourage compliance. HTN: Well-controlled, check a BMP Also complaining of "ear congestion". On exam + fluid behind the TM: Prescribe Flonase. RTC  02/2016 CPX

## 2015-11-18 NOTE — Patient Instructions (Signed)
BEFORE YOU LEAVE THE OFFICE:  GO TO THE LAB  Get the blood work    GO TO THE FRONT DESK Schedule a complete physical exam to be done in April 2017 Please be fasting  We can also see you in the afternoon and check labs the next day    USE FLONASE 2 SPRAYS ON EACH SIDE OF THE NOSE EVERY DAY

## 2015-11-21 MED ORDER — ACARBOSE 50 MG PO TABS: 50.0000 mg | ORAL_TABLET | Freq: Three times a day (TID) | ORAL | Status: DC

## 2015-11-21 NOTE — Addendum Note (Signed)
Addended by: Conrad BurlingtonANTER, Cassadie Pankonin D on: 11/21/2015 09:47 AM   Modules accepted: Orders, Medications

## 2015-11-25 ENCOUNTER — Ambulatory Visit: Payer: Medicare Other | Admitting: Internal Medicine

## 2015-11-27 ENCOUNTER — Telehealth: Payer: Self-pay | Admitting: Internal Medicine

## 2015-11-27 MED ORDER — FLUTICASONE PROPIONATE 50 MCG/ACT NA SUSP: 2.0000 | Freq: Every day | NASAL | Status: DC

## 2015-11-27 NOTE — Telephone Encounter (Signed)
Rx sent 

## 2015-11-27 NOTE — Telephone Encounter (Signed)
Relation to ON:GEXBpt:self Call back number:[phone] [facility] [address]- [place], [place] - [address] [phone] (Phone) [phone])         Reason for call:  Patient states PCP advised her to take Flonase and she is requesting a prescription for Flonase be sent to her pharmcy

## 2015-12-02 ENCOUNTER — Other Ambulatory Visit: Payer: Self-pay | Admitting: Internal Medicine

## 2015-12-08 ENCOUNTER — Other Ambulatory Visit: Payer: Self-pay | Admitting: Internal Medicine

## 2015-12-16 LAB — HM COLONOSCOPY

## 2015-12-24 ENCOUNTER — Encounter: Payer: Self-pay | Admitting: Internal Medicine

## 2016-01-14 ENCOUNTER — Other Ambulatory Visit: Payer: Self-pay | Admitting: Internal Medicine

## 2016-01-31 ENCOUNTER — Other Ambulatory Visit: Payer: Self-pay | Admitting: Internal Medicine

## 2016-03-01 ENCOUNTER — Telehealth: Payer: Self-pay | Admitting: *Deleted

## 2016-03-01 NOTE — Telephone Encounter (Signed)
Pt not available at time of Pre-Visit Call. Pt will try to call back later this afternoon.

## 2016-03-01 NOTE — Telephone Encounter (Signed)
Returned call, no answer.

## 2016-03-01 NOTE — Telephone Encounter (Signed)
Pt is returning your call

## 2016-03-02 ENCOUNTER — Encounter: Payer: Self-pay | Admitting: Internal Medicine

## 2016-03-02 ENCOUNTER — Ambulatory Visit (INDEPENDENT_AMBULATORY_CARE_PROVIDER_SITE_OTHER): Payer: Medicare Other | Admitting: Internal Medicine

## 2016-03-02 VITALS — BP 122/64 | HR 80 | Temp 97.8°F | Ht 63.0 in | Wt 139.1 lb

## 2016-03-02 DIAGNOSIS — E119 Type 2 diabetes mellitus without complications: Secondary | ICD-10-CM

## 2016-03-02 DIAGNOSIS — M8580 Other specified disorders of bone density and structure, unspecified site: Secondary | ICD-10-CM

## 2016-03-02 DIAGNOSIS — E785 Hyperlipidemia, unspecified: Secondary | ICD-10-CM

## 2016-03-02 DIAGNOSIS — Z09 Encounter for follow-up examination after completed treatment for conditions other than malignant neoplasm: Secondary | ICD-10-CM

## 2016-03-02 DIAGNOSIS — Z0000 Encounter for general adult medical examination without abnormal findings: Secondary | ICD-10-CM

## 2016-03-02 DIAGNOSIS — I10 Essential (primary) hypertension: Secondary | ICD-10-CM | POA: Diagnosis not present

## 2016-03-02 NOTE — Patient Instructions (Signed)
Get your blood work before you leave   Please consider visit these websites for more information about the healthcare power of attorney:  www.begintheconversation.org  theconversationproject.org  Next visit in 4 months, no fasting. Please make an appointment  Fall Prevention and Home Safety Falls cause injuries and can affect all age groups. It is possible to use preventive measures to significantly decrease the likelihood of falls. There are many simple measures which can make your home safer and prevent falls. OUTDOORS  Repair cracks and edges of walkways and driveways.  Remove high doorway thresholds.  Trim shrubbery on the main path into your home.  Have good outside lighting.  Clear walkways of tools, rocks, debris, and clutter.  Check that handrails are not broken and are securely fastened. Both sides of steps should have handrails.  Have leaves, snow, and ice cleared regularly.  Use sand or salt on walkways during winter months.  In the garage, clean up grease or oil spills. BATHROOM  Install night lights.  Install grab bars by the toilet and in the tub and shower.  Use non-skid mats or decals in the tub or shower.  Place a plastic non-slip stool in the shower to sit on, if needed.  Keep floors dry and clean up all water on the floor immediately.  Remove soap buildup in the tub or shower on a regular basis.  Secure bath mats with non-slip, double-sided rug tape.  Remove throw rugs and tripping hazards from the floors. BEDROOMS  Install night lights.  Make sure a bedside light is easy to reach.  Do not use oversized bedding.  Keep a telephone by your bedside.  Have a firm chair with side arms to use for getting dressed.  Remove throw rugs and tripping hazards from the floor. KITCHEN  Keep handles on pots and pans turned toward the center of the stove. Use back burners when possible.  Clean up spills quickly and allow time for drying.  Avoid  walking on wet floors.  Avoid hot utensils and knives.  Position shelves so they are not too high or low.  Place commonly used objects within easy reach.  If necessary, use a sturdy step stool with a grab bar when reaching.  Keep electrical cables out of the way.  Do not use floor polish or wax that makes floors slippery. If you must use wax, use non-skid floor wax.  Remove throw rugs and tripping hazards from the floor. STAIRWAYS  Never leave objects on stairs.  Place handrails on both sides of stairways and use them. Fix any loose handrails. Make sure handrails on both sides of the stairways are as long as the stairs.  Check carpeting to make sure it is firmly attached along stairs. Make repairs to worn or loose carpet promptly.  Avoid placing throw rugs at the top or bottom of stairways, or properly secure the rug with carpet tape to prevent slippage. Get rid of throw rugs, if possible.  Have an electrician put in a light switch at the top and bottom of the stairs. OTHER FALL PREVENTION TIPS  Wear low-heel or rubber-soled shoes that are supportive and fit well. Wear closed toe shoes.  When using a stepladder, make sure it is fully opened and both spreaders are firmly locked. Do not climb a closed stepladder.  Add color or contrast paint or tape to grab bars and handrails in your home. Place contrasting color strips on first and last steps.  Learn and use mobility aids as  needed. Install an electrical emergency response system.  Turn on lights to avoid dark areas. Replace light bulbs that burn out immediately. Get light switches that glow.  Arrange furniture to create clear pathways. Keep furniture in the same place.  Firmly attach carpet with non-skid or double-sided tape.  Eliminate uneven floor surfaces.  Select a carpet pattern that does not visually hide the edge of steps.  Be aware of all pets. OTHER HOME SAFETY TIPS  Set the water temperature for 120 F  (48.8 C).  Keep emergency numbers on or near the telephone.  Keep smoke detectors on every level of the home and near sleeping areas. Document Released: 10/22/2002 Document Revised: 05/02/2012 Document Reviewed: 01/21/2012 [facility] Patient Information 2015 [place] [employer], [place]. This information is not intended to replace advice given to you by your health care provider. Make sure you discuss any questions you have with your health care provider.   Preventive Care for Adults Ages 7265 and over  Blood pressure check.** / Every 1 to 2 years.  Lipid and cholesterol check.**/ Every 5 years beginning at age 69.  Lung cancer screening. / Every year if you are aged 55-80 years and have a 30-pack-year history of smoking and currently smoke or have quit within the past 15 years. Yearly screening is stopped once you have quit smoking for at least 15 years or develop a health problem that would prevent you from having lung cancer treatment.  Fecal occult blood test (FOBT) of stool. / Every year beginning at age 69 and continuing until age 69. You may not have to do this test if you get a colonoscopy every 10 years.  Flexible sigmoidoscopy** or colonoscopy.** / Every 5 years for a flexible sigmoidoscopy or every 10 years for a colonoscopy beginning at age 69 and continuing until age 69.  Hepatitis C blood test.** / For all people born from [id number] through 1964 and any individual with known risks for hepatitis C.  Abdominal aortic aneurysm (AAA) screening.** / A one-time screening for ages 265 to 7375 years who are current or former smokers.  Skin self-exam. / Monthly.  Influenza vaccine. / Every year.  Tetanus, diphtheria, and acellular pertussis (Tdap/Td) vaccine.** / 1 dose of Td every 10 years.  Varicella vaccine.** / Consult your health care provider.  Zoster vaccine.** / 1 dose for adults aged 69 years or older.  Pneumococcal 13-valent conjugate (PCV13) vaccine.** / Consult your health care  provider.  Pneumococcal polysaccharide (PPSV23) vaccine.** / 1 dose for all adults aged 69 years and older.  Meningococcal vaccine.** / Consult your health care provider.  Hepatitis A vaccine.** / Consult your health care provider.  Hepatitis B vaccine.** / Consult your health care provider.  Haemophilus influenzae type b (Hib) vaccine.** / Consult your health care provider. **Family history and personal history of risk and conditions may change your health care provider's recommendations. Document Released: 12/28/2001 Document Revised: 11/06/2013 Document Reviewed: 03/29/2011 [facility] Patient Information 2015 [place], [place]. This information is not intended to replace advice given to you by your health care provider. Make sure you discuss any questions you have with your health care provider.

## 2016-03-02 NOTE — Progress Notes (Signed)
Subjective:    Patient ID: [name] [name], female    DOB: Apr 03, 1946, 70 y.o.   MRN: [id number]  DOS:  03/02/2016 Type of visit - description : CPX Interval history: In addition to her physical exam we discussed the following issues: HTN: Good compliance of medications, BP today is very good, ambulatory BPs within normal High cholesterol: On Pravachol, aches and pains at baseline MSK: Sees rheumatology, has ongoing problems with hip and knee pain, receiving local injections.Hydrocodone was prescribed but couldn't tolerate Also few days history of pain at the inner aspect of the right thigh, no red, swelling, injury but it is somewhat unusual.  Insomnia, well-controlled with Xanax as needed DM: Good compliance of medication, CBGs in the morning 150, 160.  Review of Systems  Constitutional: No fever. No chills. No unexplained wt changes. No unusual sweats  HEENT: No dental problems, no ear discharge, no facial swelling, no voice changes. No eye discharge, no eye  redness , no  intolerance to light   Respiratory: No wheezing , no  difficulty breathing. No cough , no mucus production  Cardiovascular: No CP, no leg swelling , no  Palpitations  GI: no nausea, no vomiting, no diarrhea , no  abdominal pain.  No blood in the stools. No dysphagia, no odynophagia    Endocrine: No polyphagia, no polyuria , no polydipsia  GU: No dysuria, gross hematuria, difficulty urinating. No urinary urgency, no frequency.  Musculoskeletal: Ongoing aches and pains. See above.  Skin: No change in the color of the skin, palor , no  Rash  Allergic, immunologic: No environmental allergies , no  food allergies  Neurological: No dizziness no  syncope. No headaches. No diplopia, no slurred, no slurred speech, no motor deficits, no facial  Numbness  Hematological: No enlarged lymph nodes, no easy bruising , no unusual bleedings  Psychiatry: No suicidal ideas, no hallucinations, no beavior problems, no  confusion.  No unusual/severe anxiety, no depression  Past Medical History  Diagnosis Date  . Diabetes mellitus     w/ neuropathy  . Hyperlipidemia   . Hypertension   . Osteoarthritis     gout, CPPD--- sees Dr.Davenshwar  . Osteopenia     per DEXA 4/10  . Recurrent boils     h/o MRSA  . Chest pain 06/2007    CP and QW on EKG: neg stress test   . Diverticulitis 2000  . Insomnia 02/20/2013    Past Surgical History  Procedure Laterality Date  . Cholecystectomy    . Tonsillectomy      Social History   Social History  . Marital Status: Married    Spouse Name: herbert  . Number of Children: 3  . Years of Education: college   Occupational History  . 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 Smoking status: Never Smoker   . Smokeless tobacco: Never Used  . Alcohol Use: No  . Drug Use: No  . Sexual Activity: Not on file   Other Topics Concern  . Not on file   Social History Narrative   Lives w/ husband   Patient is married with 3 children.   Patient is right handed.   Patient has college education.   Patient drinks 3-4 daily.     Family History  Problem Relation Age of Onset  . Hypertension Daughter   .  Hypertension Mother   . Hypertension Father   . Hypertension Sister   . Diabetes Mother   . Diabetes Father   . Diabetes Sister   . Coronary artery disease Neg Hx   . Stroke Neg Hx   . Colon cancer Neg Hx   . Breast cancer Neg Hx       Medication List       This list is accurate as of: 03/02/16 11:59 PM.  Always use your most recent med list.               acarbose 50 MG tablet  Commonly known as:  PRECOSE  Take 1 tablet (50 mg total) by mouth 3 (three) times daily with meals.     ALPRAZolam 0.5 MG tablet  Commonly known as:  XANAX  Take 0.5-1 tablets (0.25-0.5 mg total) by mouth at bedtime as needed for sleep.     aspirin 81 MG tablet    Take 81 mg by mouth daily.     CLARITIN PO  Take 10 mg by mouth daily.     diltiazem 300 MG 24 hr capsule  Commonly known as:  CARDIZEM CD  Take 1 capsule (300 mg total) by mouth daily.     fluticasone 50 MCG/ACT nasal spray  Commonly known as:  FLONASE  Place 2 sprays into both nostrils daily.     metFORMIN 500 MG tablet  Commonly known as:  GLUCOPHAGE  Take 1 tablet (500 mg total) by mouth 3 (three) times daily with meals.     metoCLOPramide 5 MG tablet  Commonly known as:  REGLAN  Take 1 tablet (5 mg total) by mouth 3 (three) times daily before meals.     OMEGA 3 PO  Take 1 tablet by mouth daily. Reported on 11/18/2015     ONE TOUCH ULTRA TEST test strip  Generic drug:  glucose blood  CHECK YOUR BLOOD SUGAR 1-2 TIMES DAILY.     pravastatin 20 MG tablet  Commonly known as:  PRAVACHOL  Take 1 tablet (20 mg total) by mouth at bedtime.     TURMERIC PO  Take 1 tablet by mouth daily. OTC     Vitamin D 2000 units Caps  Take 2,000 Units by mouth daily. Reported on 11/18/2015           Objective:   Physical Exam BP 122/64 mmHg  Pulse 80  Temp(Src) 97.8 F (36.6 C) (Oral)  Ht 5\' 3"  (1.6 m)  Wt 139 lb 2 oz (63.107 kg)  BMI 24.65 kg/m2  SpO2 98%  General:   Well developed, well nourished . NAD.  Neck: No  thyromegaly   HEENT:  Normocephalic . Face symmetric, atraumatic Lungs:  CTA B Normal respiratory effort, no intercostal retractions, no accessory muscle use. Heart: RRR,  no murmur.  No pretibial edema bilaterally  Abdomen:  Not distended, soft, non-tender. No rebound or rigidity.   Lower extremities: Normal to inspection on palpation of the inner aspect of the right thigh; No inguinal hernia Skin: Exposed areas without rash. Not pale. Not jaundice Neurologic:  alert & oriented X3.  Speech normal, gait appropriate for age and unassisted Strength symmetric and appropriate for age.  Psych: Cognition and judgment appear intact.  Cooperative with normal  attention span and concentration.  Behavior appropriate. No anxious or depressed appearing.    Assessment & Plan:   Assessment > DM neuropathy Self d/c glimepiride ~01/2015  (?s/e), unwilling to take Januvia d/t "bad reports", other DPP4s $$.  Actos intolerant 07-2015, started acarbose 08-2015 HTN Hyperlipidemia Insomnia - xanax prn MSK: Dr Victory Dakin --[facility] --Gout --CPPD Osteopenia:  t score 2010 : -1.1, 03-2012: -1.3 GI: Sees DR Madilyn Fireman Gastroparesis (+ gastric empty study 2012) - reglan prn H/o diverticulitis H/o recurrent boils (MRSA) H/o CP: 2008 negative stress test  PLAN DM: Good compliance with acarbose-metformin, check labs. If not at goal consider insulin although the patient is somewhat opposed to that HTN: Seems well-controlled on Cardizem. Check CBC Hyperlipidemia: Continue Pravachol, check AST, ALT and FLP MSK: Follow-up by rheumatology. Pain in the inner right leg, exam is normal, rx  observation for now Osteopenia: Check a bone density test, no history of vitamin D deficiency RTC 4 months

## 2016-03-02 NOTE — Progress Notes (Signed)
Pre visit review using our clinic review tool, if applicable. No additional management support is needed unless otherwise documented below in the visit note. 

## 2016-03-02 NOTE — Assessment & Plan Note (Addendum)
Td 2011;  pneumonia shot 2008 and 2013 (PNM 23);  PNM 13 --2015; zostavax rx provided before but can't afford  Cscope 2006, November 2011, 11-2015: tics, no polyps, next per GI (5 years per report)  Female care , saw gyn last 2015,   plans to call and set up an appointment Mammogram 09-2015 neg  Diet-exercise discussed

## 2016-03-03 LAB — LIPID PANEL
CHOLESTEROL: 184 mg/dL (ref 0–200)
HDL: 90.2 mg/dL (ref >39.00)
LDL Cholesterol: 83 mg/dL (ref 0–99)
NonHDL: 94.09
Total CHOL/HDL Ratio: 2
Triglycerides: 54 mg/dL (ref 0.0–149.0)
VLDL: 10.8 mg/dL (ref 0.0–40.0)

## 2016-03-03 LAB — CBC WITH DIFFERENTIAL/PLATELET
BASOS PCT: 0.9 % (ref 0.0–3.0)
Basophils Absolute: 0 10*3/uL (ref 0.0–0.1)
EOS PCT: 2.1 % (ref 0.0–5.0)
Eosinophils Absolute: 0.1 10*3/uL (ref 0.0–0.7)
HCT: 38.4 % (ref 36.0–46.0)
HEMOGLOBIN: 12.7 g/dL (ref 12.0–15.0)
Lymphocytes Relative: 40.1 % (ref 12.0–46.0)
Lymphs Abs: 2.1 10*3/uL (ref 0.7–4.0)
MCHC: 33.1 g/dL (ref 30.0–36.0)
MCV: 87.9 fl (ref 78.0–100.0)
MONO ABS: 0.3 10*3/uL (ref 0.1–1.0)
Monocytes Relative: 6 % (ref 3.0–12.0)
Neutro Abs: 2.7 10*3/uL (ref 1.4–7.7)
Neutrophils Relative %: 50.9 % (ref 43.0–77.0)
Platelets: 285 10*3/uL (ref 150.0–400.0)
RBC: 4.36 Mil/uL (ref 3.87–5.11)
RDW: 15.4 % (ref 11.5–15.5)
WBC: 5.4 10*3/uL (ref 4.0–10.5)

## 2016-03-03 LAB — ALT: ALT: 17 U/L (ref 0–35)

## 2016-03-03 LAB — AST: AST: 16 U/L (ref 0–37)

## 2016-03-03 LAB — HEMOGLOBIN A1C: HEMOGLOBIN A1C: 7.2 % — AB (ref 4.6–6.5)

## 2016-03-03 NOTE — Assessment & Plan Note (Signed)
DM: Good compliance with acarbose-metformin, check labs. If not at goal consider insulin although the patient is somewhat opposed to that HTN: Seems well-controlled on Cardizem. Check CBC Hyperlipidemia: Continue Pravachol, check AST, ALT and FLP MSK: Follow-up by rheumatology. Pain in the inner right leg, exam is normal, rx  observation for now Osteopenia: Check a bone density test, no history of vitamin D deficiency RTC 4 months

## 2016-03-24 ENCOUNTER — Ambulatory Visit (INDEPENDENT_AMBULATORY_CARE_PROVIDER_SITE_OTHER): Payer: Medicare Other | Admitting: Podiatry

## 2016-03-24 ENCOUNTER — Encounter: Payer: Self-pay | Admitting: Podiatry

## 2016-03-24 VITALS — BP 132/75 | HR 84 | Resp 18

## 2016-03-24 DIAGNOSIS — E119 Type 2 diabetes mellitus without complications: Secondary | ICD-10-CM

## 2016-03-24 DIAGNOSIS — M79673 Pain in unspecified foot: Secondary | ICD-10-CM

## 2016-03-24 DIAGNOSIS — B351 Tinea unguium: Secondary | ICD-10-CM | POA: Diagnosis not present

## 2016-03-24 NOTE — Patient Instructions (Signed)
Today her diabetic foot screen revealed adequate circulation and feeling in your feet. Return every 3 months for trimming of thickened deformed toenails  Diabetes and Foot Care Diabetes may cause you to have problems because of poor blood supply (circulation) to your feet and legs. This may cause the skin on your feet to become thinner, break easier, and heal more slowly. Your skin may become dry, and the skin may peel and crack. You may also have nerve damage in your legs and feet causing decreased feeling in them. You may not notice minor injuries to your feet that could lead to infections or more serious problems. Taking care of your feet is one of the most important things you can do for yourself.  HOME CARE INSTRUCTIONS  Wear shoes at all times, even in the house. Do not go barefoot. Bare feet are easily injured.  Check your feet daily for blisters, cuts, and redness. If you cannot see the bottom of your feet, use a mirror or ask someone for help.  Wash your feet with warm water (do not use hot water) and mild soap. Then pat your feet and the areas between your toes until they are completely dry. Do not soak your feet as this can dry your skin.  Apply a moisturizing lotion or petroleum jelly (that does not contain alcohol and is unscented) to the skin on your feet and to dry, brittle toenails. Do not apply lotion between your toes.  Trim your toenails straight across. Do not dig under them or around the cuticle. File the edges of your nails with an emery board or nail file.  Do not cut corns or calluses or try to remove them with medicine.  Wear clean socks or stockings every day. Make sure they are not too tight. Do not wear knee-high stockings since they may decrease blood flow to your legs.  Wear shoes that fit properly and have enough cushioning. To break in new shoes, wear them for just a few hours a day. This prevents you from injuring your feet. Always look in your shoes before you  put them on to be sure there are no objects inside.  Do not cross your legs. This may decrease the blood flow to your feet.  If you find a minor scrape, cut, or break in the skin on your feet, keep it and the skin around it clean and dry. These areas may be cleansed with mild soap and water. Do not cleanse the area with peroxide, alcohol, or iodine.  When you remove an adhesive bandage, be sure not to damage the skin around it.  If you have a wound, look at it several times a day to make sure it is healing.  Do not use heating pads or hot water bottles. They may burn your skin. If you have lost feeling in your feet or legs, you may not know it is happening until it is too late.  Make sure your health care provider performs a complete foot exam at least annually or more often if you have foot problems. Report any cuts, sores, or bruises to your health care provider immediately. SEEK MEDICAL CARE IF:   You have an injury that is not healing.  You have cuts or breaks in the skin.  You have an ingrown nail.  You notice redness on your legs or feet.  You feel burning or tingling in your legs or feet.  You have pain or cramps in your legs and feet.  Your legs or feet are numb.  Your feet always feel cold. SEEK IMMEDIATE MEDICAL CARE IF:   There is increasing redness, swelling, or pain in or around a wound.  There is a red line that goes up your leg.  Pus is coming from a wound.  You develop a fever or as directed by your health care provider.  You notice a bad smell coming from an ulcer or wound.   This information is not intended to replace advice given to you by your health care provider. Make sure you discuss any questions you have with your health care provider.   Document Released: 10/29/2000 Document Revised: 07/04/2013 Document Reviewed: 04/10/2013 Elsevier Interactive Patient Education [employer].

## 2016-03-24 NOTE — Progress Notes (Signed)
   Subjective:    Patient ID: [name] [name], female    DOB: 11/09/1946, 70 y.o.   MRN: [id number]  HPI   This patient presents today stating that her toenails are elongated and thickened with color changes and have gradually become more difficult for patient to trim in the past several months. Patient denies any recent podiatric care for this problem. Patient is a diabetic and denies any history of ulceration, claudication or amputation  Review of Systems  All other systems reviewed and are negative.      Objective:   Physical Exam  Orientated 3  Vascular: DP pulses 2/4 bilaterally PT pulses 2/4 bilaterally Capillary reflex immediate bilaterally  Neurological: Sensation to 10 g monofilament wire intact 5/5 bilaterally  Vibratory sensation intact bilaterally Ankle reflex equal and reactive bilaterally  Dermatological: No open skin lesions bilaterally The toenails are elongated incurvated discolored 6-10  Musculoskeletal: Pes planus bilaterally HAV bilaterally Hammertoe second left There is no restriction ankle, subtalar, midtarsal joints bilaterally Prominence dorsal midfoot bony areas palpated bilaterally       Assessment & Plan:   Assessment: Satisfactory neurovascular status Diabetic without foot complications Mycotic toenails 6-10  Plan: Debridement of toenails 6-10 mechanically and legs without any bleeding  Reappoint 3 months

## 2016-04-02 LAB — HEPATIC FUNCTION PANEL
ALT: 14 U/L (ref 7–35)
AST: 14 U/L (ref 13–35)
Alkaline Phosphatase: 68 U/L (ref 25–125)
BILIRUBIN, TOTAL: 0.4 mg/dL

## 2016-04-02 LAB — BASIC METABOLIC PANEL
BUN: 9 mg/dL (ref 4–21)
Creatinine: 0.8 mg/dL (ref 0.5–1.1)
GLUCOSE: 86 mg/dL
POTASSIUM: 4 mmol/L (ref 3.4–5.3)
SODIUM: 143 mmol/L (ref 137–147)

## 2016-04-02 LAB — CBC AND DIFFERENTIAL
HCT: 37 % (ref 36–46)
Hemoglobin: 12.3 g/dL (ref 12.0–16.0)
Platelets: 309 10*3/uL (ref 150–399)
WBC: 5.3 10*3/mL

## 2016-04-03 ENCOUNTER — Other Ambulatory Visit: Payer: Self-pay | Admitting: Internal Medicine

## 2016-04-29 ENCOUNTER — Other Ambulatory Visit: Payer: Self-pay | Admitting: Internal Medicine

## 2016-05-25 ENCOUNTER — Other Ambulatory Visit: Payer: Self-pay | Admitting: Internal Medicine

## 2016-05-26 ENCOUNTER — Ambulatory Visit: Payer: Medicare Other | Admitting: Podiatry

## 2016-06-15 ENCOUNTER — Telehealth: Payer: Self-pay | Admitting: *Deleted

## 2016-06-15 ENCOUNTER — Ambulatory Visit (INDEPENDENT_AMBULATORY_CARE_PROVIDER_SITE_OTHER): Payer: Medicare Other

## 2016-06-15 VITALS — BP 138/66 | HR 91 | Resp 14 | Ht 63.0 in | Wt 139.2 lb

## 2016-06-15 DIAGNOSIS — Z0000 Encounter for general adult medical examination without abnormal findings: Secondary | ICD-10-CM

## 2016-06-15 DIAGNOSIS — M8580 Other specified disorders of bone density and structure, unspecified site: Secondary | ICD-10-CM

## 2016-06-15 DIAGNOSIS — E119 Type 2 diabetes mellitus without complications: Secondary | ICD-10-CM | POA: Diagnosis not present

## 2016-06-15 DIAGNOSIS — Z780 Asymptomatic menopausal state: Secondary | ICD-10-CM

## 2016-06-15 NOTE — Patient Instructions (Signed)
Continue to eat heart healthy diet (full of fruits, vegetables, whole grains, lean protein, water--limit salt, fat, and sugar intake) and increase physical activity as tolerated. Continue doing brain stimulating activities (puzzles, reading, adult coloring books, staying active) to keep memory sharp.  Call your insurance company and ask how much it will cost you to get the shingles vaccine (Zostavax). Schedule your bone density scan at your convenience. Discuss creating advanced directives with your husband.  Follow-up with Dr. Drue Novel as scheduled.  Diabetes Mellitus and Food It is important for you to manage your blood sugar (glucose) level. Your blood glucose level can be greatly affected by what you eat. Eating healthier foods in the appropriate amounts throughout the day at about the same time each day will help you control your blood glucose level. It can also help slow or prevent worsening of your diabetes mellitus. Healthy eating may even help you improve the level of your blood pressure and reach or maintain a healthy weight.  General recommendations for healthful eating and cooking habits include:  Eating meals and snacks regularly. Avoid going long periods of time without eating to lose weight.  Eating a diet that consists mainly of plant-based foods, such as fruits, vegetables, nuts, legumes, and whole grains.  Using low-heat cooking methods, such as baking, instead of high-heat cooking methods, such as deep frying. Work with your dietitian to make sure you understand how to use the Nutrition Facts information on food labels. HOW CAN FOOD AFFECT ME? Carbohydrates Carbohydrates affect your blood glucose level more than any other type of food. Your dietitian will help you determine how many carbohydrates to eat at each meal and teach you how to count carbohydrates. Counting carbohydrates is important to keep your blood glucose at a healthy level, especially if you are using insulin or taking  certain medicines for diabetes mellitus. Alcohol Alcohol can cause sudden decreases in blood glucose (hypoglycemia), especially if you use insulin or take certain medicines for diabetes mellitus. Hypoglycemia can be a life-threatening condition. Symptoms of hypoglycemia (sleepiness, dizziness, and disorientation) are similar to symptoms of having too much alcohol.  If your health care provider has given you approval to drink alcohol, do so in moderation and use the following guidelines:  Women should not have more than one drink per day, and men should not have more than two drinks per day. One drink is equal to:  12 oz of beer.  5 oz of wine.  1 oz of hard liquor.  Do not drink on an empty stomach.  Keep yourself hydrated. Have water, diet soda, or unsweetened iced tea.  Regular soda, juice, and other mixers might contain a lot of carbohydrates and should be counted. WHAT FOODS ARE NOT RECOMMENDED? As you make food choices, it is important to remember that all foods are not the same. Some foods have fewer nutrients per serving than other foods, even though they might have the same number of calories or carbohydrates. It is difficult to get your body what it needs when you eat foods with fewer nutrients. Examples of foods that you should avoid that are high in calories and carbohydrates but low in nutrients include:  Trans fats (most processed foods list trans fats on the Nutrition Facts label).  Regular soda.  Juice.  Candy.  Sweets, such as cake, pie, doughnuts, and cookies.  Fried foods. WHAT FOODS CAN I EAT? Eat nutrient-rich foods, which will nourish your body and keep you healthy. The food you should eat also  will depend on several factors, including:  The calories you need.  The medicines you take.  Your weight.  Your blood glucose level.  Your blood pressure level.  Your cholesterol level. You should eat a variety of foods, including:  Protein.  Lean cuts of  meat.  Proteins low in saturated fats, such as fish, egg whites, and beans. Avoid processed meats.  Fruits and vegetables.  Fruits and vegetables that may help control blood glucose levels, such as apples, mangoes, and yams.  Dairy products.  Choose fat-free or low-fat dairy products, such as milk, yogurt, and cheese.  Grains, bread, pasta, and rice.  Choose whole grain products, such as multigrain bread, whole oats, and brown rice. These foods may help control blood pressure.  Fats.  Foods containing healthful fats, such as nuts, avocado, olive oil, canola oil, and fish. DOES EVERYONE WITH DIABETES MELLITUS HAVE THE SAME MEAL PLAN? Because every person with diabetes mellitus is different, there is not one meal plan that works for everyone. It is very important that you meet with a dietitian who will help you create a meal plan that is just right for you.   This information is not intended to replace advice given to you by your health care provider. Make sure you discuss any questions you have with your health care provider.   Document Released: 07/29/2005 Document Revised: 11/22/2014 Document Reviewed: 09/28/2013 Elsevier Interactive Patient Education [employer]

## 2016-06-15 NOTE — Telephone Encounter (Signed)
Pt in for AWV today, requested refill of Reglan. Reports she uses PRN for nausea/upset stomach related to dietary indiscretion. Please advise. She has follow-up appt 07/02/16.

## 2016-06-15 NOTE — Progress Notes (Addendum)
Subjective:   [name] [name] is a 70 y.o. female who presents for Medicare Annual (Subsequent) preventive examination.  Review of Systems:  No ROS.  Medicare Wellness Visit.  Cardiac Risk Factors include: advanced age (>46men, >44 women);diabetes mellitus;dyslipidemia;hypertension  Sleep patterns: Sleeps 'pretty good.' Sometimes wakes up tired, but recently got a new mattress and is adjusting to it. Gets up once nightly or less to void.    Home Safety/Smoke Alarms: Lives in a single family home w/ husband, no stairs in house, but does have 8-9 stairs off back deck.  Smoke alarms and security system. Living environment; residence and Firearm Safety: Stored in a safe place. Seat Belt Safety/Bike Helmet: Wears seat belt.   Counseling:   Eye Exam- Dr. Nile Riggs yearly.   Dental-No dentist currently, planning to call [facility] soon for appt as she broke a tooth on a cherry pit recently.  Female:   Pap- N/A       Mammo- 10/06/15 Bi-rads Category 1: Negative      Dexa scan- 08/01/12 Osteopenia; orders placed for repeat DEXA scan        CCS-12/16/15 at [facility], diverticula in sigmoid and descending colon     Objective:     Vitals: BP (!) 146/72 (BP Location: Right Arm, Patient Position: Sitting, Cuff Size: Normal)   Pulse 91   Resp 14   Ht 5\' 3"  (1.6 m)   Wt 139 lb 3.2 oz (63.1 kg)   SpO2 97%   BMI 24.66 kg/m   Body mass index is 24.66 kg/m.   Tobacco History  Smoking Status  . Never Smoker  Smokeless Tobacco  . Never Used     Counseling given: Not Answered   Past Medical History:  Diagnosis Date  . Bilateral knee pain    Euflexxa x3  . Chest pain 06/2007   CP and QW on EKG: neg stress test   . Diabetes mellitus    w/ neuropathy  . Diverticulitis 2000  . Hyperlipidemia   . Hypertension   . Insomnia 02/20/2013  . Osteoarthritis    gout, CPPD--- sees Dr.Davenshwar  . Osteopenia    per DEXA 4/10  . Recurrent boils    h/o MRSA   Past Surgical  History:  Procedure Laterality Date  . CHOLECYSTECTOMY    . TONSILLECTOMY     Family History  Problem Relation Age of Onset  . Hypertension Mother   . Diabetes Mother   . Hypertension Father   . Diabetes Father   . Hypertension Daughter   . Hypertension Sister   . Diabetes Sister   . Coronary artery disease Neg Hx   . Stroke Neg Hx   . Colon cancer Neg Hx   . Breast cancer Neg Hx    History  Sexual Activity  . Sexual activity: Not Currently    Outpatient Encounter Prescriptions as of 06/15/2016  Medication Sig  . acarbose (PRECOSE) 50 MG tablet Take 1 tablet (50 mg total) by mouth 3 (three) times daily with meals.  Marland Kitchen aspirin 81 MG tablet Take 81 mg by mouth daily.    . Cholecalciferol (VITAMIN D) 2000 UNITS CAPS Take 2,000 Units by mouth daily. Reported on 11/18/2015  . diltiazem (CARDIZEM CD) 300 MG 24 hr capsule Take 1 capsule (300 mg total) by mouth daily.  . fluticasone (FLONASE) 50 MCG/ACT nasal spray Place 2 sprays into both nostrils daily. (Patient taking differently: Place 2 sprays into both nostrils daily as needed. )  .  Loratadine (CLARITIN PO) Take 10 mg by mouth daily as needed.   . metFORMIN (GLUCOPHAGE) 500 MG tablet Take 1 tablet (500 mg total) by mouth 3 (three) times daily with meals.  . Omega-3 Fatty Acids (OMEGA 3 PO) Take 1 tablet by mouth daily. Reported on 11/18/2015  . ONE TOUCH ULTRA TEST test strip CHECK YOUR BLOOD SUGAR 1-2 TIMES DAILY.  . pravastatin (PRAVACHOL) 20 MG tablet Take 1 tablet (20 mg total) by mouth at bedtime.  . TURMERIC PO Take 1 tablet by mouth daily. OTC  . zolpidem (AMBIEN) 10 MG tablet Take 10 mg by mouth at bedtime as needed for sleep.  . metoCLOPramide (REGLAN) 5 MG tablet Take 1 tablet (5 mg total) by mouth 3 (three) times daily before meals. (Patient not taking: Reported on 06/15/2016)  . [DISCONTINUED] ALPRAZolam (XANAX) 0.5 MG tablet Take 0.5-1 tablets (0.25-0.5 mg total) by mouth at bedtime as needed for sleep. (Patient not taking:  Reported on 03/24/2016)   No facility-administered encounter medications on file as of 06/15/2016.     Activities of Daily Living In your present state of health, do you have any difficulty performing the following activities: 06/15/2016 03/02/2016  Hearing? N N  Vision? N N  Difficulty concentrating or making decisions? N N  Walking or climbing stairs? N Y  Dressing or bathing? N N  Doing errands, shopping? N N  Preparing Food and eating ? N -  Using the Toilet? N -  In the past six months, have you accidently leaked urine? N -  Do you have problems with loss of bowel control? N -  Managing your Medications? N -  Managing your Finances? N -  Housekeeping or managing your Housekeeping? N -  Some recent data might be hidden    Patient Care Team: Wanda Plump, MD as PCP - General Micki Riley, MD as Consulting Physician (Neurology) Tawni Pummel, PA-C as Physician Assistant (Orthopedic Surgery) Pollyann Savoy, MD as Consulting Physician (Rheumatology) Jethro Bolus, MD as Consulting Physician (Ophthalmology)    Assessment:    Physical assessment deferred to PCP.   Exercise Activities and Dietary recommendations Current Exercise Habits: The patient has a physically strenous job, but has no regular exercise apart from work.  Diet (meal preparation, eat out, water intake, caffeinated beverages, dairy products, fruits and vegetables): Prepares own meals, eats 2 meals daily. Snacks throughout the day on candy and chips, etc and does not eat lunch. Dinner meal varies. Activia nightly w/ medications before bedtime. Drinks about 2 bottles of water daily.  Breakfast: Oatmeal/cereal w/ coffee   Dietary counseling provided. Discussed increasing water intake and substituting snacks for healthier options.     Goals    . Increase physical activity          Start walking with your husband as able.      Fall Risk Fall Risk  06/15/2016 03/02/2016 08/19/2015 05/21/2014 02/20/2013  Falls in the past  year? No No No No No   Depression Screen PHQ 2/9 Scores 06/15/2016 03/02/2016 08/19/2015 05/21/2014  PHQ - 2 Score 0 0 0 0     Cognitive Testing MMSE - Mini Mental State Exam 06/15/2016  Orientation to time 5  Orientation to Place 5  Registration 3  Attention/ Calculation 5  Recall 1  Language- name 2 objects 2  Language- repeat 1  Language- follow 3 step command 3  Language- read & follow direction 1  Write a sentence 1  Copy design 1  Total  score 28    Immunization History  Administered Date(s) Administered  . Influenza Split 08/01/2012  . Influenza Whole 10/14/2009  . Influenza, High Dose Seasonal PF 09/12/2013, 08/19/2015  . Influenza,inj,Quad PF,36+ Mos 09/24/2014  . Pneumococcal Conjugate-13 01/15/2014  . Pneumococcal Polysaccharide-23 03/31/2012  . Td 08/14/2010   Screening Tests Health Maintenance  Topic Date Due  . ZOSTAVAX  06/19/2007  . DEXA SCAN  08/01/2014  . FOOT EXAM  02/26/2016  . INFLUENZA VACCINE  07/16/2016 (Originally 06/15/2016)  . Hepatitis C Screening  11/13/2016 (Originally [birth year])  . OPHTHALMOLOGY EXAM  06/29/2016  . HEMOGLOBIN A1C  09/01/2016  . MAMMOGRAM  10/05/2016  . URINE MICROALBUMIN  11/17/2016  . PNA vac Low Risk Adult (2 of 2 - PPSV23) 03/31/2017  . TETANUS/TDAP  08/14/2020  . COLONOSCOPY  12/15/2020      Plan:   Continue to eat heart healthy diet (full of fruits, vegetables, whole grains, lean protein, water--limit salt, fat, and sugar intake) and increase physical activity as tolerated. Continue doing brain stimulating activities (puzzles, reading, adult coloring books, staying active) to keep memory sharp.  Call your insurance company and ask how much it will cost you to get the shingles vaccine (Zostavax). Schedule your bone density scan at your convenience.  During the course of the visit the patient was educated and counseled about the following appropriate screening and preventive services:   Vaccines to include Pneumoccal,  Influenza, Hepatitis B, Td, Zostavax, HCV  Cardiovascular Disease  Colorectal cancer screening  Bone density screening  Diabetes screening  Glaucoma screening  Mammography/PAP  Nutrition counseling   Patient Instructions (the written plan) was given to the patient.   Starla Link, RN  06/15/2016   Agree, Tenna Child MD

## 2016-06-15 NOTE — Assessment & Plan Note (Signed)
Taking OTC Vitamin D supplement. Due for DEXA, orders placed.

## 2016-06-15 NOTE — Telephone Encounter (Signed)
Ok 90 and 1 RF 

## 2016-06-15 NOTE — Assessment & Plan Note (Signed)
Last A1c 7.2. Pt was supposed to f/u w/ PCP to discuss medication changes, but failed to do so. She has follow-up appt scheduled 07/02/16. Reports compliance w/ medication, but does admit to dietary indiscretion and eating things she shouldn't. Dietary counseling provided.

## 2016-06-16 MED ORDER — METOCLOPRAMIDE HCL 5 MG PO TABS: 5.0000 mg | ORAL_TABLET | Freq: Three times a day (TID) | ORAL | 1 refills | Status: DC | PRN

## 2016-06-16 NOTE — Telephone Encounter (Signed)
Refill pended. Called pt and left message to return call and let us know which pharmacy she would like it sent to.

## 2016-06-16 NOTE — Telephone Encounter (Signed)
Called patient, pharmacy verified [facility] [address]). Medication filled to pharmacy as requested.

## 2016-07-02 ENCOUNTER — Ambulatory Visit: Payer: Medicare Other | Admitting: Internal Medicine

## 2016-07-12 ENCOUNTER — Encounter: Payer: Self-pay | Admitting: Internal Medicine

## 2016-07-12 ENCOUNTER — Ambulatory Visit (INDEPENDENT_AMBULATORY_CARE_PROVIDER_SITE_OTHER): Payer: Medicare Other | Admitting: Internal Medicine

## 2016-07-12 VITALS — BP 126/66 | HR 69 | Temp 98.3°F | Resp 14 | Ht 63.0 in | Wt 140.2 lb

## 2016-07-12 DIAGNOSIS — I10 Essential (primary) hypertension: Secondary | ICD-10-CM | POA: Diagnosis not present

## 2016-07-12 DIAGNOSIS — R1030 Lower abdominal pain, unspecified: Secondary | ICD-10-CM

## 2016-07-12 DIAGNOSIS — E118 Type 2 diabetes mellitus with unspecified complications: Secondary | ICD-10-CM | POA: Diagnosis not present

## 2016-07-12 DIAGNOSIS — G4700 Insomnia, unspecified: Secondary | ICD-10-CM

## 2016-07-12 DIAGNOSIS — M8580 Other specified disorders of bone density and structure, unspecified site: Secondary | ICD-10-CM

## 2016-07-12 DIAGNOSIS — Z780 Asymptomatic menopausal state: Secondary | ICD-10-CM

## 2016-07-12 LAB — BASIC METABOLIC PANEL
BUN: 9 mg/dL (ref 6–23)
CHLORIDE: 105 meq/L (ref 96–112)
CO2: 26 meq/L (ref 19–32)
CREATININE: 0.64 mg/dL (ref 0.40–1.20)
Calcium: 9.3 mg/dL (ref 8.4–10.5)
GFR: 118.3 mL/min (ref >60.00)
Glucose, Bld: 114 mg/dL — ABNORMAL HIGH (ref 70–99)
POTASSIUM: 3.5 meq/L (ref 3.5–5.1)
Sodium: 140 mEq/L (ref 135–145)

## 2016-07-12 LAB — HEMOGLOBIN A1C: Hgb A1c MFr Bld: 7 % — ABNORMAL HIGH (ref 4.6–6.5)

## 2016-07-12 MED ORDER — ALPRAZOLAM 0.5 MG PO TABS
0.5000 mg | ORAL_TABLET | Freq: Every evening | ORAL | 1 refills | Status: DC | PRN
Start: 2016-07-12 — End: 2017-01-14

## 2016-07-12 MED ORDER — METFORMIN HCL 1000 MG PO TABS: 1000.0000 mg | ORAL_TABLET | Freq: Two times a day (BID) | ORAL | 6 refills | Status: DC

## 2016-07-12 MED ORDER — ACARBOSE 100 MG PO TABS: 100.0000 mg | ORAL_TABLET | Freq: Three times a day (TID) | ORAL | 3 refills | Status: DC

## 2016-07-12 NOTE — Progress Notes (Signed)
Subjective:    Patient ID: [name] [name], female    DOB: 01/07/1946, 70 y.o.   MRN: [id number]  DOS:  07/12/2016 Type of visit - description : Routine office visit Interval history:  DM: Good med compliance, ambulatory blood sugars 130, 140. HTN: Good medication compliance, no ambulatory BPs Also, ~ 3 weeks history of on and off left groin pain, denies any mass. Also complains of tingling on her feet but also her hands, on and off, unable to tell me  if is worse at night, not described as burning, can't describe sx further.  Insomnia: Ongoing problem, currently taking no medications.  Review of Systems  No major problems week neck pain or back pain. denies nausea, vomiting, diarrhea. No abdominal pain per se.  Past Medical History:  Diagnosis Date  . Bilateral knee pain    Euflexxa x3  . Chest pain 06/2007   CP and QW on EKG: neg stress test   . Diabetes mellitus    w/ neuropathy  . Diverticulitis 2000  . Hyperlipidemia   . Hypertension   . Insomnia 02/20/2013  . Osteoarthritis    gout, CPPD--- sees Dr.Davenshwar  . Osteopenia    per DEXA 4/10  . Recurrent boils    h/o MRSA    Past Surgical History:  Procedure Laterality Date  . CHOLECYSTECTOMY    . TONSILLECTOMY      Social History   Social History  . Marital status: Married    Spouse name: herbert  . Number of children: 3  . Years of education: college   Occupational History  . 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 Smoking status: Never Smoker  . Smokeless tobacco: Never Used  . Alcohol use No  . Drug use: No  . Sexual activity: Not Currently   Other Topics Concern  . Not on file   Social History Narrative   Lives w/ husband   Patient is married with 3 children.   Patient is right handed.   Patient has college education.   Patient drinks 3-4 daily.        Medication List         Accurate as of 07/12/16 11:59 PM. Always use your most recent med list.          acarbose 100 MG tablet Commonly known as:  PRECOSE Take 1 tablet (100 mg total) by mouth 3 (three) times daily with meals.   ALPRAZolam 0.5 MG tablet Commonly known as:  XANAX Take 1 tablet (0.5 mg total) by mouth at bedtime as needed for sleep.   aspirin 81 MG tablet Take 81 mg by mouth daily.   CLARITIN PO Take 10 mg by mouth daily as needed.   diltiazem 300 MG 24 hr capsule Commonly known as:  CARDIZEM CD Take 1 capsule (300 mg total) by mouth daily.   fluticasone 50 MCG/ACT nasal spray Commonly known as:  FLONASE Place 2 sprays into both nostrils daily.   metFORMIN 1000 MG tablet Commonly known as:  GLUCOPHAGE Take 1 tablet (1,000 mg total) by mouth 2 (two) times daily with a meal.   OMEGA 3 PO Take 1 tablet by mouth daily. Reported on 11/18/2015   ONE TOUCH ULTRA TEST test strip Generic drug:  glucose blood CHECK YOUR BLOOD SUGAR 1-2 TIMES DAILY.   pravastatin 20 MG tablet  Commonly known as:  PRAVACHOL Take 1 tablet (20 mg total) by mouth at bedtime.   TURMERIC PO Take 1 tablet by mouth daily. OTC   Vitamin D 2000 units Caps Take 2,000 Units by mouth daily. Reported on 11/18/2015          Objective:   Physical Exam BP 126/66 (BP Location: Left Arm, Patient Position: Sitting, Cuff Size: Normal)   Pulse 69   Temp 98.3 F (36.8 C) (Oral)   Resp 14   Ht 5\' 3"  (1.6 m)   Wt 140 lb 4 oz (63.6 kg)   SpO2 96%   BMI 24.84 kg/m  General:   Well developed, well nourished . NAD.  HEENT:  Normocephalic . Face symmetric, atraumatic Lungs:  CTA B Normal respiratory effort, no intercostal retractions, no accessory muscle use. Heart: RRR,  no murmur.  no pretibial edema bilaterally  Abdomen:  Not distended, soft, non-tender. No rebound or rigidity.  Groins: No mass, no hernias. DIABETIC FEET EXAM: No lower extremity edema Normal pedal pulses bilaterally Skin normal, nails  normal, no calluses Pinprick examination of the feet normal. Skin: Not pale. Not jaundice Neurologic:  alert & oriented X3.  Speech normal, gait appropriate for age and unassisted Psych--  Cognition and judgment appear intact.  Cooperative with normal attention span and concentration.  Behavior appropriate. No anxious or depressed appearing.    Assessment & Plan:   Assessment > DM   Self d/c glimepiride ~01/2015  (?s/e), unwilling to take Januvia d/t "bad reports", other DPP4s $$. Actos intolerant 07-2015, started acarbose 08-2015 Neuropathy? h/o  paresthesias, s/p eval orthopedic surgery and neurology 2015,  NCS 03-2014 no neuropathy ; B12 wnl 2015 HTN Hyperlipidemia Insomnia - Ambien was okay but $!; on xanax prn MSK: Dr Victory Dakin --DJD --Gout --CPPD Osteopenia:  t score 2010 : -1.1, 03-2012: -1.3 GI: Sees DR Madilyn Fireman Gastroparesis (+ gastric empty study 2012) - reglan prn H/o diverticulitis H/o recurrent boils (MRSA) H/o CP: 2008 negative stress test  PLAN DM: Last A1c 7.2, will increase metformin to 1000 bid and acarbose to 100 mg tid . Check A1c HTN: Continue Cardizem, last BMP satisfactory Insomnia: currently on no meds, ambien was $$, thus will go back to xanax  Paresthesias: Not a new issue, has complained of paresthesias on and off for few years, previously seen by orthopedic, neurology. Rx observation for now Osteopenia: will schedule a dexa  Groin pain: Exam negative for hernias, rx observation for now  RTC 4 months

## 2016-07-12 NOTE — Progress Notes (Signed)
Pre visit review using our clinic review tool, if applicable. No additional management support is needed unless otherwise documented below in the visit note. 

## 2016-07-12 NOTE — Patient Instructions (Signed)
GO TO THE LAB : Get the blood work     GO TO THE FRONT DESK Schedule your next appointment for a  routine checkup in 4 months  Change acarbose to 100 mg tablets up to 3 times a day with meals Change metformin to 1000 mg tablets, one twice a day For sleep, go back on Xanax, 1 tablet at bedtime as needed

## 2016-07-13 NOTE — Assessment & Plan Note (Signed)
DM: Last A1c 7.2, will increase metformin to 1000 bid and acarbose to 100 mg tid . Check A1c HTN: Continue Cardizem, last BMP satisfactory Insomnia: currently on no meds, ambien was $$, thus will go back to xanax  Paresthesias: Not a new issue, has complained of paresthesias on and off for few years, previously seen by orthopedic, neurology. Rx observation for now Osteopenia: will schedule a dexa  Groin pain: Exam negative for hernias, rx observation for now  RTC 4 months

## 2016-07-17 ENCOUNTER — Other Ambulatory Visit: Payer: Self-pay | Admitting: Internal Medicine

## 2016-07-20 ENCOUNTER — Other Ambulatory Visit: Payer: Self-pay | Admitting: Internal Medicine

## 2016-08-06 ENCOUNTER — Ambulatory Visit (INDEPENDENT_AMBULATORY_CARE_PROVIDER_SITE_OTHER)
Admission: RE | Admit: 2016-08-06 | Discharge: 2016-08-06 | Disposition: A | Discharge status: Home / Self Care | Payer: Medicare Other | Source: Ambulatory Visit | Attending: Internal Medicine | Admitting: Internal Medicine

## 2016-08-06 ENCOUNTER — Other Ambulatory Visit (HOSPITAL_BASED_OUTPATIENT_CLINIC_OR_DEPARTMENT_OTHER): Payer: Medicare Other

## 2016-08-06 DIAGNOSIS — Z780 Asymptomatic menopausal state: Secondary | ICD-10-CM | POA: Diagnosis not present

## 2016-08-07 ENCOUNTER — Other Ambulatory Visit: Payer: Self-pay | Admitting: Internal Medicine

## 2016-09-14 ENCOUNTER — Encounter: Payer: Self-pay | Admitting: *Deleted

## 2016-09-14 DIAGNOSIS — M19042 Primary osteoarthritis, left hand: Secondary | ICD-10-CM

## 2016-09-14 DIAGNOSIS — M19041 Primary osteoarthritis, right hand: Secondary | ICD-10-CM

## 2016-09-14 DIAGNOSIS — M19071 Primary osteoarthritis, right ankle and foot: Secondary | ICD-10-CM

## 2016-09-14 DIAGNOSIS — M170 Bilateral primary osteoarthritis of knee: Secondary | ICD-10-CM

## 2016-09-14 DIAGNOSIS — M19072 Primary osteoarthritis, left ankle and foot: Secondary | ICD-10-CM

## 2016-09-14 HISTORY — DX: Bilateral primary osteoarthritis of knee: M17.0

## 2016-09-14 HISTORY — DX: Primary osteoarthritis, right hand: M19.042

## 2016-09-14 HISTORY — DX: Primary osteoarthritis, left ankle and foot: M19.071

## 2016-09-14 HISTORY — DX: Primary osteoarthritis, right hand: M19.041

## 2016-09-14 NOTE — Progress Notes (Signed)
*IMAGE* Office Visit Note  Patient: [name] [name]             Date of Birth: 09-10-1946           MRN: [id number]             PCP: Willow Ora, MD Referring: Wanda Plump, MD Visit Date: 09/15/2016 Occupation:@GUAROCC @    Subjective:  Pain of the Left Shoulder; Pain of the Left Knee; Tingling of the Right Hand; Tingling of the Left Hand; and Follow-up Follow-up on osteoarthritis of the knee joint  History of Present Illness: [name] [name] is a 70 y.o. female who was last seen 06/11/2016. On that visit patient was having left knee joint pain. We had recently given her Euflexxa in both knees that was completed 03/19/2016. She returned back May 22 for cortisone injection left knee that was given by Dr. Corliss Skains and then came back August 24 for cortisone injection left knee again. We offered her an unloader brace for the left knee joint but patient declined at that time. At that time we discussed the risk of frequent cortisone injections in the left knee joint.  Today the patient states the left knee is doing well. She rates that pain is 1 on the scale of 0-10.  She is now having left shoulder joint pain. It does hurt her a lot but it does not stop her from doing activities of daily living. She does have to work through the pain. Rates her left shoulder joint pain as a 7 on a scale of 0-10.    Activities of Daily Living:  Patient reports morning stiffness for 15 minutes.   Patient Reports nocturnal pain.  Difficulty dressing/grooming: Reports Difficulty climbing stairs: Reports Difficulty getting out of chair: Reports Difficulty using hands for taps, buttons, cutlery, and/or writing: Reports   Review of Systems  Constitutional: Negative for fatigue.  HENT: Negative for mouth sores and mouth dryness.   Eyes: Negative for dryness.  Respiratory: Negative for shortness of breath.   Gastrointestinal: Negative for constipation and diarrhea.  Musculoskeletal: Positive for arthralgias  (left knee), joint pain (left knee) and morning stiffness. Negative for myalgias and myalgias.  Skin: Negative for sensitivity to sunlight.  Psychiatric/Behavioral: Negative for decreased concentration and sleep disturbance.    PMFS History:  Patient Active Problem List   Diagnosis Date Noted  . Osteoarthritis of both hands 09/14/2016  . Osteoarthritis of both feet 09/14/2016  . Osteoarthritis of both knees 09/14/2016  . PCP NOTES >>> 08/19/2015  . Low back pain 09/09/2014  . Neuralgia of left saphenous nerve 06/05/2014  . Numbness and tingling in left arm 06/05/2014  . Paresthesia of left arm 05/21/2014  . Gastroparesis 02/20/2013  . Insomnia 02/20/2013  . Annual physical exam 03/31/2012  . DIVERTICULITIS, HX OF 04/14/2010  . Osteopenia 05/14/2009  . DYSPHAGIA UNSPECIFIED 01/10/2009  . 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 Hyperlipidemia 06/14/2007  . Essential hypertension 06/14/2007  . Osteoarthritis 06/14/2007  . DM (diabetes mellitus) (HCC) 01/02/2007    Past Medical History:  Diagnosis Date  . Bilateral knee pain    Euflexxa x3  . Chest pain 06/2007   CP and QW on EKG: neg stress test   . Diabetes mellitus    w/ neuropathy  . Diverticulitis 2000  . Hyperlipidemia   . Hypertension   . Insomnia 02/20/2013  . Osteoarthritis    gout, CPPD--- sees Dr.Davenshwar  . Osteoarthritis of both feet 09/14/2016  . Osteoarthritis of both hands  09/14/2016  . Osteoarthritis of both knees 09/14/2016  . Osteopenia    per DEXA 4/10  . Recurrent boils    h/o MRSA    Family History  Problem Relation Age of Onset  . Hypertension Mother   . Diabetes Mother   . Hypertension Father   . Diabetes Father   . Hypertension Daughter   . Hypertension Sister   . Diabetes Sister   . Coronary artery disease Neg Hx   . Stroke Neg Hx   . Colon cancer Neg Hx   . Breast cancer Neg Hx    Past Surgical History:  Procedure Laterality Date  . CHOLECYSTECTOMY    . TONSILLECTOMY     Social  History   Social History Narrative   Lives w/ husband   Patient is married with 3 children.   Patient is right handed.   Patient has college education.   Patient drinks 3-4 daily.     Objective: Vital Signs: BP 122/71 (BP Location: Left Arm, Patient Position: Sitting, Cuff Size: Large)   Pulse 78   Resp 13   Ht 5\' 3"  (1.6 m)    Physical Exam  Constitutional: She is oriented to person, place, and time. She appears well-developed and well-nourished.  HENT:  Head: Normocephalic and atraumatic.  Eyes: EOM are normal. Pupils are equal, round, and reactive to light.  Cardiovascular: Normal rate, regular rhythm and normal heart sounds.  Exam reveals no gallop and no friction rub.   No murmur heard. Pulmonary/Chest: Effort normal and breath sounds normal. She has no wheezes. She has no rales.  Abdominal: Soft. Bowel sounds are normal. She exhibits no distension. There is no tenderness. There is no guarding. No hernia.  Musculoskeletal: Normal range of motion. She exhibits no edema, tenderness or deformity.  Lymphadenopathy:    She has no cervical adenopathy.  Neurological: She is alert and oriented to person, place, and time. Coordination normal.  Skin: Skin is warm and dry. Capillary refill takes less than 2 seconds. No rash noted.  Psychiatric: She has a normal mood and affect. Her behavior is normal.  Nursing note and vitals reviewed.    Musculoskeletal Exam:   Full range of motion of all joints except left shoulder joint has decreased range of motion secondary to pain. This formation is about 60-75% in both hands Fibromyalgia tender points are all absent  CDAI Exam: CDAI Homunculus Exam:   Joint Counts:  CDAI Tender Joint count: 0 CDAI Swollen Joint count: 0     Investigation: No additional findings.   Imaging: No results found.  Speciality Comments: No specialty comments available.    Procedures:  Large Joint Inj Date/Time: 09/15/2016 12:02 PM Performed  by: Tawni PummelPANWALA, Mao Lockner Authorized by: Tawni PummelPANWALA, Tekila Caillouet   Consent Given by:  Patient Site marked: the procedure site was marked   Timeout: prior to procedure the correct patient, procedure, and site was verified   Indications:  Pain Location:  Shoulder Site:  L glenohumeral Prep: patient was prepped and draped in usual sterile fashion   Needle Size:  27 G Needle Length:  1.5 inches Approach:  Posterior Ultrasound Guidance: No   Fluoroscopic Guidance: No   Arthrogram: No Medications:  1 mL lidocaine 1 %; 40 mg triamcinolone acetonide 40 MG/ML Aspiration Attempted: Yes   Aspirate amount (mL):  0 Patient tolerance:  Patient tolerated the procedure well with no immediate complications Comments: After informed consent was obtained, the site was prepped in usual sterile fashion and using the posterior  approach, I injected the left shoulder joint with 40 mg of Kenalog mixed with 1 mL 1% lidocaine without epinephrine. Patient tolerated procedure well. Note patient was 7 on a scale of 0-10 before the injection. I minute after the injection she rates her pain and discomfort as a 2. She has much better range of motion and is happy with the injection that we just gave her. There were no complications.   Allergies: Colcrys [colchicine]; Hydrocodone; Sulindac; Tramadol; Celecoxib; and Glimepiride   Assessment / Plan: Visit Diagnoses: Osteoarthritis of both knees, unspecified osteoarthritis type  Osteoarthritis of both hands, unspecified osteoarthritis type  Osteoarthritis of both feet, unspecified osteoarthritis type  Bursitis of left shoulder   We will apply for Euflex of both knees 3 starting January 2018 She has had Euflex that in the past and completed the series 03/19/2016. She did come back twice after she finished her series for left knee cortisone injection but overall she's done really well after the 2 cortisone injections. In my opinion she may have benefited from the Euflex to separate  flares. Since today she is doing well and she rates her pain as about a 1 on a scale of 0-10 file she would benefit from getting Euflex injections starting 2018. We will see her back when the Euflex the medications are approved and we can go ahead and give her those injections in January    Left shoulder joint pain consistent with bursitis. After informed consent obtained the site was prepped in usual fashion and injected with 40 mg of Kenalog mix with 1 mg percent lidocaine. Patient tolerated procedure well. There are no complications about 2 minutes after the injection patient was having better range of motion of her left shoulder joint. She states that her pain was a 0 on a scale of 0-10 as she was about to leave our office. We will see her back in 5 months for regular follow-up.  Patient is requesting [employer] samples to apply to her left shoulder joint in case she needs it. I gave her 2 boxes which have 4 packets in each. NDC is [id number] 4 [id number] 4 Lot number is [id number] a expiration is August 2018  On July visit I gave her Cymbalta but patient did not get it filled. She has not been taking it therefore and still doing great. As a result I told the patient she does not need to get the Cymbalta at this time. In the future we will consider giving her Cymbalta but at this time she doesn't need it so she should not get it filled.  Orders: Orders Placed This Encounter  Procedures  . Large Joint Injection/Arthrocentesis   No orders of the defined types were placed in this encounter.   Face-to-face time spent with patient was 30 minutes. 50% of time was spent in counseling and coordination of care.  Follow-Up Instructions: Return in about 5 months (around 02/13/2017) for left sj pain, left knee pain, visco both knees.   I examined and evaluated the patient with Tawni PummelNaitik Fidel Caggiano PA. The plan of care was discussed as noted above.  Pollyann SavoyShaili Deveshwar, MD

## 2016-09-15 ENCOUNTER — Encounter: Payer: Self-pay | Admitting: Rheumatology

## 2016-09-15 ENCOUNTER — Ambulatory Visit (INDEPENDENT_AMBULATORY_CARE_PROVIDER_SITE_OTHER): Payer: Medicare Other | Admitting: Rheumatology

## 2016-09-15 VITALS — BP 122/71 | HR 78 | Resp 13 | Ht 63.0 in

## 2016-09-15 DIAGNOSIS — M7552 Bursitis of left shoulder: Secondary | ICD-10-CM

## 2016-09-15 DIAGNOSIS — M19041 Primary osteoarthritis, right hand: Secondary | ICD-10-CM | POA: Diagnosis not present

## 2016-09-15 DIAGNOSIS — M19072 Primary osteoarthritis, left ankle and foot: Secondary | ICD-10-CM

## 2016-09-15 DIAGNOSIS — M19071 Primary osteoarthritis, right ankle and foot: Secondary | ICD-10-CM | POA: Diagnosis not present

## 2016-09-15 DIAGNOSIS — M170 Bilateral primary osteoarthritis of knee: Secondary | ICD-10-CM | POA: Diagnosis not present

## 2016-09-15 DIAGNOSIS — M19042 Primary osteoarthritis, left hand: Secondary | ICD-10-CM | POA: Diagnosis not present

## 2016-09-15 MED ORDER — LIDOCAINE HCL 1 % IJ SOLN
1.0000 mL | INTRAMUSCULAR | Status: AC | PRN
  Administered 2016-09-15: 1 mL

## 2016-09-15 MED ORDER — TRIAMCINOLONE ACETONIDE 40 MG/ML IJ SUSP
40.0000 mg | INTRAMUSCULAR | Status: AC | PRN
  Administered 2016-09-15: 40 mg via INTRA_ARTICULAR

## 2016-11-03 ENCOUNTER — Other Ambulatory Visit: Payer: Self-pay | Admitting: Internal Medicine

## 2016-11-12 ENCOUNTER — Ambulatory Visit: Payer: Medicare Other | Admitting: Internal Medicine

## 2016-11-22 ENCOUNTER — Ambulatory Visit: Payer: Medicare Other | Admitting: Internal Medicine

## 2016-12-03 ENCOUNTER — Ambulatory Visit: Payer: Medicare Other | Admitting: Internal Medicine

## 2016-12-08 DIAGNOSIS — Z8679 Personal history of other diseases of the circulatory system: Secondary | ICD-10-CM | POA: Insufficient documentation

## 2016-12-08 DIAGNOSIS — Z8639 Personal history of other endocrine, nutritional and metabolic disease: Secondary | ICD-10-CM | POA: Insufficient documentation

## 2016-12-08 DIAGNOSIS — Z8739 Personal history of other diseases of the musculoskeletal system and connective tissue: Secondary | ICD-10-CM | POA: Insufficient documentation

## 2016-12-08 DIAGNOSIS — E785 Hyperlipidemia, unspecified: Secondary | ICD-10-CM | POA: Insufficient documentation

## 2016-12-08 NOTE — Progress Notes (Signed)
Office Visit Note  Patient: [name] [name]             Date of Birth: Feb 23, 1946           MRN: [id number]             PCP: Willow Ora, MD Referring: Wanda Plump, MD Visit Date: 12/09/2016 Occupation: @GUAROCC @    Subjective:  Right knee pain   History of Present Illness: [name] [name] is a 71 y.o. female with history of osteoarthritis and pseudogout. According to patient last week her right knee started hurting. His been painful and swollen and now she's having difficulty walking. None of the other joints are painful. Hands and feet are stiff but  pain is tolerable.   Activities of Daily Living:  Patient reports morning stiffness for all day hours.   Patient Reports nocturnal pain.  Difficulty dressing/grooming: Reports Difficulty climbing stairs: Reports Difficulty getting out of chair: Reports Difficulty using hands for taps, buttons, cutlery, and/or writing: Denies   Review of Systems  Constitutional: Negative for fatigue, night sweats, weight gain, weight loss and weakness.  HENT: Negative for mouth sores, trouble swallowing, trouble swallowing, mouth dryness and nose dryness.   Eyes: Negative for pain, redness, visual disturbance and dryness.  Respiratory: Negative for cough, shortness of breath and difficulty breathing.   Cardiovascular: Negative for chest pain, palpitations, hypertension, irregular heartbeat and swelling in legs/feet.  Gastrointestinal: Negative for blood in stool, constipation and diarrhea.  Endocrine: Negative for increased urination.  Genitourinary: Negative for vaginal dryness.  Musculoskeletal: Positive for arthralgias, joint pain, joint swelling and morning stiffness. Negative for myalgias, muscle weakness, muscle tenderness and myalgias.  Skin: Negative for color change, rash, hair loss, skin tightness, ulcers and sensitivity to sunlight.  Allergic/Immunologic: Negative for susceptible to infections.  Neurological: Negative for dizziness,  memory loss and night sweats.  Hematological: Negative for swollen glands.  Psychiatric/Behavioral: Positive for sleep disturbance. Negative for depressed mood. The patient is not nervous/anxious.     PMFS History:  Patient Active Problem List   Diagnosis Date Noted  . 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 History of hypertension 12/08/2016  . History of diabetes mellitus 12/08/2016  . Dyslipidemia 12/08/2016  . Osteoarthritis of both hands 09/14/2016  . Osteoarthritis of both feet 09/14/2016  . Osteoarthritis of both knees 09/14/2016  . PCP NOTES >>> 08/19/2015  . Low back pain 09/09/2014  . Neuralgia of left saphenous nerve 06/05/2014  . Numbness and tingling in left arm 06/05/2014  . Paresthesia of left arm 05/21/2014  . Gastroparesis 02/20/2013  . Insomnia 02/20/2013  . Annual physical exam 03/31/2012  . DIVERTICULITIS, HX OF 04/14/2010  . Osteopenia 05/14/2009  . DYSPHAGIA UNSPECIFIED 01/10/2009  . 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 Hyperlipidemia 06/14/2007  . Essential hypertension 06/14/2007  . Osteoarthritis 06/14/2007  . DM (diabetes mellitus) (HCC) 01/02/2007    Past Medical History:  Diagnosis Date  . Bilateral knee pain    Euflexxa x3  . Chest pain 06/2007   CP and QW on EKG: neg stress test   . Diabetes mellitus    w/ neuropathy  . Diverticulitis 2000  . Hyperlipidemia   . Hypertension   . Insomnia 02/20/2013  . Osteoarthritis    gout, CPPD--- sees Dr.Davenshwar  . Osteoarthritis of both feet 09/14/2016  . Osteoarthritis of both hands 09/14/2016  . Osteoarthritis of both knees 09/14/2016  . Osteopenia    per DEXA 4/10  . Recurrent boils  h/o MRSA    Family History  Problem Relation Age of Onset  . Hypertension Mother   . Diabetes Mother   . Hypertension Father   . Diabetes Father   . Hypertension Daughter   . Hypertension Sister   . Diabetes Sister   . Coronary artery disease Neg Hx   . Stroke Neg Hx   . Colon cancer Neg Hx   . Breast cancer Neg  Hx    Past Surgical History:  Procedure Laterality Date  . CHOLECYSTECTOMY    . TONSILLECTOMY     Social History   Social History Narrative   Lives w/ husband   Patient is married with 3 children.   Patient is right handed.   Patient has college education.   Patient drinks 3-4 daily.     Objective: Vital Signs: BP 135/78 (BP Location: Left Arm, Patient Position: Sitting, Cuff Size: Normal)   Pulse (!) 108   Resp 16   Ht 5\' 3"  (1.6 m)    Physical Exam  Constitutional: She is oriented to person, place, and time. She appears well-developed and well-nourished.  HENT:  Head: Normocephalic and atraumatic.  Eyes: Conjunctivae and EOM are normal.  Neck: Normal range of motion.  Cardiovascular: Normal rate, regular rhythm, normal heart sounds and intact distal pulses.   Pulmonary/Chest: Effort normal and breath sounds normal.  Abdominal: Soft. Bowel sounds are normal.  Lymphadenopathy:    She has no cervical adenopathy.  Neurological: She is alert and oriented to person, place, and time.  Skin: Skin is warm and dry. Capillary refill takes less than 2 seconds.  Psychiatric: She has a normal mood and affect. Her behavior is normal.  Nursing note and vitals reviewed.    Musculoskeletal Exam: C-spine and thoracic lumbar spine good range of motion. She has  good range of motion of her shoulders, elbows, wrist joints, some thickening of PIP/DIP joints in her hands with no synovitis. Hip joints are good range of motion. Her right knee junk joint was warm,swollen with effusion read left knee joint was good range of motion with no warmth or swelling. She has some osteoarthritic changes in her feet with PIP/DIP thickening.  CDAI Exam: CDAI Homunculus Exam:   Joint Counts:  CDAI Tender Joint count: 0 CDAI Swollen Joint count: 0     Investigation: Findings:  10/08/2014 X-rays were reviewed of the right knee joint taken on August 22, 2014.  The left knee joint was reviewed also,  which was taken on November 12, 2013, and shows severe medial compartment narrowing.    Imaging: No results found.  Speciality Comments: No specialty comments available.    Procedures:  Large Joint Inj Date/Time: 12/09/2016 9:13 AM Performed by: Pollyann Savoy Authorized by: Pollyann Savoy   Consent Given by:  Patient Site marked: the procedure site was marked   Timeout: prior to procedure the correct patient, procedure, and site was verified   Indications:  Pain and joint swelling Location:  Knee Site:  R knee Prep: patient was prepped and draped in usual sterile fashion   Needle Size:  25 G Needle Length:  1.5 inches Approach:  Medial Ultrasound Guidance: No   Fluoroscopic Guidance: No   Arthrogram: No   Medications:  1.5 mL lidocaine 1 %; 40 mg triamcinolone acetonide 40 MG/ML; 2 mL lidocaine 1 % Aspiration Attempted: Yes   Aspirate amount (mL):  33 Lab: fluid sent for laboratory analysis   Patient tolerance:  Patient tolerated the procedure well with no  immediate complications   Allergies: Colcrys [colchicine]; Hydrocodone; Sulindac; Tramadol; Celecoxib; and Glimepiride   Assessment / Plan:     Visit Diagnoses: Effusion, right knee -she has severe pain in her right knee joint with difficulty walking. Different treatment options and side effects were discussed. After informed consent was obtained right knee joint was aspirated as described above. The synovial fluid was sent for following studies. She tolerated the procedure well. Plan: Synovial cell count + diff, w/ crystals, Body fluid culture, CBC with Differential/Platelet, COMPLETE METABOLIC PANEL WITH GFR, Uric acid, Magnesium  Pseudogout  - she's not taking any medications currently. We have discussed colchicine in the past. I've given her prescription for colchicine today which can be taken on when necessary basis only she's also on a statin drugs. She's had problems with nausea and the past. We'll see how she  tolerates 1 tablet a day. Chondrocalcinosis and hands and knee joints  Primary osteoarthritis of both hands will and muscle strengthening and joint protection discussed.  Primary osteoarthritis of both knees: Muscle strengthening discussed.  Primary osteoarthritis of both feet: Proper fitting shoes discussed.  History of hypertension : Her blood pressure was normal today.  History of diabetes mellitus and she's been advised to monitor blood sugar closely  Dyslipidemia    Orders: Orders Placed This Encounter  Procedures  . Large Joint Injection/Arthrocentesis  . Body fluid culture  . Synovial cell count + diff, w/ crystals  . CBC with Differential/Platelet  . COMPLETE METABOLIC PANEL WITH GFR  . Uric acid  . Magnesium   Meds ordered this encounter  Medications  . colchicine 0.6 MG tablet    Sig: 1 tablet by mouth daily when necessary    Dispense:  30 tablet    Refill:  2    Face-to-face time spent with patient was 35 minutes. 50% of time was spent in counseling and coordination of care.  Follow-Up Instructions: Return in about 6 months (around 06/08/2017) for Osteoarthritis.   Pollyann SavoyShaili Avelino Herren, MD  Note - This record has been created using [employer] software.  Chart creation errors have been sought, but may not always  have been located. Such creation errors do not reflect on  the standard of medical care.

## 2016-12-09 ENCOUNTER — Encounter: Payer: Self-pay | Admitting: Rheumatology

## 2016-12-09 ENCOUNTER — Ambulatory Visit (INDEPENDENT_AMBULATORY_CARE_PROVIDER_SITE_OTHER): Payer: Medicare Other | Admitting: Rheumatology

## 2016-12-09 VITALS — BP 135/78 | HR 108 | Resp 16 | Ht 63.0 in | Wt 138.0 lb

## 2016-12-09 DIAGNOSIS — M19071 Primary osteoarthritis, right ankle and foot: Secondary | ICD-10-CM

## 2016-12-09 DIAGNOSIS — Z8639 Personal history of other endocrine, nutritional and metabolic disease: Secondary | ICD-10-CM

## 2016-12-09 DIAGNOSIS — M25461 Effusion, right knee: Secondary | ICD-10-CM | POA: Diagnosis not present

## 2016-12-09 DIAGNOSIS — Z8679 Personal history of other diseases of the circulatory system: Secondary | ICD-10-CM | POA: Diagnosis not present

## 2016-12-09 DIAGNOSIS — E785 Hyperlipidemia, unspecified: Secondary | ICD-10-CM | POA: Diagnosis not present

## 2016-12-09 DIAGNOSIS — M1120 Other chondrocalcinosis, unspecified site: Secondary | ICD-10-CM

## 2016-12-09 DIAGNOSIS — M170 Bilateral primary osteoarthritis of knee: Secondary | ICD-10-CM | POA: Diagnosis not present

## 2016-12-09 DIAGNOSIS — M19041 Primary osteoarthritis, right hand: Secondary | ICD-10-CM

## 2016-12-09 DIAGNOSIS — M19072 Primary osteoarthritis, left ankle and foot: Secondary | ICD-10-CM | POA: Diagnosis not present

## 2016-12-09 DIAGNOSIS — M19042 Primary osteoarthritis, left hand: Secondary | ICD-10-CM | POA: Diagnosis not present

## 2016-12-09 LAB — CBC WITH DIFFERENTIAL/PLATELET
BASOS PCT: 0 %
Basophils Absolute: 0 cells/uL (ref 0–200)
Eosinophils Absolute: 0 cells/uL — ABNORMAL LOW (ref 15–500)
Eosinophils Relative: 0 %
HCT: 40.2 % (ref 35.0–45.0)
Hemoglobin: 13.4 g/dL (ref 11.7–15.5)
Lymphocytes Relative: 19 %
Lymphs Abs: 1368 cells/uL (ref 850–3900)
MCH: 29.3 pg (ref 27.0–33.0)
MCHC: 33.3 g/dL (ref 32.0–36.0)
MCV: 87.8 fL (ref 80.0–100.0)
MONOS PCT: 8 %
MPV: 10.2 fL (ref 7.5–12.5)
Monocytes Absolute: 576 cells/uL (ref 200–950)
NEUTROS ABS: 5256 {cells}/uL (ref 1500–7800)
Neutrophils Relative %: 73 %
PLATELETS: 304 10*3/uL (ref 140–400)
RBC: 4.58 MIL/uL (ref 3.80–5.10)
RDW: 14.7 % (ref 11.0–15.0)
WBC: 7.2 10*3/uL (ref 3.8–10.8)

## 2016-12-09 MED ORDER — LIDOCAINE HCL 1 % IJ SOLN
1.5000 mL | INTRAMUSCULAR | Status: AC | PRN
  Administered 2016-12-09: 1.5 mL

## 2016-12-09 MED ORDER — LIDOCAINE HCL 1 % IJ SOLN
2.0000 mL | INTRAMUSCULAR | Status: AC | PRN
  Administered 2016-12-09: 2 mL

## 2016-12-09 MED ORDER — COLCHICINE 0.6 MG PO TABS: ORAL_TABLET | ORAL | 2 refills | Status: DC

## 2016-12-09 MED ORDER — TRIAMCINOLONE ACETONIDE 40 MG/ML IJ SUSP
40.0000 mg | INTRAMUSCULAR | Status: AC | PRN
  Administered 2016-12-09: 40 mg via INTRA_ARTICULAR

## 2016-12-09 NOTE — Progress Notes (Signed)
Patient was prescribed colchicine during today's visit.  Counseled patient on purpose, proper use, and adverse effects of colchicine including diarrhea, nausea/vomiting, and muscle pain or weakness.  Patient was advised to take colchicne 0.6 mg daily PRN flare.  Noted patient has history of nausea with colchicine.  Patient reports she is willing to retry the medication at this time.  Reviewed with patient that she should not take the medication more than once daily due to the drug interaction with diltiazem.  Also discussed the risk of muscle pain and/or weakness with colchicine and pravastatin.  Advised patient to let us know if she experiences muscle pain or weakness.  Patient voiced understanding and denies any questions regarding the medication.    [name], Pharm.D., BCPS, CPP Clinical Pharmacist Pager: [phone] Phone: [phone] 12/09/2016 9:20 AM

## 2016-12-09 NOTE — Patient Instructions (Signed)
Colchicine tablets or capsules What is this medicine? COLCHICINE (KOL chi seen) is for joint pain and swelling due to attacks of acute gouty arthritis. The medicine is also used to treat familial Mediterranean fever. This medicine may be used for other purposes; ask your health care provider or pharmacist if you have questions. COMMON BRAND NAME(S): Colcrys, MITIGARE What should I tell my health care provider before I take this medicine? They need to know if you have any of these conditions: -anemia -blood disorders like leukemia or lymphoma -heart disease -immune system problems -intestinal disease -kidney disease -liver disease -muscle pain or weakness -take other medicines -stomach problems -an unusual or allergic reaction to colchicine, other medicines, lactose, foods, dyes, or preservatives -pregnant or trying to get pregnant -breast-feeding How should I use this medicine? Take this medicine by mouth with a full glass of water. Follow the directions on the prescription label. You can take it with or without food. If it upsets your stomach, take it with food. Take your medicine at regular intervals. Do not take your medicine more often than directed. A special MedGuide will be given to you by the pharmacist with each prescription and refill. Be sure to read this information carefully each time. Talk to your pediatrician regarding the use of this medicine in children. While this drug may be prescribed for children as young as 38 years old for selected conditions, precautions do apply. Patients over 50 years old may have a stronger reaction and need a smaller dose. Overdosage: If you think you have taken too much of this medicine contact a poison control center or emergency room at once. NOTE: This medicine is only for you. Do not share this medicine with others. What if I miss a dose? If you miss a dose, take it as soon as you can. If it is almost time for your next dose, take only that  dose. Do not take double or extra doses. What may interact with this medicine? Do not take this medicine with any of the following medications: -certain medicines for fungal infections like itraconazole This medicine may also interact with the following medications: -alcohol -certain medicines for cholesterol like atorvastatin -certain medicines for coughs and colds -certain medicines to help you breathe better -cyclosporine -digoxin -epinephrine -grapefruit or grapefruit juice -methenamine -other medicines for fungal infection -sodium bicarbonate -some antibiotics like clarithromycin, erythromycin, and telithromycin -some medicines for an irregular heartbeat or other heart problems -some medicines for cancer, like lapatinib and tamoxifen -some medicines for HIV This list may not describe all possible interactions. Give your health care provider a list of all the medicines, herbs, non-prescription drugs, or dietary supplements you use. Also tell them if you smoke, drink alcohol, or use illegal drugs. Some items may interact with your medicine. What should I watch for while using this medicine? Visit your doctor or health care professional for regular checks on your progress. You may need periodic blood checks. Alcohol can increase the chance of getting stomach problems and gout attacks. Do not drink alcohol. What side effects may I notice from receiving this medicine? Side effects that you should report to your doctor or health care professional as soon as possible: -allergic reactions like skin rash, itching or hives, swelling of the face, lips, or tongue -fever, chills, or sore throat -muscle tenderness, pain, or weakness -numbness or tingling in hands or feet -unusual bleeding or bruising -unusually weak or tired -vomiting Side effects that usually do not require medical attention (report to  your doctor or health care professional if they continue or are  bothersome): -diarrhea -hair loss -loss of appetite -stomach pain or nausea This list may not describe all possible side effects. Call your doctor for medical advice about side effects. You may report side effects to FDA at 1-800-FDA-1088. Where should I keep my medicine? Keep out of the reach of children. Store at room temperature between 15 and 30 degrees C (59 and 86 degrees F). Keep container tightly closed. Protect from light. Throw away any unused medicine after the expiration date. NOTE: This sheet is a summary. It may not cover all possible information. If you have questions about this medicine, talk to your doctor, pharmacist, or health care provider.  2017 Elsevier/Gold Standard (2013-04-30 16:48:38)  

## 2016-12-10 LAB — COMPLETE METABOLIC PANEL WITH GFR
ALT: 12 U/L (ref 6–29)
AST: 11 U/L (ref 10–35)
Albumin: 3.8 g/dL (ref 3.6–5.1)
Alkaline Phosphatase: 73 U/L (ref 33–130)
BUN: 10 mg/dL (ref 7–25)
CHLORIDE: 102 mmol/L (ref 98–110)
CO2: 22 mmol/L (ref 20–31)
Calcium: 9.7 mg/dL (ref 8.6–10.4)
Creat: 0.63 mg/dL (ref 0.50–0.99)
GLUCOSE: 195 mg/dL — AB (ref 65–99)
POTASSIUM: 4.2 mmol/L (ref 3.5–5.3)
SODIUM: 138 mmol/L (ref 135–146)
TOTAL PROTEIN: 6.7 g/dL (ref 6.1–8.1)
Total Bilirubin: 0.5 mg/dL (ref 0.2–1.2)

## 2016-12-10 LAB — SYNOVIAL CELL COUNT + DIFF, W/ CRYSTALS
BASOPHILS, %: 0 %
EOSINOPHILS-SYNOVIAL: 0 % (ref 0–2)
Lymphocytes-Synovial Fld: 4 % (ref 0–74)
Monocyte/Macrophage: 13 % (ref 0–69)
Neutrophil, Synovial: 83 % — ABNORMAL HIGH (ref 0–24)
SYNOVIOCYTES, %: 0 % (ref 0–15)
WBC, SYNOVIAL: 24575 {cells}/uL — AB (ref <150)

## 2016-12-10 LAB — URIC ACID: Uric Acid, Serum: 6.3 mg/dL (ref 2.5–7.0)

## 2016-12-10 LAB — MAGNESIUM: MAGNESIUM: 1.5 mg/dL (ref 1.5–2.5)

## 2016-12-10 NOTE — Progress Notes (Signed)
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 She was given a prescription of colchicine which she should take as advised.

## 2016-12-13 LAB — BODY FLUID CULTURE
GRAM STAIN: NONE SEEN
ORGANISM ID, BACTERIA: NO GROWTH

## 2016-12-20 ENCOUNTER — Telehealth: Payer: Self-pay | Admitting: Rheumatology

## 2016-12-20 NOTE — Telephone Encounter (Signed)
Patient states she was in a few weeks ago and had fluid drawn off of her knee and she is still in quite a bit of pain. Patient would like to know what else she can do for the pain.

## 2016-12-20 NOTE — Telephone Encounter (Signed)
Patient states she only had 2-3 days. Patient states she is not "hopping around" because of the pain.  Patient states she was advised by Dr. Corliss Skainseveshwar to get some Colchicine for the pain but she is unable to take that due to being allergic to it. Patient states her allergic reaction is a rash and hallucinations. Patient states she has tried aspirin and it helped some with the pain. She would like to know what else she can do for the pain.

## 2016-12-20 NOTE — Telephone Encounter (Signed)
What does it mean"  she only had 2-3 days?"4  If she had allergic rxn to colchicine, she should stop it.  For the flare, I can offer prednisone taper. Is tthat agreeable?

## 2016-12-21 ENCOUNTER — Telehealth: Payer: Self-pay | Admitting: Rheumatology

## 2016-12-21 MED ORDER — PREDNISONE 5 MG PO TABS: ORAL_TABLET | ORAL | 0 refills | Status: DC

## 2016-12-21 NOTE — Addendum Note (Signed)
Addended by: Henriette CombsHATTON, Cayle Cordoba L on: 12/21/2016 01:59 PM   Modules accepted: Orders

## 2016-12-21 NOTE — Telephone Encounter (Signed)
Patient advised prescription for prednisone has been sent to her pharmacy. Patient states she is diabetic. Patient advise to keep an eye on her blood sugars and to talk with her PCP about making adjustments to medications for her diabetes as needed while on the prednisone. Patient verbalized understanding.

## 2016-12-21 NOTE — Telephone Encounter (Signed)
Patient states she was returning a call to the office about her knee pain. Please advise.

## 2016-12-21 NOTE — Telephone Encounter (Signed)
Prescription has been sent to the pharmacy. Patient advised.  

## 2016-12-21 NOTE — Telephone Encounter (Signed)
She only had 2-3 days of relief after her appointment on 12-09-16 in which she received an injection.

## 2016-12-21 NOTE — Telephone Encounter (Signed)
I called patient. She did not pick up the phone but I was able to leave a message on her answering machine.I discussed all the things that you and I discussed in detail with the patient via her voicemail.I told her we'll call in the following medication and if she wants to use that she can pick it up at her pharmacy start using it tomorrow.  OK to Prescribe  Prednisone 5mg :  4po qAM x 4 days,  3po qAM x 4 days, 2po qAM x 4 days,  1po qAM x 4 days, 1/2po qAM x 4 days, then stop.; disp 42 pills w/ no refills.  Take until all gone and exactly as Rx'd. If diabetic, watch sugar levels and have pcp make adjustments for better control if needed.

## 2017-01-03 ENCOUNTER — Telehealth: Payer: Self-pay | Admitting: Rheumatology

## 2017-01-03 NOTE — Telephone Encounter (Signed)
Patient called stating she is still having knee pain and is wondering if she could come by and getting the cream that Mr. Leane Callanwala puts on her knee (she could not think of the name).  UJ#[phone]#[phone].

## 2017-01-03 NOTE — Telephone Encounter (Signed)
Patient stopped by the office and has been given samples.   Medication Samples have been provided to the patient.  Drug name: Pennsaid      Strength: 2%       Qty: 3 LOT: [id number]  Exp.Date: 07/2017  Dosing instructions: Apply contents of one packet to affected knee two times daily.   The patient has been instructed regarding the correct time, dose, and frequency of taking this medication, including desired effects and most common side effects.   Dulcy Fannyndrea Kal Chait 3:22 PM 01/03/2017

## 2017-01-04 ENCOUNTER — Ambulatory Visit (INDEPENDENT_AMBULATORY_CARE_PROVIDER_SITE_OTHER): Payer: Medicare Other | Admitting: Podiatry

## 2017-01-04 ENCOUNTER — Encounter: Payer: Self-pay | Admitting: Podiatry

## 2017-01-04 ENCOUNTER — Telehealth: Payer: Self-pay | Admitting: Pharmacist

## 2017-01-04 VITALS — BP 148/89 | HR 89 | Resp 14

## 2017-01-04 DIAGNOSIS — B351 Tinea unguium: Secondary | ICD-10-CM

## 2017-01-04 DIAGNOSIS — E119 Type 2 diabetes mellitus without complications: Secondary | ICD-10-CM | POA: Diagnosis not present

## 2017-01-04 NOTE — Progress Notes (Signed)
Patient ID: [name] SearingBetty M Tani, female   DOB: 11/11/1946, 71 y.o.   MRN: [id number]   This patient presents today stating that her toenails are elongated and thickened with color changes and have gradually become more difficult for patient to trim in the past several months. Patient denies any recent podiatric care for this problem. Patient is a diabetic and denies any history of ulceration, claudication or amputation  Objective:Orientated 3  Vascular: DP pulses 2/4 bilaterally PT pulses 2/4 bilaterally Capillary reflex immediate bilaterally  Neurological: Sensation to 10 g monofilament wire intact 5/5 bilaterally  Vibratory sensation intact bilaterally Ankle reflex equal and reactive bilaterally  Dermatological: No open skin lesions bilaterally The toenails are elongated incurvated discolored 6-10  Musculoskeletal: Pes planus bilaterally HAV bilaterally Hammertoe second left There is no restriction ankle, subtalar, midtarsal joints bilaterally Prominence dorsal midfoot bony areas palpated bilaterally  Assessment: Satisfactory neurovascular status Diabetic without foot complications Mycotic toenails 6-10  Plan: Debridement of toenails 6-10 mechanically and legs without any bleeding  Recommended reappoint at 3 months, however, patient requested to contact the office rather than schedule

## 2017-01-04 NOTE — Telephone Encounter (Signed)
Received fax from [facility] regarding drug-drug interaction between colchicine and diltiazem.  Patient was prescribed colchicine at office visit on 12/09/16.  She was counseled on the interaction with diltiazem at that time and was advised to only take the medication as needed and not to take more than once daily due to the interaction.    I called patient to follow up.  She reports she did not start taking the colchicine due to concern of previous allergy to the medication.  Patient reports she did not pick up the prescription from the pharmacy and is not taking the medication.  Patient denies any questions regarding her medications at this time.    [name], Pharm.D., BCPS, CPP Clinical Pharmacist Pager: [phone] Phone: [phone] 01/04/2017 3:15 PM

## 2017-01-04 NOTE — Patient Instructions (Signed)

## 2017-01-12 ENCOUNTER — Telehealth: Payer: Self-pay | Admitting: Rheumatology

## 2017-01-12 NOTE — Telephone Encounter (Signed)
-----   Message from Audrie LiaSharon H Caudle, RT sent at 01/11/2017  9:42 AM EST ----- Regarding: FW: Apply for euflexxa both knees x 3; had good results Mar 19, 2016 Please call patient and schedule Euflexxa inj. X 3 with Hans on Monday, Wednesday or Friday afternoons, patient purchased, bilateral. Euflexxa to be delivered to office by [facility] on 2/28/218 [phone].  Thank you.   ----- Message ----- From: Tawni PummelNaitik Panwala, PA-C Sent: 09/15/2016  12:14 PM To: Tawni PummelNaitik Panwala, PA-C, Audrie LiaSharon H Caudle, RT Subject: Apply for euflexxa both knees x 3; had good #  Apply for euflexxa both knees x 3; had good results Mar 19, 2016 Series.  Call pt and update that we applied.  Thank you.

## 2017-01-12 NOTE — Telephone Encounter (Signed)
Left message on machine for patient to call back to schedule Euflexxa injections.  

## 2017-01-14 ENCOUNTER — Encounter: Payer: Self-pay | Admitting: Internal Medicine

## 2017-01-14 ENCOUNTER — Ambulatory Visit (INDEPENDENT_AMBULATORY_CARE_PROVIDER_SITE_OTHER): Payer: Medicare Other | Admitting: Internal Medicine

## 2017-01-14 VITALS — BP 124/72 | HR 93 | Temp 97.7°F | Resp 12 | Ht 63.0 in | Wt 137.0 lb

## 2017-01-14 DIAGNOSIS — G4700 Insomnia, unspecified: Secondary | ICD-10-CM

## 2017-01-14 DIAGNOSIS — Z1159 Encounter for screening for other viral diseases: Secondary | ICD-10-CM

## 2017-01-14 DIAGNOSIS — E119 Type 2 diabetes mellitus without complications: Secondary | ICD-10-CM | POA: Diagnosis not present

## 2017-01-14 LAB — URINALYSIS, ROUTINE W REFLEX MICROSCOPIC
Bilirubin Urine: NEGATIVE
Hgb urine dipstick: NEGATIVE
Nitrite: NEGATIVE
Protein, ur: NEGATIVE
Specific Gravity, Urine: 1.023 (ref 1.001–1.035)
pH: 5 (ref 5.0–8.0)

## 2017-01-14 NOTE — Progress Notes (Signed)
Pre visit review using our clinic review tool, if applicable. No additional management support is needed unless otherwise documented below in the visit note. 

## 2017-01-14 NOTE — Progress Notes (Signed)
Subjective:    Patient ID: [name] [name], female    DOB: 05/01/1946, 71 y.o.   MRN: [id number]  DOS:  01/14/2017 Type of visit - description : rov Interval history: Her main concern today is diabetes, blood sugar used to run in the 160s, now is consistently more than 200 in the mornings for the last 3-4 weeks. Also is having problems with R knee, saw rheumatology, 12/09/2016, they draw fluid from the right knee, culture was negative. She was prescribed prednisone 12/21/2016. Insomnia: Not taking Xanax, doing okay without it.  Review of Systems Denies any dysuria, gross hematuria or difficulty urinating. No blurred vision. Diet and daily life activity have not changed  Past Medical History:  Diagnosis Date  . Bilateral knee pain    Euflexxa x3  . Chest pain 06/2007   CP and QW on EKG: neg stress test   . Diabetes mellitus    w/ neuropathy  . Diverticulitis 2000  . Hyperlipidemia   . Hypertension   . Insomnia 02/20/2013  . Osteoarthritis    gout, CPPD--- sees Dr.Davenshwar  . Osteoarthritis of both feet 09/14/2016  . Osteoarthritis of both hands 09/14/2016  . Osteoarthritis of both knees 09/14/2016  . Osteopenia    per DEXA 4/10  . Recurrent boils    h/o MRSA    Past Surgical History:  Procedure Laterality Date  . CHOLECYSTECTOMY    . TONSILLECTOMY      Social History   Social History  . Marital status: Married    Spouse name: herbert  . Number of children: 3  . Years of education: college   Occupational History  . 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 Smoking status: Never Smoker  . Smokeless tobacco: Never Used  . Alcohol use No  . Drug use: No  . Sexual activity: Not Currently   Other Topics Concern  . Not on file   Social History Narrative   Lives w/ husband   Patient is married with 3 children.   Patient is right handed.   Patient has  college education.   Patient drinks 3-4 daily.      Allergies as of 01/14/2017      Reactions   Colcrys [colchicine] Nausea Only   Hydrocodone    Sulindac    Tramadol    "Made me feel like I was in another world."   Celecoxib Rash   Glimepiride Rash      Medication List       Accurate as of 01/14/17 11:59 PM. Always use your most recent med list.          acarbose 100 MG tablet Commonly known as:  PRECOSE Take 1 tablet (100 mg total) by mouth 3 (three) times daily with meals.   aspirin 81 MG tablet Take 81 mg by mouth daily.   CLARITIN PO Take 10 mg by mouth daily as needed.   colchicine 0.6 MG tablet 1 tablet by mouth daily when necessary   diclofenac sodium 1 % Gel Commonly known as:  VOLTAREN Apply topically 4 (four) times daily.   PENNSAID 1.5 % Soln Generic drug:  Diclofenac Sodium Place onto the skin.   diltiazem 300 MG 24 hr capsule Commonly known as:  CARDIZEM CD Take 1 capsule (300 mg total) by mouth daily.   fluticasone 50 MCG/ACT nasal spray  Commonly known as:  FLONASE Place 2 sprays into both nostrils daily.   metFORMIN 1000 MG tablet Commonly known as:  GLUCOPHAGE Take 1 tablet (1,000 mg total) by mouth 2 (two) times daily with a meal.   metoCLOPramide 10 MG tablet Commonly known as:  REGLAN Take 10 mg by mouth 4 (four) times daily.   OMEGA 3 PO Take 1 tablet by mouth daily. Reported on 11/18/2015   ONE TOUCH ULTRA TEST test strip Generic drug:  glucose blood CHECK YOUR BLOOD SUGAR 1-2 TIMES DAILY.   pravastatin 20 MG tablet Commonly known as:  PRAVACHOL Take 1 tablet (20 mg total) by mouth at bedtime.   TURMERIC PO Take 1 tablet by mouth daily. OTC   Vitamin D 2000 units Caps Take 2,000 Units by mouth daily. Reported on 11/18/2015   zolpidem 10 MG tablet Commonly known as:  AMBIEN Take 10 mg by mouth at bedtime as needed for sleep.          Objective:   Physical Exam BP 124/72 (BP Location: Left Arm, Patient Position:  Sitting, Cuff Size: Small)   Pulse 93   Temp 97.7 F (36.5 C) (Oral)   Resp 12   Ht 5\' 3"  (1.6 m)   Wt 137 lb (62.1 kg)   SpO2 97%   BMI 24.27 kg/m  General:   Well developed, well nourished . NAD.  HEENT:  Normocephalic . Face symmetric, atraumatic Lungs:  CTA B Normal respiratory effort, no intercostal retractions, no accessory muscle use. Heart: RRR,  no murmur.  no pretibial edema bilaterally  Abdomen:  Not distended, soft, non-tender. No rebound or rigidity.  MSK: Right knee without soft swelling, no actual effusion, slightly warm but not red. Skin: Not pale. Not jaundice Neurologic:  alert & oriented X3.  Speech normal, gait limited by knee pain Psych--  Cognition and judgment appear intact.  Cooperative with normal attention span and concentration.  Behavior appropriate. No anxious or depressed appearing.    Assessment & Plan:   Assessment > DM   Self d/c glimepiride ~01/2015  (?s/e), unwilling to take Januvia d/t "bad reports", other DPP4s $$. Actos intolerant 07-2015, started acarbose 08-2015 Neuropathy? h/o  paresthesias, s/p eval orthopedic surgery and neurology 2015,  NCS 03-2014 no neuropathy ; B12 wnl 2015 HTN Hyperlipidemia Insomnia - Ambien was okay but $!; on xanax prn MSK: Dr Victory Dakin --DJD --Gout --CPPD Osteopenia:  t score 2010 : -1.1, 03-2012: -1.3 GI: Sees DR Madilyn Fireman ---Gastroparesis (+ gastric empty study 2012) - reglan prn ---H/o diverticulitis H/o recurrent boils (MRSA) H/o CP: 2008 negative stress test  PLAN DM: Good compliance with acarbose-metformin. Last A1c 7.0. Diet and exercise unchanged. CBGs increased, probably related to prednisone taper. Will check A1c, micro. Will also check a UA, urine culture to r/o infex.    Consider adjust medication depending on A1c and CBG levels in next few weeks. See instructions. HTN: Seems controlled, last BMP satisfactory, continue with Cardizem Insomnia: Reports is doing well, has not taken Xanax in  a while, will stop it  R knee pain with swelling: Rec to call rheumatology if not improving and particularly if she has fever, chills or redness. Had a Medicare wellness 06/15/2016 RTC 3-4 months, CPX.

## 2017-01-14 NOTE — Patient Instructions (Addendum)
GO TO THE LAB : Get the blood work     GO TO THE FRONT DESK Schedule your next appointment for a  physical exam in 3-4 months  If your blood sugars are not going back to normal in the next 4 weeks, please call the office

## 2017-01-15 ENCOUNTER — Other Ambulatory Visit: Payer: Self-pay | Admitting: Rheumatology

## 2017-01-15 LAB — URINALYSIS, MICROSCOPIC ONLY
Casts: NONE SEEN [LPF]
Yeast: NONE SEEN [HPF]

## 2017-01-15 LAB — HEMOGLOBIN A1C
HEMOGLOBIN A1C: 9.2 % — AB (ref <5.7)
Mean Plasma Glucose: 217 mg/dL

## 2017-01-15 LAB — MICROALBUMIN / CREATININE URINE RATIO
Creatinine, Urine: 205 mg/dL (ref 20–320)
Microalb Creat Ratio: 5 mcg/mg creat (ref <30)
Microalb, Ur: 1 mg/dL

## 2017-01-15 LAB — URINE CULTURE

## 2017-01-15 LAB — HEPATITIS C ANTIBODY: HCV AB: NEGATIVE

## 2017-01-16 NOTE — Assessment & Plan Note (Signed)
DM: Good compliance with acarbose-metformin. Last A1c 7.0. Diet and exercise unchanged. CBGs increased, probably related to prednisone taper. Will check A1c, micro. Will also check a UA, urine culture to r/o infex.    Consider adjust medication depending on A1c and CBG levels in next few weeks. See instructions. HTN: Seems controlled, last BMP satisfactory, continue with Cardizem Insomnia: Reports is doing well, has not taken Xanax in a while, will stop it  R knee pain with swelling: Rec to call rheumatology if not improving and particularly if she has fever, chills or redness. Had a Medicare wellness 06/15/2016 RTC 3-4 months, CPX.

## 2017-01-17 ENCOUNTER — Telehealth: Payer: Self-pay | Admitting: Internal Medicine

## 2017-01-17 NOTE — Telephone Encounter (Signed)
Patient called to state that she is totally out of test strips and need them today.

## 2017-01-17 NOTE — Telephone Encounter (Signed)
Left message for patient to call the office

## 2017-01-17 NOTE — Telephone Encounter (Signed)
Rx has been sent  

## 2017-01-18 ENCOUNTER — Telehealth: Payer: Self-pay | Admitting: *Deleted

## 2017-01-18 NOTE — Addendum Note (Signed)
Addended byConrad Richland: Laycee Fitzsimmons D on: 01/18/2017 04:36 PM   Modules accepted: Orders

## 2017-01-18 NOTE — Telephone Encounter (Signed)
Attempted to contact the patient and left message for patient to call the office.  

## 2017-01-18 NOTE — Telephone Encounter (Signed)
Patient advised she does not need the prednisone. Patient states the pharmacy had it on a automatic refill

## 2017-01-19 ENCOUNTER — Ambulatory Visit: Payer: Medicare Other | Admitting: Internal Medicine

## 2017-01-23 ENCOUNTER — Other Ambulatory Visit: Payer: Self-pay | Admitting: Internal Medicine

## 2017-01-24 ENCOUNTER — Telehealth: Payer: Self-pay | Admitting: Rheumatology

## 2017-01-24 NOTE — Telephone Encounter (Signed)
Patient left a message requesting pain medication for pain.  BJ#[phone]#[phone].  Thank you

## 2017-01-25 NOTE — Telephone Encounter (Signed)
Patient states she is having pain in her right knee and her hip. Patient states this pain has been going on for a few weeks and has gotten worse. Patient states has appointments scheduled for Euflexxa injections starting on 02/23/17. Patient states she has tried Aspirin to help with her pain and it helped with the pain but makes her drowsy. Patient has also been alternating heat and ice. Patient wants to know what else she can do for the pain.

## 2017-01-25 NOTE — Telephone Encounter (Signed)
Patient has been schedule for 01/26/17 at 8:30 am

## 2017-01-25 NOTE — Telephone Encounter (Signed)
She can get a cortisone injection

## 2017-01-26 ENCOUNTER — Encounter: Payer: Self-pay | Admitting: Rheumatology

## 2017-01-26 ENCOUNTER — Ambulatory Visit (INDEPENDENT_AMBULATORY_CARE_PROVIDER_SITE_OTHER): Payer: Medicare Other | Admitting: Rheumatology

## 2017-01-26 VITALS — BP 138/76 | HR 82

## 2017-01-26 DIAGNOSIS — Z8739 Personal history of other diseases of the musculoskeletal system and connective tissue: Secondary | ICD-10-CM | POA: Diagnosis not present

## 2017-01-26 DIAGNOSIS — M19042 Primary osteoarthritis, left hand: Secondary | ICD-10-CM

## 2017-01-26 DIAGNOSIS — M7071 Other bursitis of hip, right hip: Secondary | ICD-10-CM

## 2017-01-26 DIAGNOSIS — E782 Mixed hyperlipidemia: Secondary | ICD-10-CM | POA: Diagnosis not present

## 2017-01-26 DIAGNOSIS — M170 Bilateral primary osteoarthritis of knee: Secondary | ICD-10-CM | POA: Diagnosis not present

## 2017-01-26 DIAGNOSIS — E119 Type 2 diabetes mellitus without complications: Secondary | ICD-10-CM

## 2017-01-26 DIAGNOSIS — I10 Essential (primary) hypertension: Secondary | ICD-10-CM

## 2017-01-26 DIAGNOSIS — M19041 Primary osteoarthritis, right hand: Secondary | ICD-10-CM

## 2017-01-26 MED ORDER — LIDOCAINE HCL 1 % IJ SOLN
1.0000 mL | INTRAMUSCULAR | Status: AC | PRN
  Administered 2017-01-26: 1 mL

## 2017-01-26 MED ORDER — TRIAMCINOLONE ACETONIDE 40 MG/ML IJ SUSP
40.0000 mg | INTRAMUSCULAR | Status: AC | PRN
  Administered 2017-01-26: 40 mg via INTRA_ARTICULAR

## 2017-01-26 NOTE — Progress Notes (Signed)
Office Visit Note  Patient: [name] [name]             Date of Birth: [birth year]           MRN: [id number]             PCP: Willow Ora, MD Referring: Wanda Plump, MD Visit Date: 01/26/2017 Occupation: @GUAROCC @    Subjective:  Injections (right knee and right ischial bursa area are painful)   History of Present Illness: [name] [name] is a 71 y.o. female she tried colchicine after the last visit for pseudogout and could not tolerate it. She states that she cannot take NSAIDs due to GI side effects. She can only take aspirin to relieve discomfort. Her right knee joint has been hurting for 2 weeks now and in the last 2 days she's been having increased pain in her right knee and her right trochanteric area. She reports difficulty walking.   Activities of Daily Living:  Patient reports morning stiffness for 0 minute.   Patient Reports nocturnal pain.  Difficulty dressing/grooming: Denies Difficulty climbing stairs: Reports Difficulty getting out of chair: Reports Difficulty using hands for taps, buttons, cutlery, and/or writing: Reports   Review of Systems  Constitutional: Negative for fatigue, night sweats, weight gain, weight loss and weakness.  HENT: Positive for mouth dryness. Negative for mouth sores, trouble swallowing, trouble swallowing and nose dryness.   Eyes: Negative for pain, redness, visual disturbance and dryness.  Respiratory: Negative for cough, shortness of breath and difficulty breathing.   Cardiovascular: Negative for chest pain, palpitations, hypertension, irregular heartbeat and swelling in legs/feet.  Gastrointestinal: Negative for blood in stool, constipation and diarrhea.  Endocrine: Negative for increased urination.  Genitourinary: Negative for vaginal dryness.  Musculoskeletal: Positive for arthralgias, joint pain, joint swelling and morning stiffness. Negative for myalgias, muscle weakness, muscle tenderness and myalgias.  Skin: Negative for color  change, rash, hair loss, skin tightness, ulcers and sensitivity to sunlight.  Allergic/Immunologic: Negative for susceptible to infections.  Neurological: Negative for dizziness, memory loss and night sweats.  Hematological: Negative for swollen glands.  Psychiatric/Behavioral: Positive for sleep disturbance. Negative for depressed mood. The patient is not nervous/anxious.     PMFS History:  Patient Active Problem List   Diagnosis Date Noted  . 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 History of hypertension 12/08/2016  . History of diabetes mellitus 12/08/2016  . Dyslipidemia 12/08/2016  . Osteoarthritis of both hands 09/14/2016  . Osteoarthritis of both feet 09/14/2016  . Osteoarthritis of both knees 09/14/2016  . PCP NOTES >>> 08/19/2015  . Low back pain 09/09/2014  . Neuralgia of left saphenous nerve 06/05/2014  . Numbness and tingling in left arm 06/05/2014  . Paresthesia of left arm 05/21/2014  . Gastroparesis 02/20/2013  . Insomnia 02/20/2013  . Annual physical exam 03/31/2012  . DIVERTICULITIS, HX OF 04/14/2010  . Osteopenia 05/14/2009  . DYSPHAGIA UNSPECIFIED 01/10/2009  . 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 Hyperlipidemia 06/14/2007  . Essential hypertension 06/14/2007  . Osteoarthritis 06/14/2007  . DM (diabetes mellitus) (HCC) 01/02/2007    Past Medical History:  Diagnosis Date  . Bilateral knee pain    Euflexxa x3  . Chest pain 06/2007   CP and QW on EKG: neg stress test   . Diabetes mellitus    w/ neuropathy  . Diverticulitis 2000  . Hyperlipidemia   . Hypertension   . Insomnia 02/20/2013  . Osteoarthritis    gout, CPPD--- sees Dr.Davenshwar  . Osteoarthritis of  both feet 09/14/2016  . Osteoarthritis of both hands 09/14/2016  . Osteoarthritis of both knees 09/14/2016  . Osteopenia    per DEXA 4/10  . Recurrent boils    h/o MRSA    Family History  Problem Relation Age of Onset  . Hypertension Mother   . Diabetes Mother   . Hypertension Father   . Diabetes  Father   . Hypertension Daughter   . Hypertension Sister   . Diabetes Sister   . Coronary artery disease Neg Hx   . Stroke Neg Hx   . Colon cancer Neg Hx   . Breast cancer Neg Hx    Past Surgical History:  Procedure Laterality Date  . CHOLECYSTECTOMY    . TONSILLECTOMY     Social History   Social History Narrative   Lives w/ husband   Patient is married with 3 children.   Patient is right handed.   Patient has college education.   Patient drinks 3-4 daily.     Objective: Vital Signs: BP 138/76   Pulse 82    Physical Exam  Constitutional: She is oriented to person, place, and time. She appears well-developed and well-nourished.  HENT:  Head: Normocephalic and atraumatic.  Eyes: Conjunctivae and EOM are normal.  Neck: Normal range of motion.  Cardiovascular: Normal rate, regular rhythm, normal heart sounds and intact distal pulses.   Pulmonary/Chest: Effort normal and breath sounds normal.  Abdominal: Soft. Bowel sounds are normal.  Lymphadenopathy:    She has no cervical adenopathy.  Neurological: She is alert and oriented to person, place, and time.  Skin: Skin is warm and dry. Capillary refill takes less than 2 seconds.  Psychiatric: She has a normal mood and affect. Her behavior is normal.  Nursing note and vitals reviewed.    Musculoskeletal Exam: C-spine and thoracic lumbar spine good range of motion. She is good range of motion of her shoulders elbows she. She is PIP DIP thickening in her hands consistent with osteoarthritis. She is tenderness on palpation of her right shoulder bursa consistent with history of bursitis. She is some warmth and swelling in her right knee joint. She is crepitus with range of motion of bilateral knee joints.  CDAI Exam: No CDAI exam completed.    Investigation: No additional findings.   Imaging: No results found.  Speciality Comments: No specialty comments available.    Procedures:  Large Joint Inj Date/Time:  01/26/2017 9:19 AM Performed by: Pollyann Savoy Authorized by: Pollyann Savoy   Consent Given by:  Patient Site marked: the procedure site was marked   Timeout: prior to procedure the correct patient, procedure, and site was verified   Indications:  Pain Location:  Hip (Right Ischial Bursitis) Approach:  Posterior Ultrasound Guidance: No   Fluoroscopic Guidance: No   Arthrogram: No   Medications:  1 mL lidocaine 1 %; 40 mg triamcinolone acetonide 40 MG/ML Aspiration Attempted: No   Aspirate amount (mL):  0 Patient tolerance:  Patient tolerated the procedure well with no immediate complications   Allergies: Colcrys [colchicine]; Hydrocodone; Sulindac; Tramadol; Celecoxib; and Glimepiride   Assessment / Plan:     Visit Diagnoses: Ischial bursitis of right side: She has severe pain and discomfort in the right tissue bursa. After different treatment options were discussed and informed consent was obtained right tissue bursa was prepped in sterile fashion and injected with cortisone is as described above. I've also advised her to not to sit for prolonged time in one position and also use  a cushion while she is sitting.  Primary osteoarthritis of both knees: Chronic pain she's scheduled to have Visco supplement injections.  Primary osteoarthritis of both hands: She has severe arthritis  CPPD knees hands: She could not tolerate colchicine. She cannot take NSAIDs either just she has side effects she takes aspirin on when necessary basis.  Type 2 diabetes mellitus without complication, without long-term current use of insulin (HCC): Advised to monitor sugar  Mixed hyperlipidemia  Essential hypertension : Advised to monitor blood pressure   Orders: Orders Placed This Encounter  Procedures  . Large Joint Injection/Arthrocentesis   No orders of the defined types were placed in this encounter.   Face-to-face time spent with patient was 30 minutes. 50% of time was spent in  counseling and coordination of care.  Follow-Up Instructions: Return for Osteoarthritis, pseudogout.   Pollyann SavoyShaili Arelyn Gauer, MD  Note - This record has been created using Animal nutritionistDragon software.  Chart creation errors have been sought, but may not always  have been located. Such creation errors do not reflect on  the standard of medical care.

## 2017-01-26 NOTE — Progress Notes (Signed)
Medication Samples have been provided to the patient.  Drug name: Pennsaid       Strength: 2%        Qty: 2 boxes LOT: [id number]  Exp.Date: 09/2017  Dosing instructions: Apply to the right knee twice daily as needed  The patient has been instructed regarding the correct time, dose, and frequency of taking this medication, including desired effects and most common side effects.   Chesley MiresRachel L Shann Lewellyn 9:27 AM 01/26/2017

## 2017-02-01 ENCOUNTER — Other Ambulatory Visit: Payer: Self-pay | Admitting: Internal Medicine

## 2017-02-01 DIAGNOSIS — Z1231 Encounter for screening mammogram for malignant neoplasm of breast: Secondary | ICD-10-CM

## 2017-02-08 ENCOUNTER — Ambulatory Visit (INDEPENDENT_AMBULATORY_CARE_PROVIDER_SITE_OTHER): Payer: Medicare Other | Admitting: Endocrinology

## 2017-02-08 ENCOUNTER — Other Ambulatory Visit: Payer: Self-pay

## 2017-02-08 ENCOUNTER — Encounter: Payer: Self-pay | Admitting: Endocrinology

## 2017-02-08 ENCOUNTER — Telehealth: Payer: Self-pay | Admitting: Endocrinology

## 2017-02-08 VITALS — BP 124/74 | HR 88 | Ht 63.0 in | Wt 131.0 lb

## 2017-02-08 DIAGNOSIS — E1165 Type 2 diabetes mellitus with hyperglycemia: Secondary | ICD-10-CM

## 2017-02-08 DIAGNOSIS — E7800 Pure hypercholesterolemia, unspecified: Secondary | ICD-10-CM | POA: Diagnosis not present

## 2017-02-08 DIAGNOSIS — R634 Abnormal weight loss: Secondary | ICD-10-CM

## 2017-02-08 DIAGNOSIS — R5383 Other fatigue: Secondary | ICD-10-CM

## 2017-02-08 DIAGNOSIS — K529 Noninfective gastroenteritis and colitis, unspecified: Secondary | ICD-10-CM

## 2017-02-08 DIAGNOSIS — I10 Essential (primary) hypertension: Secondary | ICD-10-CM

## 2017-02-08 LAB — CBC WITH DIFFERENTIAL/PLATELET
BASOS PCT: 0.9 % (ref 0.0–3.0)
Basophils Absolute: 0.1 10*3/uL (ref 0.0–0.1)
EOS PCT: 0.8 % (ref 0.0–5.0)
Eosinophils Absolute: 0.1 10*3/uL (ref 0.0–0.7)
HCT: 39.4 % (ref 36.0–46.0)
Hemoglobin: 13.2 g/dL (ref 12.0–15.0)
LYMPHS ABS: 2.1 10*3/uL (ref 0.7–4.0)
Lymphocytes Relative: 25.6 % (ref 12.0–46.0)
MCHC: 33.4 g/dL (ref 30.0–36.0)
MCV: 88.7 fl (ref 78.0–100.0)
MONOS PCT: 5.8 % (ref 3.0–12.0)
Monocytes Absolute: 0.5 10*3/uL (ref 0.1–1.0)
NEUTROS ABS: 5.6 10*3/uL (ref 1.4–7.7)
NEUTROS PCT: 66.9 % (ref 43.0–77.0)
PLATELETS: 290 10*3/uL (ref 150.0–400.0)
RBC: 4.45 Mil/uL (ref 3.87–5.11)
RDW: 14.7 % (ref 11.5–15.5)
WBC: 8.3 10*3/uL (ref 4.0–10.5)

## 2017-02-08 LAB — COMPREHENSIVE METABOLIC PANEL
ALK PHOS: 74 U/L (ref 39–117)
ALT: 23 U/L (ref 0–35)
AST: 13 U/L (ref 0–37)
Albumin: 4.4 g/dL (ref 3.5–5.2)
BUN: 6 mg/dL (ref 6–23)
CO2: 27 mEq/L (ref 19–32)
Calcium: 9.9 mg/dL (ref 8.4–10.5)
Chloride: 102 mEq/L (ref 96–112)
Creatinine, Ser: 0.62 mg/dL (ref 0.40–1.20)
GFR: 122.51 mL/min (ref >60.00)
GLUCOSE: 206 mg/dL — AB (ref 70–99)
POTASSIUM: 4 meq/L (ref 3.5–5.1)
SODIUM: 137 meq/L (ref 135–145)
TOTAL PROTEIN: 7.3 g/dL (ref 6.0–8.3)
Total Bilirubin: 0.4 mg/dL (ref 0.2–1.2)

## 2017-02-08 LAB — TSH: TSH: 0.9 u[IU]/mL (ref 0.35–4.50)

## 2017-02-08 MED ORDER — SITAGLIP PHOS-METFORMIN HCL ER 50-500 MG PO TB24: ORAL_TABLET | ORAL | 2 refills | Status: DC

## 2017-02-08 MED ORDER — GLUCOSE BLOOD VI STRP: ORAL_STRIP | 4 refills | Status: DC

## 2017-02-08 NOTE — Patient Instructions (Addendum)
No sugar drinks  Check blood sugars on waking up  3x weekly  Also check blood sugars about 2 hours after a meal and do this after different meals by rotation  Recommended blood sugar levels on waking up is 90-130 and about 2 hours after meal is 130-160  Please bring your blood sugar monitor to each visit, thank you

## 2017-02-08 NOTE — Telephone Encounter (Signed)
Patient started that the medication Alma Friendlyjanuvia was to expensive and pharmacy said she could not use the card because of medicare. Please advise

## 2017-02-08 NOTE — Progress Notes (Signed)
Patient ID: [name] [name], female   DOB: Dec 06, 1945, 71 y.o.   MRN: [id number]            Reason for Appointment: Consultation for Type 2 Diabetes  Referring Physician: Drue Novel   History of Present Illness:          Date of diagnosis of type 2 diabetes mellitus: 2008 ?        Background history:   Not clear how she was diagnosed initially but she was having symptoms of frequent urination She has taken mostly metformin for her treatment since the diagnosis Overall has tried several medications in the past including Januvia, Onglyza, Actos and acarbose Her control had been generally fairly good with A1c around 7% since about 2013 Unclear from her history and office notes why her medications have been changed over the last 3 years She may have had some swelling of her legs with Actos  Recent history:        Non-insulin hypoglycemic drugs the patient is taking are: Metformin 1 g twice a day, acarbose 100 mg 3 times a day  Current management, blood sugar patterns and problems identified:  She is using the same glucose meter for checking her sugar as her husband's and not clear which readings are hers  However her blood sugars have been much higher at least the last month  She checks her blood sugars primarily fasting although about 2 weeks ago when her sugar was over 400 she checked it later in the day for couple of days and blood sugars were mostly over 200 nonfasting  More recently her fasting readings are ranging from about 180-213         Side effects from medications have been:?  Edema from Actos   Compliance with the medical regimen: Inadequate  Glucose monitoring:  done 1  times a day         Glucometer: One Touch ultra mini .      Blood Glucose readings as above  Self-care:    Meal times are quite variable  Sweet drinks or convergent regularly, 3-4/day including regular soft drinks and juice Typical meal intake: Breakfast is oatmeal usually, eating at 10-11 AM.  Evening  meal is usually 6-7 PM She will mostly meat and vegetables in the evenings Snacks will be cookies, potato chips and yogurt               Dietician visit, most recent:?  2015               Exercise: Limited by fatigue and knee pain, just walking around the house  Weight history:  Wt Readings from Last 3 Encounters:  02/08/17 131 lb (59.4 kg)  01/14/17 137 lb (62.1 kg)  12/09/16 138 lb (62.6 kg)    Glycemic control:   Lab Results  Component Value Date   HGBA1C 9.2 (H) 01/14/2017   HGBA1C 7.0 (H) 07/12/2016   HGBA1C 7.2 (H) 03/02/2016   Lab Results  Component Value Date   MICROALBUR 1.0 01/14/2017   LDLCALC 83 03/02/2016   CREATININE 0.63 12/09/2016   Lab Results  Component Value Date   MICRALBCREAT 5 01/14/2017    No results found for: FRUCTOSAMINE    Allergies as of 02/08/2017      Reactions   Colcrys [colchicine] Nausea Only   Hydrocodone    Sulindac    Tramadol    "Made me feel like I was in another world."   Celecoxib Rash   Glimepiride  Rash      Medication List       Accurate as of 02/08/17  1:09 PM. Always use your most recent med list.          aspirin 81 MG tablet Take 81 mg by mouth daily.   CLARITIN PO Take 10 mg by mouth daily as needed.   colchicine 0.6 MG tablet 1 tablet by mouth daily when necessary   diltiazem 300 MG 24 hr capsule Commonly known as:  CARDIZEM CD Take 1 capsule (300 mg total) by mouth daily.   DULoxetine 30 MG capsule Commonly known as:  CYMBALTA Take 30 mg by mouth daily.   fluticasone 50 MCG/ACT nasal spray Commonly known as:  FLONASE Place 2 sprays into both nostrils daily.   glucose blood test strip Commonly known as:  ONETOUCH VERIO Use to test blood sugar once daily   metoCLOPramide 10 MG tablet Commonly known as:  REGLAN Take 10 mg by mouth 4 (four) times daily.   OMEGA 3 PO Take 1 tablet by mouth daily. Reported on 11/18/2015   PENNSAID 1.5 % Soln Generic drug:  Diclofenac Sodium Place onto  the skin.   pravastatin 20 MG tablet Commonly known as:  PRAVACHOL Take 1 tablet (20 mg total) by mouth at bedtime.   SitaGLIPtin-MetFORMIN HCl 50-500 MG Tb24 Commonly known as:  JANUMET XR 2 tabs qd   TURMERIC PO Take 1 tablet by mouth daily. OTC   Vitamin D 2000 units Caps Take 2,000 Units by mouth daily. Reported on 11/18/2015   zolpidem 10 MG tablet Commonly known as:  AMBIEN Take 10 mg by mouth at bedtime as needed for sleep.       Allergies:  Allergies  Allergen Reactions  . Colcrys [Colchicine] Nausea Only  . Hydrocodone   . Sulindac   . Tramadol     "Made me feel like I was in another world."  . Celecoxib Rash  . Glimepiride Rash    Past Medical History:  Diagnosis Date  . Bilateral knee pain    Euflexxa x3  . Chest pain 06/2007   CP and QW on EKG: neg stress test   . Diabetes mellitus    w/ neuropathy  . Diverticulitis 2000  . Hyperlipidemia   . Hypertension   . Insomnia 02/20/2013  . Osteoarthritis    gout, CPPD--- sees Dr.Davenshwar  . Osteoarthritis of both feet 09/14/2016  . Osteoarthritis of both hands 09/14/2016  . Osteoarthritis of both knees 09/14/2016  . Osteopenia    per DEXA 4/10  . Recurrent boils    h/o MRSA    Past Surgical History:  Procedure Laterality Date  . CHOLECYSTECTOMY    . TONSILLECTOMY      Family History  Problem Relation Age of Onset  . Hypertension Mother   . Diabetes Mother   . Hypertension Father   . Diabetes Father   . Hypertension Daughter   . Hypertension Sister   . Diabetes Sister   . Coronary artery disease Neg Hx   . Stroke Neg Hx   . Colon cancer Neg Hx   . Breast cancer Neg Hx     Social History:  reports that she has never smoked. She has never used smokeless tobacco. She reports that she does not drink alcohol or use drugs.   Review of Systems  Constitutional: Negative for reduced appetite.  HENT: Negative for trouble swallowing.   Eyes: Negative for blurred vision.  Respiratory: Negative  for shortness of breath.  Cardiovascular: Negative for palpitations.  Gastrointestinal: Positive for nausea and diarrhea. Negative for abdominal pain.  Endocrine: Positive for fatigue.  Musculoskeletal: Positive for joint pain.  Skin: Negative for rash.  Neurological: Negative for numbness, tingling and balance difficulty.  Psychiatric/Behavioral: Negative for depressed mood.     Lipid history: On Pravachol 20 mg with last lipids as follows:    Lab Results  Component Value Date   CHOL 184 03/02/2016   HDL 90.20 03/02/2016   LDLCALC 83 03/02/2016   LDLDIRECT 111.1 02/20/2013   TRIG 54.0 03/02/2016   CHOLHDL 2 03/02/2016           Hypertension:Currently taking diltiazem for this, not on ACE inhibitor  Most recent eye exam was 12/17  Most recent foot exam:01/2017     Physical Examination:  BP 124/74   Pulse 88   Ht 5\' 3"  (1.6 m)   Wt 131 lb (59.4 kg)   SpO2 98%   BMI 23.21 kg/m   GENERAL:    she is averagely built and nourished HEENT:         Eye exam shows normal external appearance. Fundus exam shows no retinopathy.  Oral exam shows normal mucosa, pharynx not clearly visualized but appears normal .  NECK:   There is no lymphadenopathy  Thyroid is not enlarged and no nodules felt.  Carotids are normal to palpation and no bruit heard LUNGS:         Chest is symmetrical. Lungs are clear to auscultation.Marland Kitchen   HEART sounds:  S1 and S2 are normal. No murmur or click heard., no S3 or S4.   ABDOMEN:   There is mild tympanitic distention present. Liver and spleen are not palpable.  No other mass present, has minimal diffuse tenderness   NEUROLOGICAL:   Ankle jerks are 1+ bilaterally. No tremor present Motor power grossly normal  Diabetic Foot Exam - Simple   Simple Foot Form Diabetic Foot exam was performed with the following findings:  Yes   Visual Inspection No deformities, no ulcerations, no other skin breakdown bilaterally:  Yes Sensation Testing Intact to  touch and monofilament testing bilaterally:  Yes Pulse Check Posterior Tibialis and Dorsalis pulse intact bilaterally:  Yes Comments            Vibration sense is Minimally reduced in distal first toes. MUSCULOSKELETAL:  There is mild swelling and enlargement of several finger joints  Spine is normal to inspection.   EXTREMITIES:     There is no edema.  She has a large black pigmented nevus on her left forearm SKIN:       No rash or lesions of concern.        ASSESSMENT:  Diabetes type 2, uncontrolled     Patient has had recently worsening of her control with A1c 9.2% earlier this month Also has blood sugars mostly around 200 fasting She has had long-standing diabetes and currently is on metformin and acarbose Her diet is inconsistent and she is drinking large amounts of regular drinks which contain sugar She has lost weight but this is from her inconsistent appetite and diarrhea which is relatively new  She is not able to do much exercise because of her knee joint pain and fatigue  Complications of diabetes: None evident  GI symptoms: She has had recent diarrhea which is mostly postprandial and has not had this evaluated Also has nonspecific nausea at times and not clear why she was prescribed Reglan.  Hypertension: Well controlled  Mild hyperlipidemia:  Previously controlled, needs follow-up levels  PLAN:     She will stop drinking all regular soft drinks and juices, given suggestions on other alternatives which are sugar-free  Needs to have some protein at each meal  She will see the dietitian for formal meal planning  To reduce GI side effects she will stop her metformin and acarbose is completely  Trial of Janumet XR 50/500, 2 tablets daily, 30 day free trial coupon also given.  Discussed how Januvia works and need for continuing metformin as basic treatment  Although she may benefit from an SGLT 2 drug with improved control and long-term benefits she currently is  experiencing some weight loss and will hold off on this  She will be given a new One Touch Verio monitor so she can check her blood sugars more accurately and without sharing meter with her husband.  She does need to rotate the times of her glucose monitoring and not just do readings in the morning  She will follow-up in one month  For her symptoms of fatigue and GI problems will do basic labs including TSH, CMP and CBC and forward to PCP She does need to schedule an appointment to evaluate her symptoms of diarrhea  Patient Instructions  No sugar drinks  Check blood sugars on waking up  3x weekly  Also check blood sugars about 2 hours after a meal and do this after different meals by rotation  Recommended blood sugar levels on waking up is 90-130 and about 2 hours after meal is 130-160  Please bring your blood sugar monitor to each visit, thank you       Consultation note has been sent to the referring physician  [facility] CenterKUMAR,Aasir Daigler 02/08/2017, 1:09 PM   Note: This office note was prepared with Dragon voice recognition system technology. Any transcriptional errors that result from this process are unintentional.

## 2017-02-09 ENCOUNTER — Other Ambulatory Visit: Payer: Self-pay

## 2017-02-09 MED ORDER — GLIMEPIRIDE 1 MG PO TABS: 1.0000 mg | ORAL_TABLET | Freq: Two times a day (BID) | ORAL | 3 refills | Status: DC

## 2017-02-09 MED ORDER — METFORMIN HCL ER 750 MG PO TB24: 750.0000 mg | ORAL_TABLET | Freq: Two times a day (BID) | ORAL | 3 refills | Status: DC

## 2017-02-09 NOTE — Telephone Encounter (Signed)
The only option she has is to take metformin ER 750 mg, one tablet twice a day along with glimepiride 1 mg twice a day.  Please send prescriptions This may or may not control her blood sugars, will discuss possibility of using insulin on the next visit if needed

## 2017-02-09 NOTE — Telephone Encounter (Signed)
Ordered

## 2017-02-10 ENCOUNTER — Telehealth: Payer: Self-pay | Admitting: Rheumatology

## 2017-02-10 MED ORDER — DICLOFENAC SODIUM 2 % TD SOLN
TRANSDERMAL | 2 refills | Status: DC
Start: 2017-02-10 — End: 2017-06-14

## 2017-02-10 NOTE — Telephone Encounter (Signed)
Patient called requesting samples of pennsaid. Please call patient if we have any available.

## 2017-02-10 NOTE — Telephone Encounter (Signed)
Patient advised she may come by the office to pick up samples. And prescription will be sent to the pharmacy.

## 2017-02-10 NOTE — Telephone Encounter (Signed)
May do both

## 2017-02-10 NOTE — Telephone Encounter (Signed)
Patient is requesting samples of Pennsaid. We have 4 boxes left in the office. Would you like to provided the patient with samples or prefer to send a prescription to the pharmacy for patient?

## 2017-02-10 NOTE — Telephone Encounter (Signed)
Medication Samples have been provided to the patient.  Drug name: Pennsaid     Strength:2%     Qty: 4  LOT: [id number]  Exp.Date: 09/2017  Dosing instructions: Apply 1 packet to each knee BID  The patient has been instructed regarding the correct time, dose, and frequency of taking this medication, including desired effects and most common side effects.   Dulcy Fannyndrea Korie Streat 3:09 PM 02/10/2017

## 2017-02-12 ENCOUNTER — Other Ambulatory Visit: Payer: Self-pay | Admitting: Internal Medicine

## 2017-02-13 ENCOUNTER — Other Ambulatory Visit: Payer: Self-pay | Admitting: Internal Medicine

## 2017-02-15 ENCOUNTER — Ambulatory Visit: Payer: Medicare Other | Admitting: Rheumatology

## 2017-02-15 ENCOUNTER — Telehealth: Payer: Self-pay

## 2017-02-15 NOTE — Telephone Encounter (Signed)
Received a conformation from cover my meds regarding a prior authorization approval for Pennsaid through 11/14/2017.   Reference number: [id number] Phone number: [phone]  Spoke with patient who voiced understanding and denies and other questions about her medication.  Kenney Going, [place], CPhT   11:56 AM

## 2017-02-16 ENCOUNTER — Telehealth: Payer: Self-pay

## 2017-02-16 NOTE — Telephone Encounter (Signed)
[name] [name] received message from Dr. Lucianne Muss (endo), Pt experiencing GI issues and recommended she see PCP. Can you call Pt to schedule at her convenience please? Thank you.

## 2017-02-16 NOTE — Telephone Encounter (Signed)
Pt has been scheduled.  °

## 2017-02-18 ENCOUNTER — Ambulatory Visit (INDEPENDENT_AMBULATORY_CARE_PROVIDER_SITE_OTHER): Payer: Medicare Other | Admitting: Internal Medicine

## 2017-02-18 ENCOUNTER — Encounter: Payer: Self-pay | Admitting: Internal Medicine

## 2017-02-18 VITALS — BP 126/66 | HR 68 | Temp 97.9°F | Resp 14 | Ht 63.0 in | Wt 132.5 lb

## 2017-02-18 DIAGNOSIS — E118 Type 2 diabetes mellitus with unspecified complications: Secondary | ICD-10-CM | POA: Diagnosis not present

## 2017-02-18 DIAGNOSIS — K3184 Gastroparesis: Secondary | ICD-10-CM | POA: Diagnosis not present

## 2017-02-18 DIAGNOSIS — R21 Rash and other nonspecific skin eruption: Secondary | ICD-10-CM

## 2017-02-18 DIAGNOSIS — G4700 Insomnia, unspecified: Secondary | ICD-10-CM | POA: Diagnosis not present

## 2017-02-18 MED ORDER — ALPRAZOLAM 0.5 MG PO TABS: 0.2500 mg | ORAL_TABLET | Freq: Every evening | ORAL | 1 refills | Status: DC | PRN

## 2017-02-18 NOTE — Patient Instructions (Addendum)
Go to the lab and collect  the cups for  stool sample  Take the medications as prescribed  Take Reglan with each meal and at bedtime  Will refer you to the GI doctors  Will refer you to the allergist

## 2017-02-18 NOTE — Progress Notes (Signed)
Pre visit review using our clinic review tool, if applicable. No additional management support is needed unless otherwise documented below in the visit note. 

## 2017-02-18 NOTE — Progress Notes (Signed)
Subjective:    Patient ID: [name] [name], female    DOB: 07/27/1946, 71 y.o.   MRN: [id number]  DOS:  02/18/2017 Type of visit - description : Acute Interval history: Saw Dr. Lucianne Muss 02/08/2017, she had GI complaints, was recommended to come here.  They recommend to stop metformin and acarbose . Trial with janumet XR 50-500: 2 tablets daily CMP, CBC and TSH were okay.  The patient reports that around 6 weeks ago she started with generalized abdominal discomfort, "not really pain, not really bloated", the discomfort is apparently is throughout the abdomen. It happens every day. Her appetite has been consistently decreased lately in part because whenever she eats she gets full after a few bites.  Also, she reports diarrhea described as watery stool without blood, he is started about 6-8 weeks ago but has already resolved 2 weeks ago.  She is somewhat confused about her diabetes medication.   Wt Readings from Last 3 Encounters:  02/18/17 132 lb 8 oz (60.1 kg)  02/08/17 131 lb (59.4 kg)  01/14/17 137 lb (62.1 kg)    Review of Systems  At this point she denies nausea per se. No blood in the stools. No odynophagia or dysphagia. No major problems with fatigue, her energy level is okay most days. Request a refill of Xanax for insomnia.  Past Medical History:  Diagnosis Date  . Bilateral knee pain    Euflexxa x3  . Chest pain 06/2007   CP and QW on EKG: neg stress test   . Diabetes mellitus    w/ neuropathy  . Diverticulitis 2000  . Hyperlipidemia   . Hypertension   . Insomnia 02/20/2013  . Osteoarthritis    gout, CPPD--- sees Dr.Davenshwar  . Osteoarthritis of both feet 09/14/2016  . Osteoarthritis of both hands 09/14/2016  . Osteoarthritis of both knees 09/14/2016  . Osteopenia    per DEXA 4/10  . Recurrent boils    h/o MRSA    Past Surgical History:  Procedure Laterality Date  . CHOLECYSTECTOMY    . TONSILLECTOMY      Social History   Social History  . Marital  status: Married    Spouse name: herbert  . Number of children: 3  . Years of education: college   Occupational History  . 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 Smoking status: Never Smoker  . Smokeless tobacco: Never Used  . Alcohol use No  . Drug use: No  . Sexual activity: Not Currently   Other Topics Concern  . Not on file   Social History Narrative   Lives w/ husband   Patient is married with 3 children.   Patient is right handed.   Patient has college education.   Patient drinks 3-4 daily.      Allergies as of 02/18/2017      Reactions   Colcrys [colchicine] Nausea Only   Hydrocodone    Sulindac    Tramadol    "Made me feel like I was in another world."   Celecoxib Rash   Glimepiride Rash      Medication List       Accurate as of 02/18/17 11:59 PM. Always use your most recent med list.          ALPRAZolam 0.5 MG tablet Commonly known as:  XANAX Take 0.5-1 tablets (0.25-0.5  mg total) by mouth at bedtime as needed for sleep.   aspirin 81 MG tablet Take 81 mg by mouth daily.   CLARITIN PO Take 10 mg by mouth daily as needed.   colchicine 0.6 MG tablet 1 tablet by mouth daily when necessary   diltiazem 300 MG 24 hr capsule Commonly known as:  CARDIZEM CD Take 1 capsule (300 mg total) by mouth daily.   DULoxetine 30 MG capsule Commonly known as:  CYMBALTA Take 30 mg by mouth daily.   fluticasone 50 MCG/ACT nasal spray Commonly known as:  FLONASE Place 2 sprays into both nostrils daily.   glucose blood test strip Commonly known as:  ONETOUCH VERIO Use to test blood sugar once daily   metoCLOPramide 10 MG tablet Commonly known as:  REGLAN Take 10 mg by mouth 4 (four) times daily.   OMEGA 3 PO Take 1 tablet by mouth daily. Reported on 11/18/2015   PENNSAID 1.5 % Soln Generic drug:  Diclofenac Sodium Place onto the skin.   Diclofenac  Sodium 2 % Soln Commonly known as:  PENNSAID Apply 2 pumps to each knee BID.   pravastatin 20 MG tablet Commonly known as:  PRAVACHOL Take 1 tablet (20 mg total) by mouth at bedtime.   SitaGLIPtin-MetFORMIN HCl 50-500 MG Tb24 Commonly known as:  JANUMET XR 2 tabs qd   TURMERIC PO Take 1 tablet by mouth daily. OTC   Vitamin D 2000 units Caps Take 2,000 Units by mouth daily. Reported on 11/18/2015          Objective:   Physical Exam BP 126/66 (BP Location: Left Arm, Patient Position: Sitting, Cuff Size: Small)   Pulse 68   Temp 97.9 F (36.6 C) (Oral)   Resp 14   Ht  (1.6 m)   Wt 132 lb 8 oz (60.1 kg)   SpO2 97%   BMI 23.47 kg/m  General:   Well developed, no distress, compared to last year, she seems  has to lose some ground HEENT:  Normocephalic . Face symmetric, atraumatic Lungs:  CTA B Normal respiratory effort, no intercostal retractions, no accessory muscle use. Heart: RRR,  no murmur.  no pretibial edema bilaterally  Abdomen:  Not distended, soft, non-tender. No rebound or rigidity.  Skin: Not pale. Not jaundice Neurologic:  alert & oriented X3.  Speech normal, gait slow, she seems a slightly weak and needs help getting on the table. Psych--  Cognition and judgment appear intact.  Cooperative with normal attention span and concentration.  Behavior appropriate. No anxious or depressed appearing.    Assessment & Plan:    Assessment > DM   -Self d/c glimepiride ~01/2015  (?s/e),  -unwilling to take Januvia d/t "bad reports", other DPP4s $$.  -Actos intolerant 07-2015 , -Started acarbose 08-2015, d/c per endo 01-2017-Neuropathy? h/o  paresthesias, s/p eval orthopedic surgery and neurology 2015,  NCS 03-2014 no neuropathy ; B12 wnl 2015 Gastroparesis --documented by gastric emptying study 10/2011. s/p  GI at multiple times, they rx Reglan. HTN Hyperlipidemia Insomnia - Ambien was okay but $!; on xanax prn MSK: Dr  Victory Dakin --[facility] --Gout --CPPD Osteopenia:  t score 2010 : -1.1, 03-2012: -1.3 GI: Sees DR Madilyn Fireman ---Gastroparesis (+ gastric empty study 2012) - reglan prn ---H/o diverticulitis H/o recurrent boils (MRSA) H/o CP: 2008 negative stress test  PLAN DM: Per endocrinology, recommend good med compliance  Gastroparesis, dyspepsia: Well-documented gastroparesis, she is now having decreased appetite, abdominal discomfort, early society, documented weight loss. Recent CBC and  LFTs normal. Plan: Stool studies (stool culture, WBCs, C. Difficile), check a UA urine culture. Refer back to [facility], Dr. Madilyn Fireman). Consistent use of Reglan. May need a CT abdomen. I don't see that she had one recently. Insomnia: States  they won't refill Ambien anymore (reason? insurance?). Requests Xanax instead: done. Denies depression or anxiety per se At the end of the visit, reports that she has a itchy rash (whelps) on and off. Refer to allergist.

## 2017-02-19 LAB — URINALYSIS, ROUTINE W REFLEX MICROSCOPIC
Bilirubin Urine: NEGATIVE
Glucose, UA: NEGATIVE
Hgb urine dipstick: NEGATIVE
KETONES UR: NEGATIVE
Leukocytes, UA: NEGATIVE
NITRITE: NEGATIVE
PH: 6 (ref 5.0–8.0)
Protein, ur: NEGATIVE
SPECIFIC GRAVITY, URINE: 1.009 (ref 1.001–1.035)

## 2017-02-19 NOTE — Assessment & Plan Note (Signed)
DM: Per endocrinology, recommend good med compliance  Gastroparesis, dyspepsia: Well-documented gastroparesis, she is now having decreased appetite, abdominal discomfort, early society, documented weight loss. Recent CBC and LFTs normal. Plan: Stool studies (stool culture, WBCs, C. Difficile), check a UA urine culture. Refer back to [facility], Dr. Madilyn Fireman). Consistent use of Reglan. May need a CT abdomen. I don't see that she had one recently. Insomnia: States  they won't refill Ambien anymore (reason? insurance?). Requests Xanax instead: done. Denies depression or anxiety per se At the end of the visit, reports that she has a itchy rash (whelps) on and off. Refer to allergist.

## 2017-02-20 ENCOUNTER — Other Ambulatory Visit: Payer: Self-pay | Admitting: Endocrinology

## 2017-02-20 LAB — URINE CULTURE: ORGANISM ID, BACTERIA: NO GROWTH

## 2017-02-20 MED ORDER — GLIPIZIDE ER 5 MG PO TB24: 5.0000 mg | ORAL_TABLET | Freq: Every day | ORAL | 1 refills | Status: DC

## 2017-02-21 ENCOUNTER — Telehealth: Payer: Self-pay

## 2017-02-21 NOTE — Telephone Encounter (Signed)
Patient has appt on 03/01/17 at 9:30am with Dr. Dorena Cookey

## 2017-02-22 NOTE — Addendum Note (Signed)
Addended by: Harley Alto on: 02/22/2017 02:43 PM   Modules accepted: Orders

## 2017-02-23 ENCOUNTER — Ambulatory Visit (INDEPENDENT_AMBULATORY_CARE_PROVIDER_SITE_OTHER): Payer: Medicare Other | Admitting: Rheumatology

## 2017-02-23 DIAGNOSIS — M25562 Pain in left knee: Secondary | ICD-10-CM

## 2017-02-23 DIAGNOSIS — M1711 Unilateral primary osteoarthritis, right knee: Secondary | ICD-10-CM | POA: Diagnosis not present

## 2017-02-23 DIAGNOSIS — M170 Bilateral primary osteoarthritis of knee: Secondary | ICD-10-CM

## 2017-02-23 DIAGNOSIS — M1712 Unilateral primary osteoarthritis, left knee: Secondary | ICD-10-CM

## 2017-02-23 DIAGNOSIS — M25561 Pain in right knee: Secondary | ICD-10-CM | POA: Diagnosis not present

## 2017-02-23 DIAGNOSIS — G8929 Other chronic pain: Secondary | ICD-10-CM

## 2017-02-23 LAB — CLOSTRIDIUM DIFFICILE BY PCR: CDIFFPCR: NOT DETECTED

## 2017-02-23 LAB — FECAL LACTOFERRIN, QUANT: LACTOFERRIN: NEGATIVE

## 2017-02-23 MED ORDER — LIDOCAINE HCL 1 % IJ SOLN
1.5000 mL | INTRAMUSCULAR | Status: AC | PRN
  Administered 2017-02-23: 1.5 mL

## 2017-02-23 MED ORDER — TRIAMCINOLONE ACETONIDE 40 MG/ML IJ SUSP
40.0000 mg | INTRAMUSCULAR | Status: AC | PRN
  Administered 2017-02-23: 40 mg via INTRA_ARTICULAR

## 2017-02-23 MED ORDER — SODIUM HYALURONATE (VISCOSUP) 20 MG/2ML IX SOSY
20.0000 mg | PREFILLED_SYRINGE | INTRA_ARTICULAR | Status: AC | PRN
  Administered 2017-02-23: 20 mg via INTRA_ARTICULAR

## 2017-02-23 NOTE — Progress Notes (Signed)
   Procedure Note  Patient: [name] [name]             Date of Birth: 1945/12/05           MRN: [id number]             Visit Date: 02/23/2017  Procedures: Visit Diagnoses: Primary osteoarthritis of both knees  Chronic pain of both knees  Large Joint Inj Date/Time: 02/23/2017 1:51 PM Performed by: Tawni Pummel Authorized by: Tawni Pummel   Consent Given by:  Patient Site marked: the procedure site was marked   Timeout: prior to procedure the correct patient, procedure, and site was verified   Indications:  Pain and joint swelling Location:  Knee Site:  R knee Prep: patient was prepped and draped in usual sterile fashion   Needle Size:  27 G Needle Length:  1.5 inches Approach:  Medial Ultrasound Guidance: No   Fluoroscopic Guidance: No   Arthrogram: No   Medications:  1.5 mL lidocaine 1 %; 20 mg Sodium Hyaluronate 20 MG/2ML Aspiration Attempted: Yes   Aspirate amount (mL):  0 Patient tolerance:  Patient tolerated the procedure well with no immediate complications  Euflexxa No. 1 bilateral knees  Note: Patient is complaining of some right knee joint pain off and on. She has a right knee brace but did not remember to use it. She is not sure how old it is so she will bring it to her next visit so I can examine it. I've advised the patient to use it during the waking hours when she is content to be ambulating or standing on her feet to better control her knee discomfort. I have advised her to especially use it for the next few weeks while she is getting her Euflex injections so we can ensure maximum benefit from the Euflexxa treatment. Patient understands and is agreeable Large Joint Inj Date/Time: 02/23/2017 1:53 PM Performed by: Tawni Pummel Authorized by: Tawni Pummel   Consent Given by:  Patient Site marked: the procedure site was marked   Timeout: prior to procedure the correct patient, procedure, and site was verified   Indications:  Pain and joint  swelling Location:  Knee Site:  L knee Prep: patient was prepped and draped in usual sterile fashion   Needle Size:  27 G Needle Length:  1.5 inches Approach:  Medial Ultrasound Guidance: No   Fluoroscopic Guidance: No   Arthrogram: No   Medications:  1.5 mL lidocaine 1 %; 40 mg triamcinolone acetonide 40 MG/ML; 20 mg Sodium Hyaluronate 20 MG/2ML Aspiration Attempted: Yes   Aspirate amount (mL):  0 Patient tolerance:  Patient tolerated the procedure well with no immediate complications  Euflexxa No. 1 bilateral knees  Note: Patient is complaining of some right knee joint pain off and on. She has a right knee brace but did not remember to use it. She is not sure how old it is so she will bring it to her next visit so I can examine it. I've advised the patient to use it during the waking hours when she is content to be ambulating or standing on her feet to better control her knee discomfort. I have advised her to especially use it for the next few weeks while she is getting her Euflex injections so we can ensure maximum benefit from the Euflexxa treatment. Patient understands and is agreeable

## 2017-03-01 ENCOUNTER — Ambulatory Visit: Payer: Medicare Other

## 2017-03-02 ENCOUNTER — Ambulatory Visit (INDEPENDENT_AMBULATORY_CARE_PROVIDER_SITE_OTHER): Payer: Medicare Other | Admitting: Rheumatology

## 2017-03-02 DIAGNOSIS — M25561 Pain in right knee: Secondary | ICD-10-CM

## 2017-03-02 DIAGNOSIS — M25562 Pain in left knee: Secondary | ICD-10-CM

## 2017-03-02 DIAGNOSIS — M1711 Unilateral primary osteoarthritis, right knee: Secondary | ICD-10-CM | POA: Diagnosis not present

## 2017-03-02 DIAGNOSIS — M1712 Unilateral primary osteoarthritis, left knee: Secondary | ICD-10-CM | POA: Diagnosis not present

## 2017-03-02 DIAGNOSIS — G8929 Other chronic pain: Secondary | ICD-10-CM | POA: Diagnosis not present

## 2017-03-02 DIAGNOSIS — M170 Bilateral primary osteoarthritis of knee: Secondary | ICD-10-CM

## 2017-03-02 MED ORDER — SODIUM HYALURONATE (VISCOSUP) 20 MG/2ML IX SOSY
20.0000 mg | PREFILLED_SYRINGE | INTRA_ARTICULAR | Status: AC | PRN
  Administered 2017-03-02: 20 mg via INTRA_ARTICULAR

## 2017-03-02 MED ORDER — BUPIVACAINE HCL 0.25 % IJ SOLN
2.0000 mL | INTRAMUSCULAR | Status: AC | PRN
  Administered 2017-03-02: 2 mL via INTRA_ARTICULAR

## 2017-03-02 NOTE — Progress Notes (Signed)
   Procedure Note  Patient: [name] [name]             Date of Birth: [birth year]           MRN: [id number]             Visit Date: 03/02/2017  Procedures: Visit Diagnoses: Primary osteoarthritis of both knees  Chronic pain of both knees  Large Joint Inj Date/Time: 03/02/2017 4:14 PM Performed by: Tawni Pummel Authorized by: Tawni Pummel   Consent Given by:  Patient Site marked: the procedure site was marked   Timeout: prior to procedure the correct patient, procedure, and site was verified   Indications:  Pain and joint swelling Location:  Knee Site:  R knee Prep: patient was prepped and draped in usual sterile fashion   Needle Size:  27 G Needle Length:  1.5 inches Approach:  Medial Ultrasound Guidance: No   Fluoroscopic Guidance: No   Arthrogram: No   Medications:  2 mL bupivacaine 0.25 %; 20 mg Sodium Hyaluronate 20 MG/2ML Aspiration Attempted: Yes   Patient tolerance:  Patient tolerated the procedure well with no immediate complications   Euflex and #2 bilateral knees  BUY-BILL Euflexxa  Large Joint Inj Date/Time: 03/02/2017 4:16 PM Performed by: Tawni Pummel Authorized by: Tawni Pummel   Consent Given by:  Patient Site marked: the procedure site was marked   Timeout: prior to procedure the correct patient, procedure, and site was verified   Indications:  Pain and joint swelling Location:  Knee Site:  L knee Prep: patient was prepped and draped in usual sterile fashion   Needle Size:  27 G Needle Length:  1.5 inches Approach:  Medial Ultrasound Guidance: No   Fluoroscopic Guidance: No   Arthrogram: No   Medications:  2 mL bupivacaine 0.25 %; 20 mg Sodium Hyaluronate 20 MG/2ML Aspiration Attempted: Yes   Patient tolerance:  Patient tolerated the procedure well with no immediate complications   Euflex and #2 bilateral knees  BUY-BILL Euflexxa   Patient has done well with her first Euflexxa. She states that her knees feel better.

## 2017-03-07 ENCOUNTER — Telehealth: Payer: Self-pay | Admitting: Endocrinology

## 2017-03-07 ENCOUNTER — Ambulatory Visit: Payer: Medicare Other | Admitting: Endocrinology

## 2017-03-07 NOTE — Telephone Encounter (Signed)
Patient no showed today's appt. Please advise on how to follow up. °A. No follow up necessary. °B. Follow up urgent. Contact patient immediately. °C. Follow up necessary. Contact patient and schedule visit in ___ days. °D. Follow up advised. Contact patient and schedule visit in ____weeks. ° °

## 2017-03-07 NOTE — Telephone Encounter (Signed)
Follow-up necessary as soon as possible 

## 2017-03-08 NOTE — Telephone Encounter (Signed)
Pt scheduled 5/8

## 2017-03-09 ENCOUNTER — Ambulatory Visit (INDEPENDENT_AMBULATORY_CARE_PROVIDER_SITE_OTHER): Payer: Medicare Other | Admitting: Rheumatology

## 2017-03-09 DIAGNOSIS — G8929 Other chronic pain: Secondary | ICD-10-CM

## 2017-03-09 DIAGNOSIS — M1712 Unilateral primary osteoarthritis, left knee: Secondary | ICD-10-CM | POA: Diagnosis not present

## 2017-03-09 DIAGNOSIS — M25562 Pain in left knee: Secondary | ICD-10-CM | POA: Diagnosis not present

## 2017-03-09 DIAGNOSIS — M25561 Pain in right knee: Secondary | ICD-10-CM

## 2017-03-09 DIAGNOSIS — M1711 Unilateral primary osteoarthritis, right knee: Secondary | ICD-10-CM

## 2017-03-09 DIAGNOSIS — M170 Bilateral primary osteoarthritis of knee: Secondary | ICD-10-CM

## 2017-03-09 MED ORDER — BUPIVACAINE HCL 0.25 % IJ SOLN
2.0000 mL | INTRAMUSCULAR | Status: AC | PRN
  Administered 2017-03-09: 2 mL via INTRA_ARTICULAR

## 2017-03-09 MED ORDER — SODIUM HYALURONATE (VISCOSUP) 20 MG/2ML IX SOSY
20.0000 mg | PREFILLED_SYRINGE | INTRA_ARTICULAR | Status: AC | PRN
  Administered 2017-03-09: 20 mg via INTRA_ARTICULAR

## 2017-03-09 MED ORDER — SODIUM HYALURONATE (VISCOSUP) 20 MG/2ML IX SOSY
20.0000 mg | PREFILLED_SYRINGE | INTRA_ARTICULAR | Status: AC | PRN
Start: 2017-03-09 — End: 2017-03-09
  Administered 2017-03-09: 20 mg via INTRA_ARTICULAR

## 2017-03-09 NOTE — Progress Notes (Signed)
   Procedure Note  Patient: [name] [name]             Date of Birth: 01-Apr-1946           MRN: [id number]             Visit Date: 03/09/2017  Procedures: Visit Diagnoses: No diagnosis found.  Large Joint Inj Date/Time: 03/09/2017 2:04 PM Performed by: Tawni Pummel Authorized by: Tawni Pummel   Consent Given by:  Patient Site marked: the procedure site was marked   Timeout: prior to procedure the correct patient, procedure, and site was verified   Indications:  Pain and joint swelling Location:  Knee Site:  R knee Prep: patient was prepped and draped in usual sterile fashion   Needle Size:  27 G Needle Length:  1.5 inches Approach:  Medial Ultrasound Guidance: No   Fluoroscopic Guidance: No   Arthrogram: No   Medications:  2 mL bupivacaine 0.25 %; 20 mg Sodium Hyaluronate 20 MG/2ML Aspiration Attempted: Yes   Patient tolerance:  Patient tolerated the procedure well with no immediate complications   Euflex and #3 bilateral knees  Patient had 70% improvement after her second injection  This is patient purchased Euflexxa  Large Joint Inj Date/Time: 03/09/2017 2:06 PM Performed by: Tawni Pummel Authorized by: Tawni Pummel   Consent Given by:  Patient Site marked: the procedure site was marked   Timeout: prior to procedure the correct patient, procedure, and site was verified   Indications:  Pain and joint swelling Location:  Knee Site:  L knee Prep: patient was prepped and draped in usual sterile fashion   Needle Size:  27 G Needle Length:  1.5 inches Approach:  Medial Ultrasound Guidance: No   Fluoroscopic Guidance: No   Arthrogram: No   Medications:  2 mL bupivacaine 0.25 %; 20 mg Sodium Hyaluronate 20 MG/2ML Aspiration Attempted: Yes   Patient tolerance:  Patient tolerated the procedure well with no immediate complications  Euflex and #3 bilateral knees  Patient had 70% improvement after her second injection  This is patient purchased  Euflexxa   Note: After the second injection, patient feels 70% improved. I'm hopeful that the third and final injection will care for added relief. xa Note: Due to the shortage of lidocaine, were using bupivacaine 0.25% at 2 mL injected prior to each syringe of Euflex

## 2017-03-11 ENCOUNTER — Encounter: Payer: Medicare Other | Admitting: Dietician

## 2017-03-15 ENCOUNTER — Encounter: Payer: Self-pay | Admitting: Podiatry

## 2017-03-15 ENCOUNTER — Ambulatory Visit (INDEPENDENT_AMBULATORY_CARE_PROVIDER_SITE_OTHER): Payer: Medicare Other | Admitting: Podiatry

## 2017-03-15 DIAGNOSIS — E119 Type 2 diabetes mellitus without complications: Secondary | ICD-10-CM | POA: Diagnosis not present

## 2017-03-15 DIAGNOSIS — B351 Tinea unguium: Secondary | ICD-10-CM

## 2017-03-15 NOTE — Progress Notes (Signed)
Patient ID: [name] [name], female   DOB: [birth year], 71 y.o.   MRN: [id number]    This patient presents today stating that her toenails are elongated and thickened with color changes and have gradually become more difficult for patient to trim in the past several months. Patient denies any recent podiatric care for this problem. Patient is a diabetic and denies any history of ulceration, claudication or amputation  Objective:Orientated 3  Vascular: DP pulses 2/4 bilaterally PT pulses 2/4 bilaterally Capillary reflex immediate bilaterally  Neurological: Sensation to 10 g monofilament wire intact 5/5 bilaterally  Vibratory sensation intact bilaterally Ankle reflex equal and reactive bilaterally  Dermatological: No open skin lesions bilaterally The toenails are elongated incurvated.with texture and color changes 6-10 Absent hair growth bilaterally  Musculoskeletal: Pes planus bilaterally HAV bilaterally Hammertoe second left There is no restriction ankle, subtalar, midtarsal joints bilaterally Prominence dorsal midfoot bony areas palpated bilaterally  Assessment: Satisfactory neurovascular status Diabetic without foot complications Mycotic toenails 6-10  Plan: Debridement of toenails 6-10 mechanically and legs without any bleeding  Recommended reappoint at 3 months, however, patient requested to contact the office rather than schedule

## 2017-03-15 NOTE — Patient Instructions (Signed)

## 2017-03-16 ENCOUNTER — Telehealth: Payer: Self-pay | Admitting: *Deleted

## 2017-03-16 NOTE — Telephone Encounter (Signed)
Patient states she has been having pain in her right knee and has moved to her right hip. Patient states this has been going on for a couple of days. Patient states she is having difficulty sleeping stating that she is having trouble turing in the bed. Patient would like to know what we can do to help relieve her pain or if she should come in to be seen.

## 2017-03-16 NOTE — Telephone Encounter (Signed)
If the knee is not warm, red, or infected, and pt is willing to give it another week to see if it resolves, that would be best.  However, if any of the above is happening, I can find a spot in the schedule for evaluation and treatment. In the meanwhile, OK use voltaren gel, elevate knee, use brace., and when appropriate, try tylenol or ibuprofen.

## 2017-03-16 NOTE — Telephone Encounter (Signed)
Attempted to contact the patient and left message for patient to call the office.  

## 2017-03-16 NOTE — Telephone Encounter (Signed)
Patient states her knee is not warm, red nor infected. Patient states the Voltaren Gel does not work well for her. She states Pennsaid works better but was unable to afford the prescription for it and we doe not have any samples. Patient provided with other recommendations as well and she verbalized understanding.

## 2017-03-16 NOTE — Telephone Encounter (Signed)
She can use [facility] out of [place] and they gaurantee a good price for the pennsaid. I can write for a pump after she calls and verifies the price with them. ( I think the pennsaid rep can get you the number)  Here is website [email or web address]

## 2017-03-18 ENCOUNTER — Encounter: Payer: Self-pay | Admitting: Internal Medicine

## 2017-03-18 ENCOUNTER — Ambulatory Visit (INDEPENDENT_AMBULATORY_CARE_PROVIDER_SITE_OTHER): Payer: Medicare Other | Admitting: Internal Medicine

## 2017-03-18 DIAGNOSIS — K3184 Gastroparesis: Secondary | ICD-10-CM

## 2017-03-18 NOTE — Progress Notes (Deleted)
Pre visit review using our clinic review tool, if applicable. No additional management support is needed unless otherwise documented below in the visit note. 

## 2017-03-21 ENCOUNTER — Ambulatory Visit (INDEPENDENT_AMBULATORY_CARE_PROVIDER_SITE_OTHER): Payer: Medicare Other | Admitting: Rheumatology

## 2017-03-21 ENCOUNTER — Encounter: Payer: Self-pay | Admitting: Rheumatology

## 2017-03-21 VITALS — BP 137/67 | HR 91 | Resp 15 | Ht 62.0 in | Wt 130.0 lb

## 2017-03-21 DIAGNOSIS — Z8679 Personal history of other diseases of the circulatory system: Secondary | ICD-10-CM

## 2017-03-21 DIAGNOSIS — M1120 Other chondrocalcinosis, unspecified site: Secondary | ICD-10-CM

## 2017-03-21 DIAGNOSIS — M7061 Trochanteric bursitis, right hip: Secondary | ICD-10-CM

## 2017-03-21 DIAGNOSIS — G8929 Other chronic pain: Secondary | ICD-10-CM | POA: Diagnosis not present

## 2017-03-21 DIAGNOSIS — M19072 Primary osteoarthritis, left ankle and foot: Secondary | ICD-10-CM

## 2017-03-21 DIAGNOSIS — Z8739 Personal history of other diseases of the musculoskeletal system and connective tissue: Secondary | ICD-10-CM | POA: Diagnosis not present

## 2017-03-21 DIAGNOSIS — Z8639 Personal history of other endocrine, nutritional and metabolic disease: Secondary | ICD-10-CM | POA: Diagnosis not present

## 2017-03-21 DIAGNOSIS — M170 Bilateral primary osteoarthritis of knee: Secondary | ICD-10-CM | POA: Diagnosis not present

## 2017-03-21 DIAGNOSIS — M19042 Primary osteoarthritis, left hand: Secondary | ICD-10-CM | POA: Diagnosis not present

## 2017-03-21 DIAGNOSIS — M19041 Primary osteoarthritis, right hand: Secondary | ICD-10-CM

## 2017-03-21 DIAGNOSIS — M19071 Primary osteoarthritis, right ankle and foot: Secondary | ICD-10-CM | POA: Diagnosis not present

## 2017-03-21 DIAGNOSIS — M25561 Pain in right knee: Secondary | ICD-10-CM | POA: Diagnosis not present

## 2017-03-21 DIAGNOSIS — Z8719 Personal history of other diseases of the digestive system: Secondary | ICD-10-CM | POA: Diagnosis not present

## 2017-03-21 DIAGNOSIS — M8589 Other specified disorders of bone density and structure, multiple sites: Secondary | ICD-10-CM | POA: Diagnosis not present

## 2017-03-21 MED ORDER — TRIAMCINOLONE ACETONIDE 40 MG/ML IJ SUSP
40.0000 mg | INTRAMUSCULAR | Status: AC | PRN
  Administered 2017-03-21: 40 mg via INTRA_ARTICULAR

## 2017-03-21 MED ORDER — LIDOCAINE HCL (PF) 1 % IJ SOLN
2.0000 mL | INTRAMUSCULAR | Status: AC | PRN
Start: 2017-03-21 — End: 2017-03-21
  Administered 2017-03-21: 2 mL

## 2017-03-21 MED ORDER — LIDOCAINE HCL (PF) 1 % IJ SOLN
2.0000 mL | INTRAMUSCULAR | Status: AC | PRN
  Administered 2017-03-21: 2 mL

## 2017-03-21 NOTE — Progress Notes (Signed)
Office Visit Note  Patient: [name] [name]             Date of Birth: 1946-09-12           MRN: [id number]             PCP: Wanda Plump, MD Referring: Wanda Plump, MD Visit Date: 03/21/2017 Occupation: @GUAROCC @    Subjective:  Right hip pain   History of Present Illness: [name] [name] is a 71 y.o. female with a history of osteoarthritis and pseudogout.  Patient states she has pain in her right hip.  Patient states this pain started 3 weeks ago.  She states she was seen at 2 different urgent cares and received a shot intramuscularly for pain relief..  Patient states the pain is getting progressively worse.   She states the pain is 8 out of 10.  Patient states the pain is worse when she rises from a seated position.  She states she took ibuprofen, which has helped reliever her pain.  She states she is also having right knee pain and swelling.  Denies any other joint swelling.  Patient does not remember her last pseudogout flare. She takes colchicine on when necessary basis. She denies any recent colchicine intake.  Activities of Daily Living:  Patient reports morning stiffness for 0 minutes.   Patient Denies nocturnal pain.  Difficulty dressing/grooming: Denies Difficulty climbing stairs: Reports Difficulty getting out of chair: Reports Difficulty using hands for taps, buttons, cutlery, and/or writing: Denies   Review of Systems  Constitutional: Positive for appetite change and weight loss. Negative for fatigue, weight gain and weakness.  HENT: Negative for mouth sores, mouth dryness and nose dryness.   Eyes: Negative for pain, redness and dryness.  Respiratory: Negative for cough, shortness of breath and difficulty breathing.   Cardiovascular: Negative for chest pain, palpitations, hypertension, irregular heartbeat and swelling in legs/feet.  Gastrointestinal: Negative for blood in stool, constipation and diarrhea.  Genitourinary: Negative for painful urination.    Musculoskeletal: Positive for arthralgias, joint pain and joint swelling. Negative for myalgias, morning stiffness, muscle tenderness and myalgias.  Skin: Negative for color change, rash, nodules/bumps, skin tightness and ulcers.  Hematological: Negative for swollen glands.  Psychiatric/Behavioral: Negative for depressed mood and sleep disturbance. The patient is not nervous/anxious.     PMFS History:  Patient Active Problem List   Diagnosis Date Noted  . 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 History of hypertension 12/08/2016  . History of diabetes mellitus 12/08/2016  . Dyslipidemia 12/08/2016  . Osteoarthritis of both hands 09/14/2016  . Osteoarthritis of both feet 09/14/2016  . Osteoarthritis of both knees 09/14/2016  . PCP NOTES >>> 08/19/2015  . Low back pain 09/09/2014  . Neuralgia of left saphenous nerve 06/05/2014  . Numbness and tingling in left arm 06/05/2014  . Paresthesia of left arm 05/21/2014  . Gastroparesis 02/20/2013  . Insomnia 02/20/2013  . Annual physical exam 03/31/2012  . History of diverticulitis 04/14/2010  . Osteopenia 05/14/2009  . DYSPHAGIA UNSPECIFIED 01/10/2009  . 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 Hyperlipidemia 06/14/2007  . Essential hypertension 06/14/2007  . Osteoarthritis 06/14/2007  . DM (diabetes mellitus) (HCC) 01/02/2007    Past Medical History:  Diagnosis Date  . Bilateral knee pain    Euflexxa x3  . Chest pain 06/2007   CP and QW on EKG: neg stress test   . Diabetes mellitus    w/ neuropathy  . Diverticulitis 2000  . Hyperlipidemia   .  Hypertension   . Insomnia 02/20/2013  . Osteoarthritis    gout, CPPD--- sees Dr.Davenshwar  . Osteoarthritis of both feet 09/14/2016  . Osteoarthritis of both hands 09/14/2016  . Osteoarthritis of both knees 09/14/2016  . Osteopenia    per DEXA 4/10  . Recurrent boils    h/o MRSA    Family History  Problem Relation Age of Onset  . Hypertension Mother   . Diabetes Mother   . Hypertension Father    . Diabetes Father   . Hypertension Daughter   . Hypertension Sister   . Diabetes Sister   . Coronary artery disease Neg Hx   . Stroke Neg Hx   . Colon cancer Neg Hx   . Breast cancer Neg Hx    Past Surgical History:  Procedure Laterality Date  . CHOLECYSTECTOMY    . TONSILLECTOMY     Social History   Social History Narrative   Lives w/ husband   Patient is married with 3 children.   Patient is right handed.   Patient has college education.   Patient drinks 3-4 daily.     Objective: Vital Signs: BP 137/67 (BP Location: Left Arm, Patient Position: Sitting, Cuff Size: Normal)   Pulse 91   Resp 15   Ht 5\' 2"  (1.575 m)   Wt 130 lb (59 kg)   BMI 23.78 kg/m    Physical Exam  Constitutional: She is oriented to person, place, and time. She appears well-developed and well-nourished.  HENT:  Head: Normocephalic and atraumatic.  Eyes: Conjunctivae and EOM are normal.  Neck: Normal range of motion.  Cardiovascular: Normal rate, regular rhythm, normal heart sounds and intact distal pulses.   Pulmonary/Chest: Effort normal and breath sounds normal.  Abdominal: Soft. Bowel sounds are normal.  Lymphadenopathy:    She has no cervical adenopathy.  Neurological: She is alert and oriented to person, place, and time.  Skin: Skin is warm and dry. Capillary refill takes less than 2 seconds.  Psychiatric: She has a normal mood and affect. Her behavior is normal.  Nursing note and vitals reviewed.    Musculoskeletal Exam: C-spine some limitation with range of motion lumbar spine limited range of motion. Shoulder joints elbow joints are good range of motion. She severe DIP PIP thickening bilaterally with no synovitis consistent with osteoarthritis. She had discomfort range of motion of her right hip joint with tenderness over right trochanteric bursa. These findings were consistent with trochanteric bursitis she also had warmth and swelling in her right knee joint with minimal effusion.  Although joints afford range of motion with no synovitis or synovial thickening. She does have osteoarthritic changes in her feet.  CDAI Exam: No CDAI exam completed.    Investigation: No additional findings.   CBC Latest Ref Rng & Units 02/08/2017 12/09/2016 04/02/2016  WBC 4.0 - 10.5 K/uL 8.3 7.2 5.3  Hemoglobin 12.0 - 15.0 g/dL 69.6 29.5 28.4  Hematocrit 36.0 - 46.0 % 39.4 40.2 37  Platelets 150.0 - 400.0 K/uL 290.0 304 309    CMP Latest Ref Rng & Units 02/08/2017 12/09/2016 07/12/2016  Glucose 70 - 99 mg/dL 132(G) 401(U) 272(Z)  BUN 6 - 23 mg/dL 6 10 9   Creatinine 0.40 - 1.20 mg/dL 3.66 4.40 3.47  Sodium 135 - 145 mEq/L 137 138 140  Potassium 3.5 - 5.1 mEq/L 4.0 4.2 3.5  Chloride 96 - 112 mEq/L 102 102 105  CO2 19 - 32 mEq/L 27 22 26   Calcium 8.4 - 10.5 mg/dL 9.9  9.7 9.3  Total Protein 6.0 - 8.3 g/dL 7.3 6.7 -  Total Bilirubin 0.2 - 1.2 mg/dL 0.4 0.5 -  Alkaline Phos 39 - 117 U/L 74 73 -  AST 0 - 37 U/L 13 11 -  ALT 0 - 35 U/L 23 12 -   Imaging: No results found.  Speciality Comments: No specialty comments available.    Procedures:  Large Joint Inj Date/Time: 03/21/2017 11:17 AM Performed by: Pollyann SavoyEVESHWAR, Ascencion Coye Authorized by: Pollyann SavoyEVESHWAR, Kiersten Coss   Consent Given by:  Patient Site marked: the procedure site was marked   Timeout: prior to procedure the correct patient, procedure, and site was verified   Indications:  Pain Location:  Hip Site:  R greater trochanter Prep: patient was prepped and draped in usual sterile fashion   Needle Size:  27 G Needle Length:  1.5 inches Approach:  Lateral Ultrasound Guidance: No   Fluoroscopic Guidance: No   Arthrogram: No   Medications:  40 mg triamcinolone acetonide 40 MG/ML; 2 mL lidocaine (PF) 1 % Aspiration Attempted: No   Aspirate amount (mL):  0 Patient tolerance:  Patient tolerated the procedure well with no immediate complications Large Joint Inj Date/Time: 03/21/2017 11:17 AM Performed by: Pollyann SavoyEVESHWAR, Doug Bucklin Authorized  by: Pollyann SavoyEVESHWAR, Kourtnei Rauber   Consent Given by:  Patient Site marked: the procedure site was marked   Timeout: prior to procedure the correct patient, procedure, and site was verified   Indications:  Pain and joint swelling Location:  Knee Site:  R knee Prep: patient was prepped and draped in usual sterile fashion   Needle Size:  27 G Needle Length:  1.5 inches Approach:  Medial Ultrasound Guidance: No   Fluoroscopic Guidance: No   Arthrogram: No   Medications:  40 mg triamcinolone acetonide 40 MG/ML; 2 mL lidocaine (PF) 1 % Aspiration Attempted: Yes   Aspirate amount (mL):  0 Patient tolerance:  Patient tolerated the procedure well with no immediate complications   Allergies: Colcrys [colchicine]; Hydrocodone; Sulindac; Tramadol; Celecoxib; and Glimepiride   Assessment / Plan:     Visit Diagnoses: Primary osteoarthritis of both knees: She has chronic discomfort and pain in her knee joints.  CPPD knees hands: She also has CPPD flares intermittently. She's not been taking colchicine on regular basis. I've advised her to take it on when necessary basis but to use it when she has a flare.  Chronic pain of right knee: Her right knee joint is causing a lot of discomfort today she is warmth and swelling in her right knee joint. She is having difficulty walking. After informed consent was obtained the right knee joint was injected as described above.  Trochanteric bursitis of right hip: She is also having a lot of discomfort in her right trochanteric bursa. After informed consent was obtained right trochanteric bursa was also injected as described above.  Primary osteoarthritis of both feet: She uses proper fitting shoes.  Primary osteoarthritis of both hands she has severe osteoarthritis but not much discomfort  History of diverticulitis  History of diabetes mellitus  History of hypertension  Osteopenia of multiple sites  Pseudogout and DJD    Orders: Orders Placed This Encounter    Procedures  . Large Joint Injection/Arthrocentesis  . Large Joint Injection/Arthrocentesis   No orders of the defined types were placed in this encounter.   Face-to-face time spent with patient was 30 minutes. 50% of time was spent in counseling and coordination of care.  Follow-Up Instructions: Return in about 6 months (around 09/21/2017) for  OA, CPPD.   Pollyann Savoy, MD  Note - This record has been created using Animal nutritionist.  Chart creation errors have been sought, but may not always  have been located. Such creation errors do not reflect on  the standard of medical care.

## 2017-03-21 NOTE — Telephone Encounter (Signed)
Patient was seen in urgent care over the weekend and has fluid on her knee. She was instructed to make a follow up appointment with us. Patient has been schedule for 03/21/17 at 10:30 am.

## 2017-03-22 ENCOUNTER — Ambulatory Visit: Payer: Medicare Other | Admitting: Endocrinology

## 2017-03-23 NOTE — Progress Notes (Signed)
No show

## 2017-04-20 ENCOUNTER — Encounter: Payer: Self-pay | Admitting: Endocrinology

## 2017-04-20 ENCOUNTER — Ambulatory Visit (INDEPENDENT_AMBULATORY_CARE_PROVIDER_SITE_OTHER): Payer: Medicare Other | Admitting: Endocrinology

## 2017-04-20 VITALS — BP 132/80 | HR 71 | Ht 62.4 in | Wt 128.2 lb

## 2017-04-20 DIAGNOSIS — E1165 Type 2 diabetes mellitus with hyperglycemia: Secondary | ICD-10-CM

## 2017-04-20 LAB — GLUCOSE, POCT (MANUAL RESULT ENTRY): POC GLUCOSE: 250 mg/dL — AB (ref 70–99)

## 2017-04-20 LAB — POCT GLYCOSYLATED HEMOGLOBIN (HGB A1C): HEMOGLOBIN A1C: 9.9

## 2017-04-20 MED ORDER — GLIPIZIDE ER 5 MG PO TB24: 5.0000 mg | ORAL_TABLET | Freq: Every day | ORAL | 1 refills | Status: DC

## 2017-04-20 MED ORDER — INSULIN ISOPHANE & REGULAR (HUMAN 70-30)100 UNIT/ML KWIKPEN: PEN_INJECTOR | SUBCUTANEOUS | 1 refills | Status: DC

## 2017-04-20 NOTE — Patient Instructions (Addendum)
No sweet drinks   Check blood sugars on waking up at least every other day   Also check blood sugars about 2 hours after a meal and do this after different meals by rotation  Most important to check sugars after supper  Recommended blood sugar levels on waking up is 90-130 and about 2 hours after meal is 130-160  Please bring your blood sugar monitor to each visit, thank you  INSULIN: Take this right before eating supper  Tonight start taking 6 units before supper After 3 days if the morning and bedtime sugars are still over 150 increase the dose to 8 units Next week if morning and bedtime sugars still over 150, increase the dose to 10 units

## 2017-04-20 NOTE — Progress Notes (Signed)
Patient ID: [name] [name], female   DOB: 1946/06/24, 71 y.o.   MRN: [id number]            Reason for Appointment: F/u for Type 2 Diabetes  Referring Physician: Drue Novel   History of Present Illness:          Date of diagnosis of type 2 diabetes mellitus: 2008 ?        Background history:   Not clear how she was diagnosed initially but she was having symptoms of frequent urination She has taken mostly metformin for her treatment since the diagnosis Overall has tried several medications in the past including Januvia, Onglyza, Actos and acarbose Her control had been generally fairly good with A1c around 7% since about 2013 Unclear from her history and office notes why her medications have been changed over the last 3 years She may have had some swelling of her legs with Actos  Recent history:        Non-insulin hypoglycemic drugs the patient is taking are: Janumet XR 50/500, 2 tablets daily  Her A1c is 9.9, previously 9.2  Current management, blood sugar patterns and problems identified:  She did not bring her monitor for download, was given a One Touch Verio monitor on her last visit  Also she did not come back for follow-up a month later as instructed  Since she was having high readings on metformin and having GI problems she was told to stop regular metformin and acarbose and was started on Janumet and glipizide  However she somehow did not get her prescription from glipizide  She was also instructed on checking her sugars at various times of the day but she is still checking them in the morning  She thinks her sugars were relatively better for sometime but more recently there have been higher and around 200  Fingerstick glucose in the office today was 250  She is not having any side effects from Janumet XR No recent GI problems  Despite his question she is still not cutting back on drinks with sugar which she has regularly every day and some at bedtime also  She was told  to cut back on snacks like cookies and chips but she is not completely stopping these      Side effects from medications have been:?  Edema from Actos.?  Rash from Amaryl   Compliance with the medical regimen: Inadequate  Glucose monitoring:  done 1  times a day         Glucometer: One Touch ultra mini .      Blood Glucose readings by recall:  FBS 144-220  Self-care:    Meal times are quite variable  Sweet drinks or ginger ale, kool aid regularly, 3-4/day including regular soft drinks and juice Typical meal intake: Breakfast is oatmeal usually, eating at 10-11 AM.  Evening meal is usually 6-7 PM She will mostly meat and vegetables in the evenings  Snacks will be cookies, potato chips and yogurt               Dietician visit, most recent:?  2015               Exercise: Limited by fatigue and knee pain, just walking around the house  Weight history:  Wt Readings from Last 3 Encounters:  04/20/17 128 lb 3.2 oz (58.2 kg)  03/21/17 130 lb (59 kg)  02/18/17 132 lb 8 oz (60.1 kg)    Glycemic control:   Lab Results  Component Value Date   HGBA1C 9.9 04/20/2017   HGBA1C 9.2 (H) 01/14/2017   HGBA1C 7.0 (H) 07/12/2016   Lab Results  Component Value Date   MICROALBUR 1.0 01/14/2017   LDLCALC 83 03/02/2016   CREATININE 0.62 02/08/2017   Lab Results  Component Value Date   MICRALBCREAT 5 01/14/2017    No results found for: FRUCTOSAMINE    Allergies as of 04/20/2017      Reactions   Colcrys [colchicine] Nausea Only   Hydrocodone    Sulindac    Tramadol    "Made me feel like I was in another world."   Celecoxib Rash   Glimepiride Rash      Medication List       Accurate as of 04/20/17 12:41 PM. Always use your most recent med list.          ALPRAZolam 0.5 MG tablet Commonly known as:  XANAX Take 0.5-1 tablets (0.25-0.5 mg total) by mouth at bedtime as needed for sleep.   aspirin 81 MG tablet Take 81 mg by mouth daily.   CLARITIN PO Take 10 mg by mouth  daily as needed.   colchicine 0.6 MG tablet 1 tablet by mouth daily when necessary   diltiazem 300 MG 24 hr capsule Commonly known as:  CARDIZEM CD Take 1 capsule (300 mg total) by mouth daily.   DULoxetine 30 MG capsule Commonly known as:  CYMBALTA Take 30 mg by mouth daily.   fluticasone 50 MCG/ACT nasal spray Commonly known as:  FLONASE Place 2 sprays into both nostrils daily.   glipiZIDE 5 MG 24 hr tablet Commonly known as:  GLUCOTROL XL Take 1 tablet (5 mg total) by mouth daily with breakfast.   glucose blood test strip Commonly known as:  ONETOUCH VERIO Use to test blood sugar once daily   Insulin Isophane & Regular Human (70-30) 100 UNIT/ML PEN Commonly known as:  HUMULIN 70/30 KWIKPEN 10 units at supper daily   JANUMET XR 50-500 MG Tb24 Generic drug:  SitaGLIPtin-MetFORMIN HCl Take 2 tablets by mouth daily.   metoCLOPramide 10 MG tablet Commonly known as:  REGLAN Take 10 mg by mouth 4 (four) times daily.   OMEGA 3 PO Take 1 tablet by mouth daily. Reported on 11/18/2015   PENNSAID 1.5 % Soln Generic drug:  Diclofenac Sodium Place onto the skin.   Diclofenac Sodium 2 % Soln Commonly known as:  PENNSAID Apply 2 pumps to each knee BID.   pravastatin 20 MG tablet Commonly known as:  PRAVACHOL Take 1 tablet (20 mg total) by mouth at bedtime.   TURMERIC PO Take 1 tablet by mouth daily. OTC   Vitamin D 2000 units Caps Take 2,000 Units by mouth daily. Reported on 11/18/2015       Allergies:  Allergies  Allergen Reactions  . Colcrys [Colchicine] Nausea Only  . Hydrocodone   . Sulindac   . Tramadol     "Made me feel like I was in another world."  . Celecoxib Rash  . Glimepiride Rash    Past Medical History:  Diagnosis Date  . Bilateral knee pain    Euflexxa x3  . Chest pain 06/2007   CP and QW on EKG: neg stress test   . Diabetes mellitus    w/ neuropathy  . Diverticulitis 2000  . Hyperlipidemia   . Hypertension   . Insomnia 02/20/2013  .  Osteoarthritis    gout, CPPD--- sees Dr.Davenshwar  . Osteoarthritis of both feet 09/14/2016  .  Osteoarthritis of both hands 09/14/2016  . Osteoarthritis of both knees 09/14/2016  . Osteopenia    per DEXA 4/10  . Recurrent boils    h/o MRSA    Past Surgical History:  Procedure Laterality Date  . CHOLECYSTECTOMY    . TONSILLECTOMY      Family History  Problem Relation Age of Onset  . Hypertension Mother   . Diabetes Mother   . Hypertension Father   . Diabetes Father   . Hypertension Daughter   . Hypertension Sister   . Diabetes Sister   . Coronary artery disease Neg Hx   . Stroke Neg Hx   . Colon cancer Neg Hx   . Breast cancer Neg Hx     Social History:  reports that she has never smoked. She has never used smokeless tobacco. She reports that she does not drink alcohol or use drugs.   Review of Systems   Lipid history: On Pravachol 20 mg with last lipids as follows:    Lab Results  Component Value Date   CHOL 184 03/02/2016   HDL 90.20 03/02/2016   LDLCALC 83 03/02/2016   LDLDIRECT 111.1 02/20/2013   TRIG 54.0 03/02/2016   CHOLHDL 2 03/02/2016           Hypertension:Currently taking diltiazem Only  Most recent eye exam was 12/17  Most recent foot exam:01/2017     Physical Examination:  BP 132/80   Pulse 71   Ht 5' 2.4" (1.585 m)   Wt 128 lb 3.2 oz (58.2 kg)   SpO2 98%   BMI 23.15 kg/m    ASSESSMENT:  Diabetes type 2, uncontrolled     See history of present illness for detailed discussion of current diabetes management, blood sugar patterns and problems identified  Patient has had recently worsening of her control with A1c 9.9 Although she does not remember readings well and did not bring her meter her glucose is 250 daily without breakfast She appears to have progressive insulin deficiency and not benefiting from Janumet XR Because of her BMI of only 23 she is likely to be insulin deficient Explained the need for controlling her  progressive hyperglycemia with insulin today The patient is somewhat needle phobic and has been reluctant to consider this  However after much explanation and demonstration of the insulin pen and how it would be used she finally agrees to do this Since she likely has high postprandial readings especially with her poor diet will start with a mixed insulin at suppertime for now   PLAN:     She will stop drinking all regular soft drinks and juices  She needs to cut back on high-fat snacks  Needs to have some protein at each meal  She will see the dietitian for formal meal planning  She will start with 6 units of 70/30 insulin before supper and titrate every 3 days by 2 units unless the blood sugar at bedtime or fasting is below 150  Discussed timing of insulin, sites of injection, site rotation, technique of injection with the pen and disposal of the needles  She will follow-up with nurse educator next week  Reminded her to start checking blood sugars consistently at least twice a day now, discussed blood sugar targets  She related to start walking for exercise  If her blood sugars are higher during the day she will need to take insulin twice a day  She will follow-up in one month  She will continue Janumet XR  Patient Instructions  No sweet drinks   Check blood sugars on waking up at least every other day   Also check blood sugars about 2 hours after a meal and do this after different meals by rotation  Most important to check sugars after supper  Recommended blood sugar levels on waking up is 90-130 and about 2 hours after meal is 130-160  Please bring your blood sugar monitor to each visit, thank you  INSULIN: Take this right before eating supper  Tonight start taking 6 units before supper After 3 days if the morning and bedtime sugars are still over 150 increase the dose to 8 units Next week if morning and bedtime sugars still over 150, increase the dose to 10  units     Counseling time on subjects discussed above is over 50% of today's 25 minute visit  Tiwana Chavis 04/20/2017, 12:41 PM   Note: This office note was prepared with Insurance underwriterDragon voice recognition system technology. Any transcriptional errors that result from this process are unintentional.

## 2017-04-26 ENCOUNTER — Encounter: Payer: Medicare Other | Admitting: Internal Medicine

## 2017-05-11 ENCOUNTER — Ambulatory Visit: Payer: Medicare Other | Admitting: Endocrinology

## 2017-05-11 DIAGNOSIS — Z0289 Encounter for other administrative examinations: Secondary | ICD-10-CM

## 2017-05-13 ENCOUNTER — Ambulatory Visit: Payer: Medicare Other | Admitting: Dietician

## 2017-06-08 ENCOUNTER — Ambulatory Visit: Payer: Medicare Other | Admitting: Rheumatology

## 2017-06-10 ENCOUNTER — Encounter: Payer: Medicare Other | Admitting: Internal Medicine

## 2017-06-14 ENCOUNTER — Ambulatory Visit (INDEPENDENT_AMBULATORY_CARE_PROVIDER_SITE_OTHER): Payer: Medicare Other | Admitting: Endocrinology

## 2017-06-14 ENCOUNTER — Encounter: Payer: Self-pay | Admitting: Endocrinology

## 2017-06-14 VITALS — BP 140/76 | HR 91 | Ht 62.0 in | Wt 134.4 lb

## 2017-06-14 DIAGNOSIS — E1165 Type 2 diabetes mellitus with hyperglycemia: Secondary | ICD-10-CM

## 2017-06-14 LAB — GLUCOSE, POCT (MANUAL RESULT ENTRY): POC GLUCOSE: 184 mg/dL — AB (ref 70–99)

## 2017-06-14 MED ORDER — GLIPIZIDE 5 MG PO TABS: ORAL_TABLET | ORAL | 1 refills | Status: DC

## 2017-06-14 NOTE — Patient Instructions (Addendum)
Check blood sugars on waking up  2/7 days  Also check blood sugars about 2 hours after a meal and do this after different meals by rotation  Recommended blood sugar levels on waking up is 90-130 and about 2 hours after meal is 130-160  Please bring your blood sugar monitor to each visit, thank you  Please use your own monitor and not shared   At breakfast have a protein such as low-fat cheese, egg or low-fat high-protein yogurt with only a small amount of cereal  Continue to avoid sweet drinks and have only small portions of sweets or fried food  Start taking GLIPIZIDE 5 mg, half tablet just before eating breakfast and supper

## 2017-06-14 NOTE — Progress Notes (Signed)
Patient ID: [name] [name], female   DOB: 07/18/1946, 71 y.o.   MRN: [id number]            Reason for Appointment: F/u for Type 2 Diabetes  Primary care Physician: Allyne Gee   History of Present Illness:          Date of diagnosis of type 2 diabetes mellitus: 2008 ?        Background history:   Not clear how she was diagnosed initially but she was having symptoms of frequent urination She has taken mostly metformin for her treatment since the diagnosis Overall has tried several medications in the past including Januvia, Onglyza, Actos and acarbose Her control had been generally fairly good with A1c around 7% since about 2013 Unclear from her history and office notes why her medications have been changed over the last 3 years She may have had some swelling of her legs with Actos  Recent history:        Non-insulin hypoglycemic drugs the patient is taking are: Janumet XR 50/500, twice a day  Her A1c in June is 9.9, previously 9.2  Current management, blood sugar patterns and problems identified:  She did not bring her monitor for download again, was given a One Touch Verio monitor previously and is apparently using this along with her husband  She was supposed to start insulin on her last visit but she did not and does not know why  She was also supposed to see the dietitian and she did not keep her appointment  She thinks her blood sugars are fairly good but his taking them only in the mornings instead of after meals also and does not remember what her readings are  Glucose in the office today is high and 80 for several hours after eating cereal  She is mostly eating cereal in the morning and sometimes oatmeal without any protein  However she has done better with cutting back on sugary drinks and eating less cookies and snacks like potato chips  She was seen by a new primary care physician last month and apparently her glucose was 96, not clear what time of the day this  was  She is not having any side effects from Janumet XR No recent GI problems  Her weight has gone up tics pounds      Side effects from medications have been:?  Edema from Actos.?  Rash from Amaryl.  Abdominal discomfort from metformin   Compliance with the medical regimen: Inadequate  Glucose monitoring:  done 1  times a day         Glucometer: One Touch ultra mini .      Blood Glucose readings as above  Self-care:    Meal times are quite variable Less sweet drinks  Typical meal intake: Breakfast is cereal/oatmeal  usually, eating at 10-11 AM.  Evening meal is usually 6-7 PM She will mostly meat and vegetables in the evenings  Snacks will be some cookies, potato chips and yogurt               Dietician visit, most recent:?  2015               Exercise: Limited by fatigue and knee pain, just walking around the house  Weight history:  Wt Readings from Last 3 Encounters:  06/14/17 134 lb 6.4 oz (61 kg)  04/20/17 128 lb 3.2 oz (58.2 kg)  03/21/17 130 lb (59 kg)    Glycemic control:  Lab Results  Component Value Date   HGBA1C 9.9 04/20/2017   HGBA1C 9.2 (H) 01/14/2017   HGBA1C 7.0 (H) 07/12/2016   Lab Results  Component Value Date   MICROALBUR 1.0 01/14/2017   LDLCALC 83 03/02/2016   CREATININE 0.62 02/08/2017   Lab Results  Component Value Date   MICRALBCREAT 5 01/14/2017    No results found for: FRUCTOSAMINE    Allergies as of 06/14/2017      Reactions   Colcrys [colchicine] Nausea Only   Hydrocodone    Sulindac    Tramadol    "Made me feel like I was in another world."   Celecoxib Rash   Glimepiride Rash      Medication List       Accurate as of 06/14/17  3:02 PM. Always use your most recent med list.          ALPRAZolam 0.5 MG tablet Commonly known as:  XANAX Take 0.5-1 tablets (0.25-0.5 mg total) by mouth at bedtime as needed for sleep.   aspirin 81 MG tablet Take 81 mg by mouth daily.   CLARITIN PO Take 10 mg by mouth daily as  needed.   colchicine 0.6 MG tablet 1 tablet by mouth daily when necessary   diltiazem 300 MG 24 hr capsule Commonly known as:  CARDIZEM CD Take 1 capsule (300 mg total) by mouth daily.   DULoxetine 30 MG capsule Commonly known as:  CYMBALTA Take 30 mg by mouth daily.   fluticasone 50 MCG/ACT nasal spray Commonly known as:  FLONASE Place 2 sprays into both nostrils daily.   glipiZIDE 5 MG tablet Commonly known as:  GLUCOTROL Half tablet before breakfast and half tablet before supper   glucose blood test strip Commonly known as:  ONETOUCH VERIO Use to test blood sugar once daily   JANUMET XR 50-500 MG Tb24 Generic drug:  SitaGLIPtin-MetFORMIN HCl Take 2 tablets by mouth daily.   metoCLOPramide 10 MG tablet Commonly known as:  REGLAN Take 10 mg by mouth 4 (four) times daily.   OMEGA 3 PO Take 1 tablet by mouth daily. Reported on 11/18/2015   PENNSAID 1.5 % Soln Generic drug:  Diclofenac Sodium Place onto the skin.   pravastatin 20 MG tablet Commonly known as:  PRAVACHOL Take 1 tablet (20 mg total) by mouth at bedtime.   TURMERIC PO Take 1 tablet by mouth daily. OTC   Vitamin D (Ergocalciferol) 50000 units Caps capsule Commonly known as:  DRISDOL Take 50,000 Units by mouth 2 (two) times a week. Takes on Tuesdays and Fridays   Vitamin D 2000 units Caps Take 2,000 Units by mouth daily. Reported on 11/18/2015       Allergies:  Allergies  Allergen Reactions  . Colcrys [Colchicine] Nausea Only  . Hydrocodone   . Sulindac   . Tramadol     "Made me feel like I was in another world."  . Celecoxib Rash  . Glimepiride Rash    Past Medical History:  Diagnosis Date  . Bilateral knee pain    Euflexxa x3  . Chest pain 06/2007   CP and QW on EKG: neg stress test   . Diabetes mellitus    w/ neuropathy  . Diverticulitis 2000  . Hyperlipidemia   . Hypertension   . Insomnia 02/20/2013  . Osteoarthritis    gout, CPPD--- sees Dr.Davenshwar  . Osteoarthritis of both  feet 09/14/2016  . Osteoarthritis of both hands 09/14/2016  . Osteoarthritis of both knees 09/14/2016  .  Osteopenia    per DEXA 4/10  . Recurrent boils    h/o MRSA    Past Surgical History:  Procedure Laterality Date  . CHOLECYSTECTOMY    . TONSILLECTOMY      Family History  Problem Relation Age of Onset  . Hypertension Mother   . Diabetes Mother   . Hypertension Father   . Diabetes Father   . Hypertension Daughter   . Hypertension Sister   . Diabetes Sister   . Coronary artery disease Neg Hx   . Stroke Neg Hx   . Colon cancer Neg Hx   . Breast cancer Neg Hx     Social History:  reports that she has never smoked. She has never used smokeless tobacco. She reports that she does not drink alcohol or use drugs.   Review of Systems   Lipid history: On Pravachol 20 mg with last lipidsDone by PCP and LDL 101    Lab Results  Component Value Date   CHOL 184 03/02/2016   HDL 90.20 03/02/2016   LDLCALC 83 03/02/2016   LDLDIRECT 111.1 02/20/2013   TRIG 54.0 03/02/2016   CHOLHDL 2 03/02/2016           Hypertension:Currently taking diltiazem   Most recent eye exam was 12/17  Most recent foot exam:01/2017     Physical Examination:  BP 140/76   Pulse 91   Ht 5\' 2"  (1.575 m)   Wt 134 lb 6.4 oz (61 kg)   SpO2 98%   BMI 24.58 kg/m    ASSESSMENT:  Diabetes type 2, uncontrolled     See history of present illness for detailed discussion of current diabetes management, blood sugar patterns and problems identified  Last A1c 9.9  Although her blood sugars are looking better compared to her last visit when of his glucose was 258 and now 184 She may be doing better with improving her diet However no objective assessment of her diabetes is available today She was recommended insulin with she did not start this because she does not like needles Also does not like to add brand name medications   PLAN:     She will start checking her blood sugars more regularly  with her own exclusive meter and bring this for download on each visit  She needs to start checking some readings after meals and less in the morning  Reschedule appointment with dietitian  Discussed how to add protein at breakfast  She will add glipizide 2.5 mg before breakfast and supper since she is reportedly allergic to Amaryl  She will follow-up in one month  She will continue Janumet XR  Consider increasing pravastatin on her next visit    Patient Instructions  Check blood sugars on waking up  2/7 days  Also check blood sugars about 2 hours after a meal and do this after different meals by rotation  Recommended blood sugar levels on waking up is 90-130 and about 2 hours after meal is 130-160  Please bring your blood sugar monitor to each visit, thank you  Please use your own monitor and not shared   At breakfast have a protein such as low-fat cheese, egg or low-fat high-protein yogurt with only a small amount of cereal  Continue to avoid sweet drinks and have only small portions of sweets or fried food  Start taking GLIPIZIDE 5 mg, half tablet just before eating breakfast and supper       Keyshla Tunison 06/14/2017, 3:02 PM   Note:  This office note was prepared with Insurance underwriter. Any transcriptional errors that result from this process are unintentional.

## 2017-06-15 ENCOUNTER — Encounter: Payer: Self-pay | Admitting: Podiatry

## 2017-06-15 ENCOUNTER — Ambulatory Visit (INDEPENDENT_AMBULATORY_CARE_PROVIDER_SITE_OTHER): Payer: Medicare Other | Admitting: Podiatry

## 2017-06-15 DIAGNOSIS — E119 Type 2 diabetes mellitus without complications: Secondary | ICD-10-CM | POA: Diagnosis not present

## 2017-06-15 DIAGNOSIS — B351 Tinea unguium: Secondary | ICD-10-CM

## 2017-06-15 NOTE — Progress Notes (Signed)
Patient ID: [name] [name], female   DOB: 03/17/1946, 71 y.o.   MRN: [id number]   This patient presents today stating that her toenails are elongated and thickened with color changes and have gradually become more difficult for patient to trim in the past several months. Patient denies any recent podiatric care for this problem. Patient is a diabetic and denies any history of ulceration, claudication or amputation  Objective:Orientated 3  Vascular: DP pulses 2/4 bilaterally PT pulses 2/4 bilaterally Capillary reflex immediate bilaterally  Neurological: Sensation to 10 g monofilament wire intact 5/5 bilaterally  Vibratory sensation intact bilaterally Ankle reflex equal and reactive bilaterally  Dermatological: No open skin lesions bilaterally The toenails are elongated incurvated.with texture and color changes 6-10 Absent hair growth bilaterally  Musculoskeletal: Pes planus bilaterally HAV bilaterally Hammertoe second left There is no restriction ankle, subtalar, midtarsal joints bilaterally Prominence dorsal midfoot bony areas palpated bilaterally  Assessment: Satisfactory neurovascular status Diabetic without foot complications Mycotic toenails 6-10  Plan: Debridement of toenails 6-10 mechanically and legs with slight bleeding distal fourth right toe, treated with topical antibiotic ointment and Band-Aid. Patient instructed removed Band-Aid 1-3 days and continue to apply topical antibiotic ointment and Band-Aid daily until a scab forms.   reappoint at 3 months

## 2017-06-15 NOTE — Patient Instructions (Signed)
Removed Band-Aid on fourth right toe 1-3 days and continue apply topical antibiotic ointment and Band-Aid daily until a scab forms  Diabetes and Foot Care Diabetes may cause you to have problems because of poor blood supply (circulation) to your feet and legs. This may cause the skin on your feet to become thinner, break easier, and heal more slowly. Your skin may become dry, and the skin may peel and crack. You may also have nerve damage in your legs and feet causing decreased feeling in them. You may not notice minor injuries to your feet that could lead to infections or more serious problems. Taking care of your feet is one of the most important things you can do for yourself. Follow these instructions at home:  Wear shoes at all times, even in the house. Do not go barefoot. Bare feet are easily injured.  Check your feet daily for blisters, cuts, and redness. If you cannot see the bottom of your feet, use a mirror or ask someone for help.  Wash your feet with warm water (do not use hot water) and mild soap. Then pat your feet and the areas between your toes until they are completely dry. Do not soak your feet as this can dry your skin.  Apply a moisturizing lotion or petroleum jelly (that does not contain alcohol and is unscented) to the skin on your feet and to dry, brittle toenails. Do not apply lotion between your toes.  Trim your toenails straight across. Do not dig under them or around the cuticle. File the edges of your nails with an emery board or nail file.  Do not cut corns or calluses or try to remove them with medicine.  Wear clean socks or stockings every day. Make sure they are not too tight. Do not wear knee-high stockings since they may decrease blood flow to your legs.  Wear shoes that fit properly and have enough cushioning. To break in new shoes, wear them for just a few hours a day. This prevents you from injuring your feet. Always look in your shoes before you put them on  to be sure there are no objects inside.  Do not cross your legs. This may decrease the blood flow to your feet.  If you find a minor scrape, cut, or break in the skin on your feet, keep it and the skin around it clean and dry. These areas may be cleansed with mild soap and water. Do not cleanse the area with peroxide, alcohol, or iodine.  When you remove an adhesive bandage, be sure not to damage the skin around it.  If you have a wound, look at it several times a day to make sure it is healing.  Do not use heating pads or hot water bottles. They may burn your skin. If you have lost feeling in your feet or legs, you may not know it is happening until it is too late.  Make sure your health care provider performs a complete foot exam at least annually or more often if you have foot problems. Report any cuts, sores, or bruises to your health care provider immediately. Contact a health care provider if:  You have an injury that is not healing.  You have cuts or breaks in the skin.  You have an ingrown nail.  You notice redness on your legs or feet.  You feel burning or tingling in your legs or feet.  You have pain or cramps in your legs and feet.  Your legs or feet are numb.  Your feet always feel cold. Get help right away if:  There is increasing redness, swelling, or pain in or around a wound.  There is a red line that goes up your leg.  Pus is coming from a wound.  You develop a fever or as directed by your health care provider.  You notice a bad smell coming from an ulcer or wound. This information is not intended to replace advice given to you by your health care provider. Make sure you discuss any questions you have with your health care provider. Document Released: 10/29/2000 Document Revised: 04/08/2016 Document Reviewed: 04/10/2013 Elsevier Interactive Patient Education  2017 [employer].

## 2017-07-16 ENCOUNTER — Other Ambulatory Visit: Payer: Self-pay | Admitting: Internal Medicine

## 2017-07-17 ENCOUNTER — Other Ambulatory Visit: Payer: Self-pay | Admitting: Endocrinology

## 2017-07-22 ENCOUNTER — Encounter: Payer: Medicare Other | Attending: Endocrinology | Admitting: Dietician

## 2017-07-22 ENCOUNTER — Encounter: Payer: Self-pay | Admitting: Dietician

## 2017-07-22 ENCOUNTER — Other Ambulatory Visit (INDEPENDENT_AMBULATORY_CARE_PROVIDER_SITE_OTHER): Payer: Medicare Other

## 2017-07-22 DIAGNOSIS — E7800 Pure hypercholesterolemia, unspecified: Secondary | ICD-10-CM | POA: Diagnosis not present

## 2017-07-22 DIAGNOSIS — Z713 Dietary counseling and surveillance: Secondary | ICD-10-CM | POA: Insufficient documentation

## 2017-07-22 DIAGNOSIS — E1165 Type 2 diabetes mellitus with hyperglycemia: Secondary | ICD-10-CM

## 2017-07-22 DIAGNOSIS — E118 Type 2 diabetes mellitus with unspecified complications: Secondary | ICD-10-CM

## 2017-07-22 DIAGNOSIS — Z6822 Body mass index (BMI) 22.0-22.9, adult: Secondary | ICD-10-CM | POA: Diagnosis not present

## 2017-07-22 LAB — LIPID PANEL
CHOLESTEROL: 172 mg/dL (ref 0–200)
HDL: 79.2 mg/dL (ref >39.00)
LDL CALC: 80 mg/dL (ref 0–99)
NonHDL: 92.66
TRIGLYCERIDES: 63 mg/dL (ref 0.0–149.0)
Total CHOL/HDL Ratio: 2
VLDL: 12.6 mg/dL (ref 0.0–40.0)

## 2017-07-22 LAB — HEMOGLOBIN A1C: HEMOGLOBIN A1C: 7.1 % — AB (ref 4.6–6.5)

## 2017-07-22 LAB — BASIC METABOLIC PANEL
BUN: 8 mg/dL (ref 6–23)
CHLORIDE: 104 meq/L (ref 96–112)
CO2: 27 mEq/L (ref 19–32)
CREATININE: 0.75 mg/dL (ref 0.40–1.20)
Calcium: 10.3 mg/dL (ref 8.4–10.5)
GFR: 98.22 mL/min (ref >60.00)
Glucose, Bld: 122 mg/dL — ABNORMAL HIGH (ref 70–99)
Potassium: 3.9 mEq/L (ref 3.5–5.1)
Sodium: 142 mEq/L (ref 135–145)

## 2017-07-22 NOTE — Progress Notes (Signed)
Diabetes Self-Management Education  Visit Type: First/Initial  Appt. Start Time: 1330 Appt. End Time: 1430  07/22/2017  [name], identified by name and date of birth, is a 71 y.o. female with a diagnosis of Diabetes: Type 2. Other hx includes HTN and Hyperlipidemia. Medications include anumet XR and Glipizide.  Patient states that she has an inconsistent meal schedule.  If she skips a meal, she does not take her diabetes medications.  She was taking the glipizide in the am instead of 2.5 mg with breakfast and dinner.  (Reiterated Dr. Remus Blake instructions regarding this with patient.)  Also, she had started taking the 50,000 units of vitamin D twice weekly again because she thought that she thought she forgot to get this filled for a couple of months.  She still takes the daily dose of vitamin D as well.  Instructed her to discuss this with Dr. Lucianne Muss at her appointment next week.  She checks her blood sugar 1 time per day and continues to share the meter with her husband.    Patient lives with her husband who also has diabetes.  They share shopping and cooking.  Patient states that she is a picky eater.  She drinks little water and will only drink "[employer]".   Her diet is very high in sugar (regular beverages, 1/3 cup in oatmeal, 1/3 cup in coffee).  ASSESSMENT  Height  (1.651 m), weight 134 lb (60.8 kg). Body mass index is 22.3 kg/m.      Diabetes Self-Management Education - 07/22/17 1356      Visit Information   Visit Type First/Initial     Initial Visit   Diabetes Type Type 2   Are you currently following a meal plan? No   Are you taking your medications as prescribed? Yes   Date Diagnosed 2008     Health Coping   How would you rate your overall health? Good     Psychosocial Assessment   Patient Belief/Attitude about Diabetes Motivated to manage diabetes   Self-care barriers Other (comment)  Desire to change   Self-management support Doctor's office;Family    Other persons present Patient   Patient Concerns Nutrition/Meal planning;Glycemic Control   Special Needs None   Preferred Learning Style No preference indicated   Learning Readiness Ready   How often do you need to have someone help you when you read instructions, pamphlets, or other written materials from your doctor or pharmacy? 1 - Never   What is the last grade level you completed in school? 12th grade     Pre-Education Assessment   Patient understands the diabetes disease and treatment process. Needs Review   Patient understands incorporating nutritional management into lifestyle. Needs Review   Patient undertands incorporating physical activity into lifestyle. Needs Review   Patient understands using medications safely. Needs Review   Patient understands monitoring blood glucose, interpreting and using results Needs Review   Patient understands prevention, detection, and treatment of acute complications. Needs Review   Patient understands prevention, detection, and treatment of chronic complications. Needs Review   Patient understands how to develop strategies to address psychosocial issues. Needs Review   Patient understands how to develop strategies to promote health/change behavior. Needs Review     Complications   Last HgB A1C per patient/outside source 9.9 %  04/20/17   How often do you check your blood sugar? 1-2 times/day   Fasting Blood glucose range (mg/dL) 161-096   Number of hypoglycemic episodes per month 0  Have you had a dilated eye exam in the past 12 months? Yes   Have you had a dental exam in the past 12 months? No   Are you checking your feet? Yes   How many days per week are you checking your feet? 7     Dietary Intake   Breakfast Oatmeal with 1/3 cup sugar, cinnamon, milk, applesauce OR bacon OR toast OR cereal (plain cheerios with sugar or frosted flakes and milk)   Snack (morning) occasional cookies   Lunch SKIPS   Snack (afternoon) none   Dinner fish  or chicken, potatoes or rice or corn, vegetables OR occasional salad with [place]   Snack (evening) Activia yogurt   Beverage(s) regular coolaide, sweet tea, regular gingerale, coffee with 1/3 cup sugar and cream, very little water     Exercise   Exercise Type Light (walking / raking leaves)  has a membership at [employer] but doesn't use it but does clean her house   How many days per week to you exercise? 1   How many minutes per day do you exercise? 60   Total minutes per week of exercise 60     Patient Education   Previous Diabetes Education No   Nutrition management  Role of diet in the treatment of diabetes and the relationship between the three main macronutrients and blood glucose level;Food label reading, portion sizes and measuring food.;Meal options for control of blood glucose level and chronic complications.;Meal timing in regards to the patients' current diabetes medication.   Physical activity and exercise  Role of exercise on diabetes management, blood pressure control and cardiac health.   Medications Reviewed patients medication for diabetes, action, purpose, timing of dose and side effects.   Monitoring Daily foot exams;Yearly dilated eye exam   Chronic complications Retinopathy and reason for yearly dilated eye exams   Psychosocial adjustment Worked with patient to identify barriers to care and solutions     Individualized Goals (developed by patient)   Nutrition General guidelines for healthy choices and portions discussed   Physical Activity Exercise 3-5 times per week;30 minutes per day   Medications take my medication as prescribed   Monitoring  test my blood glucose as discussed   Problem Solving consistent meals   Reducing Risk examine blood glucose patterns;Other (comment)  change beverages to water or unsweetened   Health Coping discuss diabetes with (comment)  MD/RD     Post-Education Assessment   Patient understands the diabetes disease and treatment  process. Needs Review   Patient understands incorporating nutritional management into lifestyle. Demonstrates understanding / competency   Patient undertands incorporating physical activity into lifestyle. Demonstrates understanding / competency   Patient understands using medications safely. Needs Review   Patient understands monitoring blood glucose, interpreting and using results Needs Review   Patient understands prevention, detection, and treatment of acute complications. Needs Review   Patient understands prevention, detection, and treatment of chronic complications. Needs Review   Patient understands how to develop strategies to address psychosocial issues. Demonstrates understanding / competency   Patient understands how to develop strategies to promote health/change behavior. Demonstrates understanding / competency     Outcomes   Expected Outcomes Demonstrated interest in learning. Expect positive outcomes   Future DMSE 4-6 wks   Program Status Completed      Individualized Plan for Diabetes Self-Management Training:   Learning Objective:  Patient will have a greater understanding of diabetes self-management. Patient education plan is to attend individual and/or group  sessions per assessed needs and concerns.   Plan:   Patient Instructions  Discuss the vitamin D with Dr. Lucianne MussKumar.  Should you still be taking the 50,000 units 2 times per week or 2000 units daily? Brink your meter to Dr. Remus BlakeKumar's appointment.  Make an eye appointment.  Rethink what you drink.  Avoid beverages with sugar.  Drink more water. Avoid adding added sugar to your foods. Avoid skipping meals. Eat meal should have a small portion of protein (lean meat, low fat cheese, peanut butter, yogurt, dried beans) Eat more vegetables.   Expected Outcomes:  Demonstrated interest in learning. Expect positive outcomes  Education material provided: Food label handouts, A1C conversion sheet, Meal plan card and My  Plate  If problems or questions, patient to contact team via:  Phone  Future DSME appointment: 4-6 wks

## 2017-07-22 NOTE — Patient Instructions (Signed)
Discuss the vitamin D with Dr. Lucianne MussKumar.  Should you still be taking the 50,000 units 2 times per week or 2000 units daily? Brink your meter to Dr. Remus BlakeKumar's appointment.  Make an eye appointment.  Rethink what you drink.  Avoid beverages with sugar.  Drink more water. Avoid adding added sugar to your foods. Avoid skipping meals. Eat meal should have a small portion of protein (lean meat, low fat cheese, peanut butter, yogurt, dried beans) Eat more vegetables.

## 2017-07-23 LAB — FRUCTOSAMINE: FRUCTOSAMINE: 299 umol/L — AB (ref 0–285)

## 2017-07-24 ENCOUNTER — Other Ambulatory Visit: Payer: Self-pay | Admitting: Internal Medicine

## 2017-07-26 ENCOUNTER — Ambulatory Visit (INDEPENDENT_AMBULATORY_CARE_PROVIDER_SITE_OTHER): Payer: Medicare Other | Admitting: Endocrinology

## 2017-07-26 ENCOUNTER — Other Ambulatory Visit: Payer: Self-pay

## 2017-07-26 ENCOUNTER — Encounter: Payer: Self-pay | Admitting: Endocrinology

## 2017-07-26 VITALS — BP 116/64 | HR 82 | Ht 62.0 in | Wt 134.0 lb

## 2017-07-26 DIAGNOSIS — E1165 Type 2 diabetes mellitus with hyperglycemia: Secondary | ICD-10-CM | POA: Diagnosis not present

## 2017-07-26 MED ORDER — GLUCOSE BLOOD VI STRP: ORAL_STRIP | 4 refills | Status: DC

## 2017-07-26 NOTE — Patient Instructions (Addendum)
Glipizide 1/2 in am and 1 before supper  Take Pravastatin at supper  Check blood sugars on waking up  2-3/7 days  Also check blood sugars about 2 hours after a meal and do this after different meals by rotation  Recommended blood sugar levels on waking up is 90-130 and about 2 hours after meal is 130-160  Please bring your blood sugar monitor to each visit, thank you  Reduce sugar intake  Vitamin D weekly

## 2017-07-26 NOTE — Progress Notes (Signed)
Patient ID: [name] [name], female   DOB: October 21, 1946, 71 y.o.   MRN: [id number]            Reason for Appointment: F/u for Type 2 Diabetes  Primary care Physician: Allyne Gee   History of Present Illness:          Date of diagnosis of type 2 diabetes mellitus: 2008 ?        Background history:   Not clear how she was diagnosed initially but she was having symptoms of frequent urination She has taken mostly metformin for her treatment since the diagnosis Overall has tried several medications in the past including Januvia, Onglyza, Actos and acarbose Her control had been generally fairly good with A1c around 7% since about 2013 Unclear from her history and office notes why her medications have been changed over the last 3 years She may have had some swelling of her legs with Actos  Recent history:        Non-insulin hypoglycemic drugs the patient is taking are: Janumet XR 50/500, twice a day, Glipizide  in am   Her A1c is much better at 7.1, previously 9.9  Current management, blood sugar patterns and problems identified:  She was started on GLIPIZIDE on her last visit in addition to Janumet because of inadequate control  However instead of taking half tablet twice a day to help control her postprandial readings she is taking only 1 tablet before breakfast  She was also told to start using a meter for her own self instead of sharing with her husband and she is still not doing this  Her meter has incorrect date and time and difficult to know when she is checking her sugars and which readings are hers  FASTING glucose at home daily was 188  She does not usually check readings after evening meal  She said that she is not able to figure out how to use any other meter that has been provided including the One Touch Verio and the contour that she has  She does not think however that her blood sugars are usually below 140 and probably mostly in the upper 100 range  She has just  started working with the dietitian who has told her to cut back on the significant amount of added sugar data she is using in foods and drinks.  Not clear if she hasn't made any changes yet and asking about using Splenda  No recent weight change      Side effects from medications have been:?  Edema from Actos.?  Rash from Amaryl.  Abdominal discomfort from metformin   Compliance with the medical regimen: Inadequate  Glucose monitoring:  done 1  times a day         Glucometer: One Touch ultra mini .      Blood Glucose readings as above  Self-care:    Meal times are quite variable Less sweet drinks recently  Typical meal intake: Breakfast is cereal/oatmeal  usually, eating at 10-11 AM.  Evening meal is usually 6-7 PM She will mostly meat and vegetables in the evenings  Snacks will be some cookies, potato chips and yogurt               Dietician visit, most recent:?  2015               Exercise: Limited by  knee pain, just walking around the house  Weight history:  Wt Readings from Last 3 Encounters:  07/26/17 134  lb (60.8 kg)  07/22/17 134 lb (60.8 kg)  06/14/17 134 lb 6.4 oz (61 kg)    Glycemic control:   Lab Results  Component Value Date   HGBA1C 7.1 (H) 07/22/2017   HGBA1C 9.9 04/20/2017   HGBA1C 9.2 (H) 01/14/2017   Lab Results  Component Value Date   MICROALBUR 1.0 01/14/2017   LDLCALC 80 07/22/2017   CREATININE 0.75 07/22/2017   Lab Results  Component Value Date   MICRALBCREAT 5 01/14/2017    Lab Results  Component Value Date   FRUCTOSAMINE 299 (H) 07/22/2017      Allergies as of 07/26/2017      Reactions   Colcrys [colchicine] Nausea Only   Hydrocodone    Sulindac    Tramadol    "Made me feel like I was in another world."   Celecoxib Rash   Glimepiride Rash      Medication List       Accurate as of 07/26/17 12:50 PM. Always use your most recent med list.          ALPRAZolam 0.5 MG tablet Commonly known as:  XANAX Take 0.5-1 tablets  (0.25-0.5 mg total) by mouth at bedtime as needed for sleep.   aspirin 81 MG tablet Take 81 mg by mouth daily.   CLARITIN PO Take 10 mg by mouth daily as needed.   colchicine 0.6 MG tablet 1 tablet by mouth daily when necessary   diltiazem 300 MG 24 hr capsule Commonly known as:  CARDIZEM CD Take 1 capsule (300 mg total) by mouth daily.   DULoxetine 30 MG capsule Commonly known as:  CYMBALTA Take 30 mg by mouth daily.   fluticasone 50 MCG/ACT nasal spray Commonly known as:  FLONASE Place 2 sprays into both nostrils daily.   glipiZIDE 5 MG tablet Commonly known as:  GLUCOTROL Half tablet before breakfast and half tablet before supper   glucose blood test strip Commonly known as:  ONETOUCH VERIO Use to test blood sugar once daily   JANUMET XR 50-500 MG Tb24 Generic drug:  SitaGLIPtin-MetFORMIN HCl TAKE 2 TABLETS DAILY   metoCLOPramide 10 MG tablet Commonly known as:  REGLAN Take 10 mg by mouth 4 (four) times daily.   OMEGA 3 PO Take 1 tablet by mouth daily. Reported on 11/18/2015   PENNSAID 1.5 % Soln Generic drug:  Diclofenac Sodium Place onto the skin.   pravastatin 20 MG tablet Commonly known as:  PRAVACHOL Take 1 tablet (20 mg total) by mouth at bedtime.   TURMERIC PO Take 1 tablet by mouth daily. OTC   Vitamin D (Ergocalciferol) 50000 units Caps capsule Commonly known as:  DRISDOL Take 50,000 Units by mouth 2 (two) times a week. Takes on Tuesdays and Fridays   Vitamin D 2000 units Caps Take 2,000 Units by mouth daily. Reported on 11/18/2015       Allergies:  Allergies  Allergen Reactions  . Colcrys [Colchicine] Nausea Only  . Hydrocodone   . Sulindac   . Tramadol     "Made me feel like I was in another world."  . Celecoxib Rash  . Glimepiride Rash    Past Medical History:  Diagnosis Date  . Bilateral knee pain    Euflexxa x3  . Chest pain 06/2007   CP and QW on EKG: neg stress test   . Diabetes mellitus    w/ neuropathy  .  Diverticulitis 2000  . Hyperlipidemia   . Hypertension   . Insomnia 02/20/2013  . Osteoarthritis  gout, CPPD--- sees Dr.Davenshwar  . Osteoarthritis of both feet 09/14/2016  . Osteoarthritis of both hands 09/14/2016  . Osteoarthritis of both knees 09/14/2016  . Osteopenia    per DEXA 4/10  . Recurrent boils    h/o MRSA    Past Surgical History:  Procedure Laterality Date  . CHOLECYSTECTOMY    . TONSILLECTOMY      Family History  Problem Relation Age of Onset  . Hypertension Mother   . Diabetes Mother   . Hypertension Father   . Diabetes Father   . Hypertension Daughter   . Hypertension Sister   . Diabetes Sister   . Coronary artery disease Neg Hx   . Stroke Neg Hx   . Colon cancer Neg Hx   . Breast cancer Neg Hx     Social History:  reports that she has never smoked. She has never used smokeless tobacco. She reports that she does not drink alcohol or use drugs.   Review of Systems   Lipid history: On Pravachol 20 mg with last lipids Improved    Lab Results  Component Value Date   CHOL 172 07/22/2017   HDL 79.20 07/22/2017   LDLCALC 80 07/22/2017   LDLDIRECT 111.1 02/20/2013   TRIG 63.0 07/22/2017   CHOLHDL 2 07/22/2017           Hypertension:Is taking diltiazem   Most recent eye exam was 12/17  Most recent foot exam:01/2017  She thinks she is taking vitamin D3 and just picked up her vitamin D prescription also to take 50,000 units twice a week  Physical Examination:  BP 116/64   Pulse 82   Ht  (1.575 m)   Wt 134 lb (60.8 kg)   SpO2 98%   BMI 24.51 kg/m    ASSESSMENT:  Diabetes type 2, uncontrolled     See history of present illness for detailed discussion of current diabetes management, blood sugar patterns and problems identified  A1c is 7.1 which is significantly better Even though she was probably a candidate for insulin initially she appears to be responding to low dose glipizide along with her Janumet Most likely she still has  high readings after meals and overnight partly because of her not watching her diet She has seen the dietitian and needs to start making changes  LIPIDS: Recently better  PLAN:     She will start checking her blood sugars with her own meter and she discussed how to use the One Touch Verio meter with nurse educator  She needs to start checking some readings after meals in the evening  She needs to cut back on added sugar, may use Splenda once a day  Glipizide dosage: She will do only half tablet in the morning and 1 tablet before supper  She may take her pravastatin at dinnertime instead of bedtime for convenience and then she does not have a snack necessarily at bedtime  No change in pravastatin dosage  Vitamin D supplements: She needs to take only 50,000 units weekly and not the D3 at the same time, to follow-up with PCP   Patient Instructions  Glipizide 1/2 in am and 1 before supper  Take Pravastatin at supper  Check blood sugars on waking up  2-3/7 days  Also check blood sugars about 2 hours after a meal and do this after different meals by rotation  Recommended blood sugar levels on waking up is 90-130 and about 2 hours after meal is 130-160  Please bring  your blood sugar monitor to each visit, thank you  Reduce sugar intake  Vitamin D weekly     [name] [name] 07/26/2017, 12:50 PM   Note: This office note was prepared with Insurance underwriterDragon voice recognition system technology. Any transcriptional errors that result from this process are unintentional.

## 2017-08-22 ENCOUNTER — Other Ambulatory Visit: Payer: Self-pay | Admitting: Endocrinology

## 2017-09-02 ENCOUNTER — Other Ambulatory Visit: Payer: Self-pay

## 2017-09-02 ENCOUNTER — Encounter: Payer: Medicare Other | Attending: Endocrinology | Admitting: Dietician

## 2017-09-02 DIAGNOSIS — Z713 Dietary counseling and surveillance: Secondary | ICD-10-CM | POA: Insufficient documentation

## 2017-09-02 DIAGNOSIS — E1165 Type 2 diabetes mellitus with hyperglycemia: Secondary | ICD-10-CM | POA: Diagnosis not present

## 2017-09-02 DIAGNOSIS — Z6822 Body mass index (BMI) 22.0-22.9, adult: Secondary | ICD-10-CM | POA: Insufficient documentation

## 2017-09-02 DIAGNOSIS — E118 Type 2 diabetes mellitus with unspecified complications: Secondary | ICD-10-CM

## 2017-09-02 MED ORDER — GLUCOSE BLOOD VI STRP: ORAL_STRIP | 4 refills | Status: DC

## 2017-09-02 NOTE — Patient Instructions (Addendum)
Resume taking your Glipizide.  1/2 tablet with breakfast and 1 tablet with dinner Vitamin D:  50,000 units once time per week and discuss this with your primary doctor.  Reduce your sugar intake.  Choose unsweetened beverages rather than sweetened.  Get MD advice prior to taking the Janumet samples (100mg /1000mg ).

## 2017-09-02 NOTE — Progress Notes (Signed)
Diabetes Self-Management Education  Visit Type:  Follow-up  Appt. Start Time: 1310 Appt. End Time: 1345  09/02/2017  [name], identified by name and date of birth, is a 71 y.o. female with a diagnosis of Diabetes: Type 2.  Other hx includes HTN and hyperlipidemia.  Patient is here today alone for follow up.  She brought her mediccations with her.  She is a patient of Dr. Lucianne Muss and was prescribed Janumet XR 50/500.  Her primary physician gave her samples of Janumet XR 100/1000.  Message given to Dr. Lucianne Muss to give instruction to patient regarding this medication.  She also has been prescribed Glipizide 1/2 tablet before breakfast and 1 tablet before supper.  She does not have this medication with her and does not think that she has been taking it recently.  She also states that she has continued to take the Vitamin D 50,000 units 2 times per week.  Clarified that this should be taken only weekly and to discuss with her primary care provider.  She has stopped drinking coolaide but has been making green tea with 1 1/2 cups of sugar per pitcher.  She dislikes water and is not drinking much.  She does not like fruit.  She is out of strips and prescription obtained from nurse.  Insulin has been suggested but patient does not believe that she can give herself a shot.  She does give her husband insulin.  Patient lives with her husband who also has diabetes.  They share shopping and cooking.  Patient states that she is a picky eater.  She drinks little water and will only drink "[employer]".  Her diet continues to be very high in sugar.  ASSESSMENT  Weight 134 lb (60.8 kg). Body mass index is 24.51 kg/m.       Diabetes Self-Management Education - 09/02/17 1744      Psychosocial Assessment   Patient Belief/Attitude about Diabetes Denial   Self-care barriers Other (comment)  desire to change, memory concerns   Self-management support Doctor's office   Patient Concerns Nutrition/Meal  planning;Glycemic Control   Special Needs None   Preferred Learning Style No preference indicated   Learning Readiness Contemplating     Pre-Education Assessment   Patient understands the diabetes disease and treatment process. Needs Review   Patient understands incorporating nutritional management into lifestyle. Needs Review   Patient undertands incorporating physical activity into lifestyle. Demonstrates understanding / competency   Patient understands using medications safely. Needs Review   Patient understands monitoring blood glucose, interpreting and using results Demonstrates understanding / competency   Patient understands prevention, detection, and treatment of acute complications. Demonstrates understanding / competency   Patient understands prevention, detection, and treatment of chronic complications. Demonstrates understanding / competency   Patient understands how to develop strategies to address psychosocial issues. Needs Review   Patient understands how to develop strategies to promote health/change behavior. Needs Review     Complications   Last HgB A1C per patient/outside source 7.1 %  07/22/2017   How often do you check your blood sugar? 1-2 times/day     Dietary Intake   Breakfast cereal with milk   Lunch SKIPS   Dinner fish or chicken, potatoes or rice or corn, vegetables OR occasional salad with [place]   Snack (evening) actevia yogurt   Beverage(s) sweetend green tea, regular gingeral, coffee with 1/3 cup sugar and cream, very little water     Exercise   Exercise Type Light (walking / raking leaves)  How many days per week to you exercise? 1   How many minutes per day do you exercise? 60   Total minutes per week of exercise 60     Patient Education   Previous Diabetes Education Yes (please comment)  6 weeks ago   Nutrition management  Role of diet in the treatment of diabetes and the relationship between the three main macronutrients and blood glucose  level;Food label reading, portion sizes and measuring food.;Meal options for control of blood glucose level and chronic complications.;Meal timing in regards to the patients' current diabetes medication.   Medications Reviewed patients medication for diabetes, action, purpose, timing of dose and side effects.   Acute complications Taught treatment of hypoglycemia - the 15 rule.   Psychosocial adjustment Worked with patient to identify barriers to care and solutions     Individualized Goals (developed by patient)   Nutrition General guidelines for healthy choices and portions discussed   Physical Activity Exercise 3-5 times per week;30 minutes per day   Medications take my medication as prescribed   Monitoring  test my blood glucose as discussed   Problem Solving consistent meals, reduce sugar   Health Coping discuss diabetes with (comment)  MD,RD,CDE     Patient Self-Evaluation of Goals - Patient rates self as meeting previously set goals (% of time)   Nutrition < 25%   Physical Activity 25 - 50%   Medications 50 - 75 %   Monitoring >75%   Problem Solving 25 - 50%   Reducing Risk 25 - 50%   Health Coping 25 - 50%     Post-Education Assessment   Patient understands the diabetes disease and treatment process. Needs Review   Patient understands incorporating nutritional management into lifestyle. Needs Review   Patient undertands incorporating physical activity into lifestyle. Demonstrates understanding / competency   Patient understands using medications safely. Needs Review   Patient understands monitoring blood glucose, interpreting and using results Demonstrates understanding / competency   Patient understands prevention, detection, and treatment of acute complications. Demonstrates understanding / competency   Patient understands prevention, detection, and treatment of chronic complications. Demonstrates understanding / competency   Patient understands how to develop strategies to  address psychosocial issues. Needs Review   Patient understands how to develop strategies to promote health/change behavior. Needs Review     Outcomes   Program Status Completed     Subsequent Visit   Since your last visit have you continued or begun to take your medications as prescribed? No   Since your last visit have you experienced any weight changes? No change   Since your last visit, are you checking your blood glucose at least once a day? Yes      Learning Objective:  Patient will have a greater understanding of diabetes self-management. Patient education plan is to attend individual and/or group sessions per assessed needs and concerns.   Plan:   Patient Instructions  Resume taking your Glipizide.  1/2 tablet with breakfast and 1 tablet with dinner Vitamin D:  50,000 units once time per week and discuss this with your primary doctor.  Reduce your sugar intake.  Choose unsweetened beverages rather than sweetened.  Get MD advice prior to taking the Janumet samples (100mg /1000mg ).    Expected Outcomes:  Other (comment) (seemed overwhelmed by changes requestion that she does not want to do)  Education material provided:  None this visit  If problems or questions, patient to contact team via:  Phone  Future DSME  appointment: - 2 wks with Bonita Quin, CDE and myself in 6 weeks.

## 2017-09-05 ENCOUNTER — Telehealth: Payer: Self-pay | Admitting: Dietician

## 2017-09-05 NOTE — Telephone Encounter (Signed)
Called patient regarding her Janumet 100/1000 samples.  Clarified with Dr. Lucianne MussKumar.  When she runs out of the Janumet 50/500 (2 per day) she can take the Janumet 100/1000 once per day. Reviewed need to change her beverages to sugar free. Oran ReinLaura Temekia Caskey, RD, LDN

## 2017-09-10 NOTE — Progress Notes (Signed)
Office Visit Note  Patient: [name] [name]             Date of Birth: Dec 17, 1945           MRN: [id number]             PCP: Dorothyann Peng, MD Referring: Wanda Plump, MD Visit Date: 09/21/2017 Occupation: @GUAROCC @    Subjective:  Osteoarthritis (not doing well, BIL knee pain, BIL hand swelling )   History of Present Illness: [name] [name] is a 71 y.o. female with history of osteoarthritis, pseudogout and osteopenia. She states she's been having increased pain in her bilateral knee joints. She also has been having some discomfort in her bilateral hands.she reports intermittent swelling in her hands and knee joints.  Activities of Daily Living:  Patient reports morning stiffness for  minute.   Patient Denies nocturnal pain.  Difficulty dressing/grooming: Denies Difficulty climbing stairs: reports Difficulty getting out of chair: Denies Difficulty using hands for taps, buttons, cutlery, and/or writing: Denies   Review of Systems  Constitutional: Negative for fatigue, night sweats, weight gain, weight loss and weakness.  HENT: Negative for mouth sores, trouble swallowing, trouble swallowing, mouth dryness and nose dryness.   Eyes: Negative for pain, redness, visual disturbance and dryness.  Respiratory: Negative for cough, shortness of breath and difficulty breathing.   Cardiovascular: Positive for hypertension. Negative for chest pain, palpitations, irregular heartbeat and swelling in legs/feet.  Gastrointestinal: Negative for blood in stool, constipation and diarrhea.  Endocrine: Negative for increased urination.  Genitourinary: Negative for vaginal dryness.  Musculoskeletal: Positive for arthralgias, joint pain and morning stiffness. Negative for joint swelling, myalgias, muscle weakness, muscle tenderness and myalgias.  Skin: Negative for color change, rash, hair loss, skin tightness, ulcers and sensitivity to sunlight.  Allergic/Immunologic: Negative for susceptible to  infections.  Neurological: Negative for dizziness, memory loss and night sweats.  Hematological: Negative for swollen glands.  Psychiatric/Behavioral: Positive for sleep disturbance. Negative for depressed mood. The patient is not nervous/anxious.     PMFS History:  Patient Active Problem List   Diagnosis Date Noted  . 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 History of hypertension 12/08/2016  . History of diabetes mellitus 12/08/2016  . Dyslipidemia 12/08/2016  . Osteoarthritis of both hands 09/14/2016  . Osteoarthritis of both feet 09/14/2016  . Osteoarthritis of both knees 09/14/2016  . PCP NOTES >>> 08/19/2015  . Low back pain 09/09/2014  . Neuralgia of left saphenous nerve 06/05/2014  . Numbness and tingling in left arm 06/05/2014  . Paresthesia of left arm 05/21/2014  . Gastroparesis 02/20/2013  . Insomnia 02/20/2013  . Annual physical exam 03/31/2012  . History of diverticulitis 04/14/2010  . Osteopenia 05/14/2009  . DYSPHAGIA UNSPECIFIED 01/10/2009  . 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 Hyperlipidemia 06/14/2007  . Essential hypertension 06/14/2007  . Osteoarthritis 06/14/2007  . DM (diabetes mellitus) (HCC) 01/02/2007    Past Medical History:  Diagnosis Date  . Bilateral knee pain    Euflexxa x3  . Chest pain 06/2007   CP and QW on EKG: neg stress test   . Diabetes mellitus    w/ neuropathy  . Diverticulitis 2000  . Hyperlipidemia   . Hypertension   . Insomnia 02/20/2013  . Osteoarthritis    gout, CPPD--- sees Dr.Davenshwar  . Osteoarthritis of both feet 09/14/2016  . Osteoarthritis of both hands 09/14/2016  . Osteoarthritis of both knees 09/14/2016  . Osteopenia    per DEXA 4/10  . Recurrent boils  h/o MRSA    Family History  Problem Relation Age of Onset  . Hypertension Mother   . Diabetes Mother   . Hypertension Father   . Diabetes Father   . Hypertension Daughter   . Hypertension Sister   . Diabetes Sister   . Coronary artery disease Neg Hx   . Stroke  Neg Hx   . Colon cancer Neg Hx   . Breast cancer Neg Hx    Past Surgical History:  Procedure Laterality Date  . CHOLECYSTECTOMY    . TONSILLECTOMY     Social History   Social History Narrative   Lives w/ husband   Patient is married with 3 children.   Patient is right handed.   Patient has college education.   Patient drinks 3-4 daily.     Objective: Vital Signs: BP 127/67 (BP Location: Left Arm, Patient Position: Sitting, Cuff Size: Normal)   Pulse 86   Resp 15   Ht 5\' 3"  (1.6 m)   Wt 132 lb (59.9 kg)   BMI 23.38 kg/m    Physical Exam   Musculoskeletal Exam: C-spine and thoracic lumbar spine good range of motion. Shoulder joints elbow joints wrist joints are good range of motion. She has DIP PIP thickening but no synovitis. She had mild tenderness over right trochanteric bursa. She has discomfort with range of motion of bilateral knee joints without any warmth swelling or effusion.  CDAI Exam: No CDAI exam completed.    Investigation: No additional findings.Uric acid: 12/09/16 6.3 CBC Latest Ref Rng & Units 02/08/2017 12/09/2016 04/02/2016  WBC 4.0 - 10.5 K/uL 8.3 7.2 5.3  Hemoglobin 12.0 - 15.0 g/dL 96.013.2 45.413.4 09.812.3  Hematocrit 36.0 - 46.0 % 39.4 40.2 37  Platelets 150.0 - 400.0 K/uL 290.0 304 309   CMP Latest Ref Rng & Units 07/22/2017 02/08/2017 12/09/2016  Glucose 70 - 99 mg/dL 119(J122(H) 478(G206(H) 956(O195(H)  BUN 6 - 23 mg/dL 8 6 10   Creatinine 0.40 - 1.20 mg/dL 1.300.75 8.650.62 7.840.63  Sodium 135 - 145 mEq/L 142 137 138  Potassium 3.5 - 5.1 mEq/L 3.9 4.0 4.2  Chloride 96 - 112 mEq/L 104 102 102  CO2 19 - 32 mEq/L 27 27 22   Calcium 8.4 - 10.5 mg/dL 69.610.3 9.9 9.7  Total Protein 6.0 - 8.3 g/dL - 7.3 6.7  Total Bilirubin 0.2 - 1.2 mg/dL - 0.4 0.5  Alkaline Phos 39 - 117 U/L - 74 73  AST 0 - 37 U/L - 13 11  ALT 0 - 35 U/L - 23 12    Imaging: No results found.  Speciality Comments: No specialty comments available.    Procedures:  No procedures performed Allergies: Colcrys  [colchicine]; Hydrocodone; Sulindac; Tramadol; Celecoxib; and Glimepiride   Assessment / Plan:     Visit Diagnoses: Primary osteoarthritis of both knees: She has no synovitis effusion or warmth on palpation. Joint protection and muscle strengthening was discussed. A handout on knee joints muscle strengthening exercises was given. She also had a prescription of [place]. I've advised her to renew that.  Primary osteoarthritis of both feet: She's been having some stiffness and discomfort in her feet. Proper fitting shoes were discussed.  Primary osteoarthritis of both hands: She complains of intermittent discomfort and pain in her hands. I've advised her to use Colcrys and may use patient said.  H/O calcium pyrophosphate deposition disease (CPPD) - Colcrys prn  Trochanteric bursitis of right EXB:MWUXLKGMWNhip:Stretching exercises were demonstrated and discussed.  Osteopenia of multiple sites: Calcium and  vitamin D was advised.  Her other medical problems are listed as follows:  History of diabetes mellitus  History of hypertension  History of diverticulitis  History of hyperlipidemia  History of insomnia    Orders: No orders of the defined types were placed in this encounter.  Meds ordered this encounter  Medications  . DISCONTD: Diclofenac Sodium (PENNSAID) 1.5 % SOLN    Sig: Place 0.5 mLs 2 (two) times daily onto the skin.    Dispense:  30 mL    Refill:  1  . Diclofenac Sodium (PENNSAID) 1.5 % SOLN    Sig: Place 0.5 mLs 2 (two) times daily onto the skin.    Dispense:  30 mL    Refill:  1      Follow-Up Instructions: Return in about 6 months (around 03/21/2018) for Osteoarthritis, pseudogout, osteopenia.   Pollyann Savoy, MD  Note - This record has been created using Animal nutritionist.  Chart creation errors have been sought, but may not always  have been located. Such creation errors do not reflect on  the standard of medical care.

## 2017-09-13 ENCOUNTER — Encounter: Payer: Medicare Other | Admitting: Nutrition

## 2017-09-13 DIAGNOSIS — E1165 Type 2 diabetes mellitus with hyperglycemia: Secondary | ICD-10-CM

## 2017-09-13 NOTE — Progress Notes (Signed)
Pt. Is testing once a day Fasting.  Blood sugars:116- 170.  Says 170 blood sugar was overeating the night before.   Meals are cold cereal and milk (sometimes cereal is Frosted Flakes.  Coffee has 1-2 T of sugar in it.  Stopped Splenda because someone told her it was not good for her.  Does not like [place].  Lunch and supper are balanced with no sweet drinks, but a cookie at HS.  Asked her to test her blood sugar acL on the days she eats cereal, and she agreed to do this.  Also suggested she test ac all meals or HS and call me there readings next week.  She was given a log sheet and my phone number and she agreed to do this.

## 2017-09-14 NOTE — Patient Instructions (Signed)
Test blood sugar once before lunch one day, and then before supper one day, and then before bedtime one day and call me next week with blood sugar readings.  Start back walking for 30 min. 5 days/wk. Stop sugar in coffee and switch to Splenda

## 2017-09-19 ENCOUNTER — Other Ambulatory Visit: Payer: Self-pay | Admitting: Internal Medicine

## 2017-09-20 ENCOUNTER — Encounter: Payer: Self-pay | Admitting: Podiatry

## 2017-09-20 ENCOUNTER — Ambulatory Visit (INDEPENDENT_AMBULATORY_CARE_PROVIDER_SITE_OTHER): Payer: Medicare Other | Admitting: Podiatry

## 2017-09-20 DIAGNOSIS — E119 Type 2 diabetes mellitus without complications: Secondary | ICD-10-CM | POA: Diagnosis not present

## 2017-09-20 DIAGNOSIS — B351 Tinea unguium: Secondary | ICD-10-CM

## 2017-09-20 NOTE — Progress Notes (Signed)
Patient ID: [name] SearingBetty M [name], female   DOB: 03/04/1946, 71 y.o.   MRN: [id number]    This patient presents today stating that her toenails are elongated and thickened with color changes and have gradually become more difficult for patient to trim in the past several months. Patient denies any recent podiatric care for this problem. Patient is a diabetic and denies any history of ulceration, claudication or amputation  Objective:Orientated 3  Vascular: DP pulses 2/4 bilaterally PT pulses 2/4 bilaterally Capillary reflex immediate bilaterally  Neurological: Sensation to 10 g monofilament wire intact 5/5 bilaterally  Vibratory sensation intact bilaterally Ankle reflex equal and reactive bilaterally  Dermatological: No open skin lesions bilaterally The toenails are elongated incurvated.withtexture and color changes 6-10 Absent hair growth bilaterally  Musculoskeletal: Pes planus bilaterally HAV bilaterally Hammertoe second left There is no restriction ankle, subtalar, midtarsal joints bilaterally Prominence dorsal midfoot bony areas palpated bilaterally  Assessment: Satisfactory neurovascular status Diabetic without foot complications Mycotic toenails 6-10  Plan: Debridement of toenails 6-10 mechanically and legs with slight bleeding distal fourth right toe, treated with topical antibiotic ointment and Band-Aid. Patient instructed removed Band-Aid 1-3 days and continue to apply topical antibiotic ointment and Band-Aid daily until a scab forms.   Reappoint at 3 months

## 2017-09-20 NOTE — Patient Instructions (Signed)

## 2017-09-21 ENCOUNTER — Ambulatory Visit (INDEPENDENT_AMBULATORY_CARE_PROVIDER_SITE_OTHER): Payer: Medicare Other | Admitting: Rheumatology

## 2017-09-21 ENCOUNTER — Encounter: Payer: Self-pay | Admitting: Rheumatology

## 2017-09-21 VITALS — BP 127/67 | HR 86 | Resp 15 | Ht 63.0 in | Wt 132.0 lb

## 2017-09-21 DIAGNOSIS — M19041 Primary osteoarthritis, right hand: Secondary | ICD-10-CM | POA: Diagnosis not present

## 2017-09-21 DIAGNOSIS — M19042 Primary osteoarthritis, left hand: Secondary | ICD-10-CM

## 2017-09-21 DIAGNOSIS — Z8739 Personal history of other diseases of the musculoskeletal system and connective tissue: Secondary | ICD-10-CM | POA: Diagnosis not present

## 2017-09-21 DIAGNOSIS — M7061 Trochanteric bursitis, right hip: Secondary | ICD-10-CM

## 2017-09-21 DIAGNOSIS — Z8639 Personal history of other endocrine, nutritional and metabolic disease: Secondary | ICD-10-CM | POA: Diagnosis not present

## 2017-09-21 DIAGNOSIS — Z8679 Personal history of other diseases of the circulatory system: Secondary | ICD-10-CM | POA: Diagnosis not present

## 2017-09-21 DIAGNOSIS — Z8719 Personal history of other diseases of the digestive system: Secondary | ICD-10-CM

## 2017-09-21 DIAGNOSIS — Z87898 Personal history of other specified conditions: Secondary | ICD-10-CM

## 2017-09-21 DIAGNOSIS — M19071 Primary osteoarthritis, right ankle and foot: Secondary | ICD-10-CM

## 2017-09-21 DIAGNOSIS — M8589 Other specified disorders of bone density and structure, multiple sites: Secondary | ICD-10-CM | POA: Diagnosis not present

## 2017-09-21 DIAGNOSIS — M170 Bilateral primary osteoarthritis of knee: Secondary | ICD-10-CM

## 2017-09-21 DIAGNOSIS — M19072 Primary osteoarthritis, left ankle and foot: Secondary | ICD-10-CM

## 2017-09-21 MED ORDER — DICLOFENAC SODIUM 1.5 % TD SOLN: 10.0000 [drp] | Freq: Two times a day (BID) | TRANSDERMAL | 1 refills | Status: AC

## 2017-09-21 MED ORDER — DICLOFENAC SODIUM 1.5 % TD SOLN: 10.0000 [drp] | Freq: Two times a day (BID) | TRANSDERMAL | 1 refills | Status: DC

## 2017-09-21 NOTE — Patient Instructions (Signed)
Hand Exercises Hand exercises can be helpful to almost anyone. These exercises can strengthen the hands, improve flexibility and movement, and increase blood flow to the hands. These results can make work and daily tasks easier. Hand exercises can be especially helpful for people who have joint pain from arthritis or have nerve damage from overuse (carpal tunnel syndrome). These exercises can also help people who have injured a hand. Most of these hand exercises are fairly gentle stretching routines. You can do them often throughout the day. Still, it is a good idea to ask your health care provider which exercises would be best for you. Warming your hands before exercise may help to reduce stiffness. You can do this with gentle massage or by placing your hands in warm water for 15 minutes. Also, make sure you pay attention to your level of hand pain as you begin an exercise routine. Exercises Knuckle Bend Repeat this exercise 5-10 times with each hand. 1. Stand or sit with your arm, hand, and all five fingers pointed straight up. Make sure your wrist is straight. 2. Gently and slowly bend your fingers down and inward until the tips of your fingers are touching the tops of your palm. 3. Hold this position for a few seconds. 4. Extend your fingers out to their original position, all pointing straight up again.  Finger Fan Repeat this exercise 5-10 times with each hand. 1. Hold your arm and hand out in front of you. Keep your wrist straight. 2. Squeeze your hand into a fist. 3. Hold this position for a few seconds. 4. Fan out, or spread apart, your hand and fingers as much as possible, stretching every joint fully.  Tabletop Repeat this exercise 5-10 times with each hand. 1. Stand or sit with your arm, hand, and all five fingers pointed straight up. Make sure your wrist is straight. 2. Gently and slowly bend your fingers at the knuckles where they meet the hand until your hand is making an  upside-down L shape. Your fingers should form a tabletop. 3. Hold this position for a few seconds. 4. Extend your fingers out to their original position, all pointing straight up again.  Making Os Repeat this exercise 5-10 times with each hand. 1. Stand or sit with your arm, hand, and all five fingers pointed straight up. Make sure your wrist is straight. 2. Make an O shape by touching your pointer finger to your thumb. Hold for a few seconds. Then open your hand wide. 3. Repeat this motion with each finger on your hand.  Table Spread Repeat this exercise 5-10 times with each hand. 1. Place your hand on a table with your palm facing down. Make sure your wrist is straight. 2. Spread your fingers out as much as possible. Hold this position for a few seconds. 3. Slide your fingers back together again. Hold for a few seconds.  Ball Grip  Repeat this exercise 10-15 times with each hand. 1. Hold a tennis ball or another soft ball in your hand. 2. While slowly increasing pressure, squeeze the ball as hard as possible. 3. Squeeze as hard as you can for 3-5 seconds. 4. Relax and repeat.  Wrist Curls Repeat this exercise 10-15 times with each hand. 1. Sit in a chair that has armrests. 2. Hold a light weight in your hand, such as a dumbbell that weighs 1-3 pounds (0.5-1.4 kg). Ask your health care provider what weight would be best for you. 3. Rest your hand just over   the end of the chair arm with your palm facing up. 4. Gently pivot your wrist up and down while holding the weight. Do not twist your wrist from side to side.  Contact a health care provider if:  Your hand pain or discomfort gets much worse when you do an exercise.  Your hand pain or discomfort does not improve within 2 hours after you exercise. If you have any of these problems, stop doing these exercises right away. Do not do them again unless your health care provider says that you can. Get help right away if:  You  develop sudden, severe hand pain. If this happens, stop doing these exercises right away. Do not do them again unless your health care provider says that you can. This information is not intended to replace advice given to you by your health care provider. Make sure you discuss any questions you have with your health care provider. Document Released: 10/13/2015 Document Revised: 04/08/2016 Document Reviewed: 05/12/2015 Elsevier Interactive Patient Education  2018 [employer] Knee Exercises Ask your health care provider which exercises are safe for you. Do exercises exactly as told by your health care provider and adjust them as directed. It is normal to feel mild stretching, pulling, tightness, or discomfort as you do these exercises, but you should stop right away if you feel sudden pain or your pain gets worse.Do not begin these exercises until told by your health care provider. STRETCHING AND RANGE OF MOTION EXERCISES These exercises warm up your muscles and joints and improve the movement and flexibility of your knee. These exercises also help to relieve pain, numbness, and tingling. Exercise A: Knee Extension, Prone 5. Lie on your abdomen on a bed. 6. Place your left / right knee just beyond the edge of the surface so your knee is not on the bed. You can put a towel under your left / right thigh just above your knee for comfort. 7. Relax your leg muscles and allow gravity to straighten your knee. You should feel a stretch behind your left / right knee. 8. Hold this position for __________ seconds. 9. Scoot up so your knee is supported between repetitions. Repeat __________ times. Complete this stretch __________ times a day. Exercise B: Knee Flexion, Active  5. Lie on your back with both knees straight. If this causes back discomfort, bend your left / right knee so your foot is flat on the floor. 6. Slowly slide your left / right heel back toward your buttocks until you feel a gentle  stretch in the front of your knee or thigh. 7. Hold this position for __________ seconds. 8. Slowly slide your left / right heel back to the starting position. Repeat __________ times. Complete this exercise __________ times a day. Exercise C: Quadriceps, Prone  5. Lie on your abdomen on a firm surface, such as a bed or padded floor. 6. Bend your left / right knee and hold your ankle. If you cannot reach your ankle or pant leg, loop a belt around your foot and grab the belt instead. 7. Gently pull your heel toward your buttocks. Your knee should not slide out to the side. You should feel a stretch in the front of your thigh and knee. 8. Hold this position for __________ seconds. Repeat __________ times. Complete this stretch __________ times a day. Exercise D: Hamstring, Supine 1. Lie on your back. 2. Loop a belt or towel over the ball of your left / right foot. The ball of   your foot is on the walking surface, right under your toes. 3. Straighten your left / right knee and slowly pull on the belt to raise your leg until you feel a gentle stretch behind your knee. ? Do not let your left / right knee bend while you do this. ? Keep your other leg flat on the floor. 4. Hold this position for __________ seconds. Repeat __________ times. Complete this stretch __________ times a day. STRENGTHENING EXERCISES These exercises build strength and endurance in your knee. Endurance is the ability to use your muscles for a long time, even after they get tired. Exercise E: Quadriceps, Isometric  4. Lie on your back with your left / right leg extended and your other knee bent. Put a rolled towel or small pillow under your knee if told by your health care provider. 5. Slowly tense the muscles in the front of your left / right thigh. You should see your kneecap slide up toward your hip or see increased dimpling just above the knee. This motion will push the back of the knee toward the floor. 6. For __________  seconds, keep the muscle as tight as you can without increasing your pain. 7. Relax the muscles slowly and completely. Repeat __________ times. Complete this exercise __________ times a day. Exercise F: Straight Leg Raises - Quadriceps 5. Lie on your back with your left / right leg extended and your other knee bent. 6. Tense the muscles in the front of your left / right thigh. You should see your kneecap slide up or see increased dimpling just above the knee. Your thigh may even shake a bit. 7. Keep these muscles tight as you raise your leg 4-6 inches (10-15 cm) off the floor. Do not let your knee bend. 8. Hold this position for __________ seconds. 9. Keep these muscles tense as you lower your leg. 10. Relax your muscles slowly and completely after each repetition. Repeat __________ times. Complete this exercise __________ times a day. Exercise G: Hamstring, Isometric 5. Lie on your back on a firm surface. 6. Bend your left / right knee approximately __________ degrees. 7. Dig your left / right heel into the surface as if you are trying to pull it toward your buttocks. Tighten the muscles in the back of your thighs to dig as hard as you can without increasing any pain. 8. Hold this position for __________ seconds. 9. Release the tension gradually and allow your muscles to relax completely for __________ seconds after each repetition. Repeat __________ times. Complete this exercise __________ times a day. Exercise H: Hamstring Curls  If told by your health care provider, do this exercise while wearing ankle weights. Begin with __________ weights. Then increase the weight by 1 lb (0.5 kg) increments. Do not wear ankle weights that are more than __________. 1. Lie on your abdomen with your legs straight. 2. Bend your left / right knee as far as you can without feeling pain. Keep your hips flat against the floor. 3. Hold this position for __________ seconds. 4. Slowly lower your leg to the  starting position.  Repeat __________ times. Complete this exercise __________ times a day. Exercise I: Squats (Quadriceps) 1. Stand in front of a table, with your feet and knees pointing straight ahead. You may rest your hands on the table for balance but not for support. 2. Slowly bend your knees and lower your hips like you are going to sit in a chair. ? Keep your weight over your heels,   not over your toes. ? Keep your lower legs upright so they are parallel with the table legs. ? Do not let your hips go lower than your knees. ? Do not bend lower than told by your health care provider. ? If your knee pain increases, do not bend as low. 3. Hold the squat position for __________ seconds. 4. Slowly push with your legs to return to standing. Do not use your hands to pull yourself to standing. Repeat __________ times. Complete this exercise __________ times a day. Exercise J: Wall Slides (Quadriceps)  1. Lean your back against a smooth wall or door while you walk your feet out 18-24 inches (46-61 cm) from it. 2. Place your feet hip-width apart. 3. Slowly slide down the wall or door until your knees bend __________ degrees. Keep your knees over your heels, not over your toes. Keep your knees in line with your hips. 4. Hold for __________ seconds. Repeat __________ times. Complete this exercise __________ times a day. Exercise K: Straight Leg Raises - Hip Abductors 1. Lie on your side with your left / right leg in the top position. Lie so your head, shoulder, knee, and hip line up. You may bend your bottom knee to help you keep your balance. 2. Roll your hips slightly forward so your hips are stacked directly over each other and your left / right knee is facing forward. 3. Leading with your heel, lift your top leg 4-6 inches (10-15 cm). You should feel the muscles in your outer hip lifting. ? Do not let your foot drift forward. ? Do not let your knee roll toward the ceiling. 4. Hold this  position for __________ seconds. 5. Slowly return your leg to the starting position. 6. Let your muscles relax completely after each repetition. Repeat __________ times. Complete this exercise __________ times a day. Exercise L: Straight Leg Raises - Hip Extensors 1. Lie on your abdomen on a firm surface. You can put a pillow under your hips if that is more comfortable. 2. Tense the muscles in your buttocks and lift your left / right leg about 4-6 inches (10-15 cm). Keep your knee straight as you lift your leg. 3. Hold this position for __________ seconds. 4. Slowly lower your leg to the starting position. 5. Let your leg relax completely after each repetition. Repeat __________ times. Complete this exercise __________ times a day. This information is not intended to replace advice given to you by your health care provider. Make sure you discuss any questions you have with your health care provider. Document Released: 09/15/2005 Document Revised: 07/26/2016 Document Reviewed: 09/07/2015 Elsevier Interactive Patient Education  2018 [employer]  

## 2017-10-13 ENCOUNTER — Encounter: Payer: Medicare Other | Attending: Endocrinology | Admitting: Dietician

## 2017-10-13 DIAGNOSIS — Z6822 Body mass index (BMI) 22.0-22.9, adult: Secondary | ICD-10-CM | POA: Diagnosis not present

## 2017-10-13 DIAGNOSIS — Z713 Dietary counseling and surveillance: Secondary | ICD-10-CM | POA: Insufficient documentation

## 2017-10-13 DIAGNOSIS — E1165 Type 2 diabetes mellitus with hyperglycemia: Secondary | ICD-10-CM | POA: Insufficient documentation

## 2017-10-13 DIAGNOSIS — E118 Type 2 diabetes mellitus with unspecified complications: Secondary | ICD-10-CM

## 2017-10-13 NOTE — Patient Instructions (Signed)
Continue to take your medications as prescribed. Avoid skipping meals.  Aim to have at least 30 grams of carbohydrates for lunch. Small amounts of protein with each meal and snack. Work to drink more water and decrease the sugar in other beverages.  Consider checking your blood sugar 2 hours after you eat.

## 2017-10-13 NOTE — Progress Notes (Signed)
Diabetes Self-Management Education  Visit Type:  Follow-up  Appt. Start Time: 1015 Appt. End Time: 1045  10/13/2017  [name], identified by name and date of birth, is a 71 y.o. female with a diagnosis of Diabetes: Type 2.  Other history includes HTN and hyperlipidemia.  Patient is here today alone.  She is still using sugar in her tea but states that she is drinking more water and less regular soda and tea.  She is also now using splenda rather than sugar in her coffee.  Her husband is on insulin but only wants his tea sweetened with sugar and she does not want to make 2 kinds.  She now seems to be taking her medication as prescribed.  She was instructed to check her blood sugar after meals but review of meter shows she is checking it once daily mostly before breakfast and occasionally after breakfast.  Readings are generally WNL.  Patient lives with her husband.  They share shopping and cooking.  She states that she is a picky eater.  Generally does not like water.  She works as a caregiver 2 days per week.  ASSESSMENT  Weight 134 lb (60.8 kg). Body mass index is 23.74 kg/m.   Diabetes Self-Management Education - 10/13/17 1100      Pre-Education Assessment   Patient understands the diabetes disease and treatment process.  Demonstrates understanding / competency    Patient understands incorporating nutritional management into lifestyle.  Needs Review    Patient undertands incorporating physical activity into lifestyle.  Needs Review    Patient understands using medications safely.  Demonstrates understanding / competency    Patient understands monitoring blood glucose, interpreting and using results  Demonstrates understanding / competency    Patient understands prevention, detection, and treatment of acute complications.  Demonstrates understanding / competency    Patient understands prevention, detection, and treatment of chronic complications.  Demonstrates understanding /  competency    Patient understands how to develop strategies to address psychosocial issues.  Needs Review    Patient understands how to develop strategies to promote health/change behavior.  Needs Review      Complications   How often do you check your blood sugar?  1-2 times/day    Fasting Blood glucose range (mg/dL)  [phone]    Postprandial Blood glucose range (mg/dL)  [phone]    Number of hypoglycemic episodes per month  2    Can you tell when your blood sugar is low?  Yes      Dietary Intake   Breakfast  plain cheerios with milk OR oatmeal with splenda, applesauce, and sugar    Lunch  skips or crackers and cream cheese or activia    Dinner  fish or chicken , potatoes or rice or corn, vegetables    Snack (evening)  actevia yogurt    Beverage(s)  water, coffee with splenda, green tea with sugar, occasional regular gingerale      Exercise   Exercise Type  Light (walking / raking leaves)      Patient Education   Previous Diabetes Education  Yes (please comment)    Nutrition management   Meal timing in regards to the patients' current diabetes medication.;Meal options for control of blood glucose level and chronic complications.    Medications  Reviewed patients medication for diabetes, action, purpose, timing of dose and side effects.    Psychosocial adjustment  Worked with patient to identify barriers to care and solutions;Identified and addressed patients feelings and concerns  about diabetes;Brainstormed with patient on coping mechanisms for social situations, getting support from significant others, dealing with feelings about diabetes      Individualized Goals (developed by patient)   Nutrition  General guidelines for healthy choices and portions discussed    Physical Activity  Exercise 3-5 times per week;30 minutes per day    Medications  take my medication as prescribed    Monitoring   test my blood glucose as discussed    Health Coping  discuss diabetes with (comment) MD,RD,CDE       Patient Self-Evaluation of Goals - Patient rates self as meeting previously set goals (% of time)   Nutrition  25 - 50%    Physical Activity  25 - 50%    Medications  >75%    Monitoring  25 - 50%    Problem Solving  50 - 75 %    Reducing Risk  25 - 50%    Health Coping  50 - 75 %      Post-Education Assessment   Patient understands the diabetes disease and treatment process.  Demonstrates understanding / competency    Patient understands incorporating nutritional management into lifestyle.  Needs Review    Patient undertands incorporating physical activity into lifestyle.  Demonstrates understanding / competency    Patient understands using medications safely.  Demonstrates understanding / competency    Patient understands monitoring blood glucose, interpreting and using results  Demonstrates understanding / competency    Patient understands prevention, detection, and treatment of acute complications.  Demonstrates understanding / competency    Patient understands prevention, detection, and treatment of chronic complications.  Demonstrates understanding / competency    Patient understands how to develop strategies to address psychosocial issues.  Needs Review    Patient understands how to develop strategies to promote health/change behavior.  Needs Review      Outcomes   Program Status  Completed      Subsequent Visit   Since your last visit have you continued or begun to take your medications as prescribed?  Yes    Since your last visit have you experienced any weight changes?  No change    Since your last visit, are you checking your blood glucose at least once a day?  Yes       Learning Objective:  Patient will have a greater understanding of diabetes self-management. Patient education plan is to attend individual and/or group sessions per assessed needs and concerns.   Plan:   Patient Instructions  Continue to take your medications as prescribed. Avoid skipping meals.   Aim to have at least 30 grams of carbohydrates for lunch. Small amounts of protein with each meal and snack. Work to drink more water and decrease the sugar in other beverages.  Consider checking your blood sugar 2 hours after you eat.    Expected Outcomes:  Demonstrated interest in learning. Expect positive outcomes  Education material provided: none  If problems or questions, patient to contact team via:  Phone  Future DSME appointment: - 3-4 months

## 2017-11-04 ENCOUNTER — Other Ambulatory Visit: Payer: Self-pay | Admitting: Gastroenterology

## 2017-11-04 ENCOUNTER — Ambulatory Visit
Admission: RE | Admit: 2017-11-04 | Discharge: 2017-11-04 | Disposition: A | Discharge status: Home / Self Care | Payer: Medicare Other | Source: Ambulatory Visit | Attending: Gastroenterology | Admitting: Gastroenterology

## 2017-11-04 DIAGNOSIS — M79604 Pain in right leg: Secondary | ICD-10-CM

## 2017-11-04 DIAGNOSIS — M7989 Other specified soft tissue disorders: Principal | ICD-10-CM

## 2017-11-05 ENCOUNTER — Other Ambulatory Visit: Payer: Self-pay | Admitting: Internal Medicine

## 2017-11-23 ENCOUNTER — Telehealth: Payer: Self-pay | Admitting: Internal Medicine

## 2017-11-23 DIAGNOSIS — M7121 Synovial cyst of popliteal space [Baker], right knee: Secondary | ICD-10-CM

## 2017-11-23 NOTE — Telephone Encounter (Signed)
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   Advised patient, due to above I recommend a orthopedic referral, please arrange.

## 2017-11-23 NOTE — Telephone Encounter (Addendum)
Tried calling Pt, no answer, unable to leave message. Referral placed to [facility]- however Dr. Drue NovelPaz no longer PCP.

## 2017-12-07 ENCOUNTER — Telehealth: Payer: Self-pay | Admitting: Rheumatology

## 2017-12-07 NOTE — Telephone Encounter (Signed)
Per patient rt knee is hurting now. Pain has been progressing now for about two weeks. Patient has iced it, and tried ASA with no relief. Please call patient to advise. [facility] uses [facility] on [address]. Please call to advise.

## 2017-12-07 NOTE — Telephone Encounter (Signed)
Attempted to contact the patient and unable to leave a message as mailbox was full.

## 2017-12-20 ENCOUNTER — Ambulatory Visit: Payer: Medicare Other | Admitting: Podiatry

## 2018-01-09 ENCOUNTER — Telehealth: Payer: Self-pay | Admitting: Rheumatology

## 2018-01-09 NOTE — Telephone Encounter (Signed)
Patient has been scheduled for 01/10/18 at 8:40 am.

## 2018-01-09 NOTE — Progress Notes (Signed)
Office Visit Note  Patient: [name] [name]             Date of Birth: [birth year]           MRN: [id number]             PCP: Dorothyann Peng, MD Referring: Dorothyann Peng, MD Visit Date: 01/10/2018 Occupation: @GUAROCC @    Subjective:  Left knee pain   History of Present Illness: [name] [name] is a 72 y.o. female with history of osteoarthritis, CPPD, and osteopenia.  Patient states that 3 days ago she was up on her feet a lot, and afterwards she developed pain in bilateral knees and right shoulder.  She denies any injury or fall. She states that her left knee is more severe and has been swelling.  She states she has been taking Aspirin for pain relief, which has not been helping.  She has significant joint stiffness and has had difficulty walking the past few days. She has been using a cane to help her walk, which she has not needed in the past.  She states that she has good ROM in bilateral shoulders.  She states the pain in her right shoulder is worse with lifting objects.  She states that her trochanteric bursitis has been doing good.  She has not been using Pennsaid.  She denies any CPPD flares recently, and she has not needed colchicine.  She states she was taking it PRN, but it causes nausea.    Activities of Daily Living:  Patient reports morning stiffness for  all day.   Patient Reports nocturnal pain.  Difficulty dressing/grooming: Denies Difficulty climbing stairs: Reports Difficulty getting out of chair: Reports Difficulty using hands for taps, buttons, cutlery, and/or writing: Denies   Review of Systems  Constitutional: Negative for fatigue and weakness.  HENT: Negative for mouth sores, trouble swallowing, trouble swallowing, mouth dryness and nose dryness.   Eyes: Negative for pain, redness, visual disturbance and dryness.  Respiratory: Negative for cough, hemoptysis, shortness of breath and difficulty breathing.   Cardiovascular: Negative for chest pain,  palpitations, hypertension, irregular heartbeat and swelling in legs/feet.  Gastrointestinal: Negative for blood in stool, constipation and diarrhea.  Endocrine: Negative for increased urination.  Genitourinary: Negative for painful urination.  Musculoskeletal: Positive for arthralgias, joint pain, joint swelling and morning stiffness. Negative for myalgias, muscle weakness, muscle tenderness and myalgias.  Skin: Negative for color change, pallor, rash, hair loss, nodules/bumps, redness, skin tightness, ulcers and sensitivity to sunlight.  Allergic/Immunologic: Negative for susceptible to infections.  Neurological: Negative for dizziness, light-headedness, numbness and headaches.  Hematological: Negative for swollen glands.  Psychiatric/Behavioral: Negative for depressed mood and sleep disturbance. The patient is not nervous/anxious.     PMFS History:  Patient Active Problem List   Diagnosis Date Noted  . 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 History of hypertension 12/08/2016  . History of diabetes mellitus 12/08/2016  . Dyslipidemia 12/08/2016  . Osteoarthritis of both hands 09/14/2016  . Osteoarthritis of both feet 09/14/2016  . Osteoarthritis of both knees 09/14/2016  . PCP NOTES >>> 08/19/2015  . Low back pain 09/09/2014  . Neuralgia of left saphenous nerve 06/05/2014  . Numbness and tingling in left arm 06/05/2014  . Paresthesia of left arm 05/21/2014  . Gastroparesis 02/20/2013  . Insomnia 02/20/2013  . Annual physical exam 03/31/2012  . History of diverticulitis 04/14/2010  . Osteopenia 05/14/2009  . DYSPHAGIA UNSPECIFIED 01/10/2009  . 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 Hyperlipidemia 06/14/2007  .  Essential hypertension 06/14/2007  . Osteoarthritis 06/14/2007  . DM (diabetes mellitus) (HCC) 01/02/2007    Past Medical History:  Diagnosis Date  . Bilateral knee pain    Euflexxa x3  . Chest pain 06/2007   CP and QW on EKG: neg stress test   . Diabetes mellitus    w/  neuropathy  . Diverticulitis 2000  . Hyperlipidemia   . Hypertension   . Insomnia 02/20/2013  . Osteoarthritis    gout, CPPD--- sees Dr.Davenshwar  . Osteoarthritis of both feet 09/14/2016  . Osteoarthritis of both hands 09/14/2016  . Osteoarthritis of both knees 09/14/2016  . Osteopenia    per DEXA 4/10  . Recurrent boils    h/o MRSA    Family History  Problem Relation Age of Onset  . Hypertension Mother   . Diabetes Mother   . Hypertension Father   . Diabetes Father   . Hypertension Daughter   . Hypertension Sister   . Diabetes Sister   . Coronary artery disease Neg Hx   . Stroke Neg Hx   . Colon cancer Neg Hx   . Breast cancer Neg Hx    Past Surgical History:  Procedure Laterality Date  . CHOLECYSTECTOMY    . TONSILLECTOMY     Social History   Social History Narrative   Lives w/ husband   Patient is married with 3 children.   Patient is right handed.   Patient has college education.   Patient drinks 3-4 daily.     Objective: Vital Signs: BP 116/65 (BP Location: Left Arm, Patient Position: Sitting, Cuff Size: Normal)   Pulse 85   Resp 17   Ht 5\' 2"  (1.575 m)   Wt 139 lb (63 kg)   BMI 25.42 kg/m    Physical Exam  Constitutional: She is oriented to person, place, and time. She appears well-developed and well-nourished.  HENT:  Head: Normocephalic and atraumatic.  Eyes: Conjunctivae and EOM are normal.  Neck: Normal range of motion.  Cardiovascular: Normal rate, regular rhythm, normal heart sounds and intact distal pulses.  Pulmonary/Chest: Effort normal and breath sounds normal.  Abdominal: Soft. Bowel sounds are normal.  Lymphadenopathy:    She has no cervical adenopathy.  Neurological: She is alert and oriented to person, place, and time.  Skin: Skin is warm and dry. Capillary refill takes less than 2 seconds.  Psychiatric: She has a normal mood and affect. Her behavior is normal.  Nursing note and vitals reviewed.    Musculoskeletal Exam:  C-spine, thoracic, and lumbar spine good ROM.  No midline spinal tenderness.  no SI joint tenderness.  Shoulder joints, elbow joints, wrist joints, MCPs, PIPs, and DIPs good ROM with no synovitis.  She has PIP and DIP synovial thickening consistent with osteoarthritis.  She has no tenderness on exam.  She has bilateral CMC joint prominences.  Hip joints, knee joints, ankle joints, MTPs, PIPs, and DIPs good ROM with no synovitis. Warmth of left knee.  Bilateral knee crepitus.  No tenderness of trochanteric bursa bilaterally.     CDAI Exam: No CDAI exam completed.    Investigation: No additional findings. CBC Latest Ref Rng & Units 02/08/2017 12/09/2016 04/02/2016  WBC 4.0 - 10.5 K/uL 8.3 7.2 5.3  Hemoglobin 12.0 - 15.0 g/dL 16.1 09.6 04.5  Hematocrit 36.0 - 46.0 % 39.4 40.2 37  Platelets 150.0 - 400.0 K/uL 290.0 304 309   CMP Latest Ref Rng & Units 07/22/2017 02/08/2017 12/09/2016  Glucose 70 - 99 mg/dL  122(H) 206(H) 195(H)  BUN 6 - 23 mg/dL 8 6 10   Creatinine 0.40 - 1.20 mg/dL 4.09 8.11 9.14  Sodium 135 - 145 mEq/L 142 137 138  Potassium 3.5 - 5.1 mEq/L 3.9 4.0 4.2  Chloride 96 - 112 mEq/L 104 102 102  CO2 19 - 32 mEq/L 27 27 22   Calcium 8.4 - 10.5 mg/dL 78.2 9.9 9.7  Total Protein 6.0 - 8.3 g/dL - 7.3 6.7  Total Bilirubin 0.2 - 1.2 mg/dL - 0.4 0.5  Alkaline Phos 39 - 117 U/L - 74 73  AST 0 - 37 U/L - 13 11  ALT 0 - 35 U/L - 23 12    Imaging: Xr Knee 3 View Left  Result Date: 01/10/2018 Severe medial compartment narrowing with chondrocalcinosis noted.  Medial, intercondylar and lateral osteophytes were noted.  Severe patellofemoral narrowing was noted. 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  Xr Knee 3 View Right  Result Date: 01/10/2018 Moderate to severe medial compartment narrowing with chondrocalcinosis noted.  Medial, intercondylar and lateral osteophytes were noted.  Severe patellofemoral narrowing was noted. Impression: Moderate  to severe osteoarthritis with chondrocalcinosis and severe chondromalacia patella.   Speciality Comments: No specialty comments available.    Procedures:  Large Joint Inj: L knee on 01/10/2018 9:28 AM Indications: pain Details: 27 G 1.5 in needle, medial approach  Arthrogram: No  Medications: 1.5 mL lidocaine 1 %; 40 mg triamcinolone acetonide 40 MG/ML Aspirate: 0 mL Outcome: tolerated well, no immediate complications Procedure, treatment alternatives, risks and benefits explained, specific risks discussed. Consent was given by the patient. Immediately prior to procedure a time out was called to verify the correct patient, procedure, equipment, support staff and site/side marked as required. Patient was prepped and draped in the usual sterile fashion.     Allergies: Colcrys [colchicine]; Hydrocodone; Sulindac; Tramadol; Celecoxib; and Glimepiride   Assessment / Plan:     Visit Diagnoses: Primary osteoarthritis of both knees - She reports bilateral knee pain that started 3 days ago after standing for a prolonged period of time.  She has bilateral knee crepitus and warmth in her left knee. X-rays of her bilateral knees were performed today. She also had a cortisone injection of her left knee today. She was given a hangout of knee exercises she can perform at home.   Primary osteoarthritis of both feet: She has no discomfort at this time.  Discussed wearing proper fitting shoes.     Primary osteoarthritis of both hands: She has PIP and DIP synovial thickening consistent with osteoarthritis.  Joint protection and muscle strengthening were discussed.    Chronic pain of both knees -She has bilateral knee pain, worse in left knee. Left knee x-ray revealed severe osteoarthritis.  Right knee revealed moderate to severe osteoarthritis. She has chondrocalcinosis in bilateral knees. Plan: XR KNEE 3 VIEW LEFT, XR KNEE 3 VIEW RIGHT   History of calcium pyrophosphate deposition disease (CPPD) - She  is having a flare in her left knee.  It is warm on exam.  She has significant pain in her left knee and has difficulty walking.  She has been using a cane for assistance the past few days. She has not been taking Colchicine 0.6 due to it causing nausea.  She requested a cortisone injection of her left knee.  She tolerated the procedure well. She was advised to monitor her blood pressure and blood glucose.  She can also elevate her knee and ice it.  She can take  ibuprofen PRN since she cannot tolerate Colchicine.   Trochanteric bursitis of both hips: Resolved.  No tenderness on exam today.   Osteopenia of multiple sites - She takes Calcium and vitamin D.   History of diabetes mellitus: She was advised to check her blood glucose level on a regular basis.   History of hypertension: She should continue to monitor her blood pressure regularly.   Chronic right shoulder pain: She has full ROM of bilateral shoulders on exam.  No crepitus or warmth.  She was given a handout of shoulder exercises she can perform at home.    Other medical conditions are listed as follows:   History of diverticulitis  History of hyperlipidemia  History of insomnia    Orders: Orders Placed This Encounter  Procedures  . Large Joint Inj  . XR KNEE 3 VIEW LEFT  . XR KNEE 3 VIEW RIGHT   No orders of the defined types were placed in this encounter.   Face-to-face time spent with patient was 30 minutes. Greater than 50% of time was spent in counseling and coordination of care.  Follow-Up Instructions: Return in about 6 months (around 07/10/2018) for Osteoarthritis, CPPD, Osteopenia .   Gearldine Bienenstockaylor M Dale, PA-C  Note - This record has been created using Dragon software.  Chart creation errors have been sought, but may not always  have been located. Such creation errors do not reflect on  the standard of medical care.

## 2018-01-09 NOTE — Telephone Encounter (Signed)
Patient has been experiencing increased pain in both knees, and rt side upper chest ares for three days now. Patient has been using Lidocaine patches, and taking ASA 81 mg without much relief. Patient has rov scheduled for May. [facility] [address] Please call to advise.

## 2018-01-10 ENCOUNTER — Ambulatory Visit: Payer: Medicare Other | Admitting: Physician Assistant

## 2018-01-10 ENCOUNTER — Ambulatory Visit (INDEPENDENT_AMBULATORY_CARE_PROVIDER_SITE_OTHER): Payer: Self-pay

## 2018-01-10 ENCOUNTER — Ambulatory Visit (INDEPENDENT_AMBULATORY_CARE_PROVIDER_SITE_OTHER): Payer: Medicare Other | Admitting: Physician Assistant

## 2018-01-10 ENCOUNTER — Encounter: Payer: Self-pay | Admitting: Physician Assistant

## 2018-01-10 VITALS — BP 116/65 | HR 85 | Resp 17 | Ht 62.0 in | Wt 139.0 lb

## 2018-01-10 DIAGNOSIS — M170 Bilateral primary osteoarthritis of knee: Secondary | ICD-10-CM

## 2018-01-10 DIAGNOSIS — Z8719 Personal history of other diseases of the digestive system: Secondary | ICD-10-CM | POA: Diagnosis not present

## 2018-01-10 DIAGNOSIS — M7061 Trochanteric bursitis, right hip: Secondary | ICD-10-CM | POA: Diagnosis not present

## 2018-01-10 DIAGNOSIS — M8589 Other specified disorders of bone density and structure, multiple sites: Secondary | ICD-10-CM | POA: Diagnosis not present

## 2018-01-10 DIAGNOSIS — M19041 Primary osteoarthritis, right hand: Secondary | ICD-10-CM

## 2018-01-10 DIAGNOSIS — M25562 Pain in left knee: Secondary | ICD-10-CM | POA: Diagnosis not present

## 2018-01-10 DIAGNOSIS — M19042 Primary osteoarthritis, left hand: Secondary | ICD-10-CM

## 2018-01-10 DIAGNOSIS — M25511 Pain in right shoulder: Secondary | ICD-10-CM | POA: Diagnosis not present

## 2018-01-10 DIAGNOSIS — Z87898 Personal history of other specified conditions: Secondary | ICD-10-CM

## 2018-01-10 DIAGNOSIS — Z8639 Personal history of other endocrine, nutritional and metabolic disease: Secondary | ICD-10-CM | POA: Diagnosis not present

## 2018-01-10 DIAGNOSIS — G8929 Other chronic pain: Secondary | ICD-10-CM | POA: Diagnosis not present

## 2018-01-10 DIAGNOSIS — M7062 Trochanteric bursitis, left hip: Secondary | ICD-10-CM

## 2018-01-10 DIAGNOSIS — Z8679 Personal history of other diseases of the circulatory system: Secondary | ICD-10-CM | POA: Diagnosis not present

## 2018-01-10 DIAGNOSIS — M19071 Primary osteoarthritis, right ankle and foot: Secondary | ICD-10-CM | POA: Diagnosis not present

## 2018-01-10 DIAGNOSIS — Z8739 Personal history of other diseases of the musculoskeletal system and connective tissue: Secondary | ICD-10-CM | POA: Diagnosis not present

## 2018-01-10 DIAGNOSIS — M19072 Primary osteoarthritis, left ankle and foot: Secondary | ICD-10-CM

## 2018-01-10 DIAGNOSIS — M25561 Pain in right knee: Secondary | ICD-10-CM | POA: Diagnosis not present

## 2018-01-10 MED ORDER — TRIAMCINOLONE ACETONIDE 40 MG/ML IJ SUSP
40.0000 mg | INTRAMUSCULAR | Status: AC | PRN
  Administered 2018-01-10: 40 mg via INTRA_ARTICULAR

## 2018-01-10 MED ORDER — LIDOCAINE HCL 1 % IJ SOLN
1.5000 mL | INTRAMUSCULAR | Status: AC | PRN
  Administered 2018-01-10: 1.5 mL

## 2018-01-10 NOTE — Patient Instructions (Addendum)
Shoulder Exercises Ask your health care provider which exercises are safe for you. Do exercises exactly as told by your health care provider and adjust them as directed. It is normal to feel mild stretching, pulling, tightness, or discomfort as you do these exercises, but you should stop right away if you feel sudden pain or your pain gets worse.Do not begin these exercises until told by your health care provider. RANGE OF MOTION EXERCISES These exercises warm up your muscles and joints and improve the movement and flexibility of your shoulder. These exercises also help to relieve pain, numbness, and tingling. These exercises involve stretching your injured shoulder directly. Exercise A: Pendulum  1. Stand near a wall or a surface that you can hold onto for balance. 2. Bend at the waist and let your left / right arm hang straight down. Use your other arm to support you. Keep your back straight and do not lock your knees. 3. Relax your left / right arm and shoulder muscles, and move your hips and your trunk so your left / right arm swings freely. Your arm should swing because of the motion of your body, not because you are using your arm or shoulder muscles. 4. Keep moving your body so your arm swings in the following directions, as told by your health care provider: ? Side to side. ? Forward and backward. ? In clockwise and counterclockwise circles. 5. Continue each motion for __________ seconds, or for as long as told by your health care provider. 6. Slowly return to the starting position. Repeat __________ times. Complete this exercise __________ times a day. Exercise B:Flexion, Standing  1. Stand and hold a broomstick, a cane, or a similar object. Place your hands a little more than shoulder-width apart on the object. Your left / right hand should be palm-up, and your other hand should be palm-down. 2. Keep your elbow straight and keep your shoulder muscles relaxed. Push the stick down with  your healthy arm to raise your left / right arm in front of your body, and then over your head until you feel a stretch in your shoulder. ? Avoid shrugging your shoulder while you raise your arm. Keep your shoulder blade tucked down toward the middle of your back. 3. Hold for __________ seconds. 4. Slowly return to the starting position. Repeat __________ times. Complete this exercise __________ times a day. Exercise C: Abduction, Standing 1. Stand and hold a broomstick, a cane, or a similar object. Place your hands a little more than shoulder-width apart on the object. Your left / right hand should be palm-up, and your other hand should be palm-down. 2. While keeping your elbow straight and your shoulder muscles relaxed, push the stick across your body toward your left / right side. Raise your left / right arm to the side of your body and then over your head until you feel a stretch in your shoulder. ? Do not raise your arm above shoulder height, unless your health care provider tells you to do that. ? Avoid shrugging your shoulder while you raise your arm. Keep your shoulder blade tucked down toward the middle of your back. 3. Hold for __________ seconds. 4. Slowly return to the starting position. Repeat __________ times. Complete this exercise __________ times a day. Exercise D:Internal Rotation  1. Place your left / right hand behind your back, palm-up. 2. Use your other hand to dangle an exercise band, a towel, or a similar object over your shoulder. Grasp the band with   your left / right hand so you are holding onto both ends. 3. Gently pull up on the band until you feel a stretch in the front of your left / right shoulder. ? Avoid shrugging your shoulder while you raise your arm. Keep your shoulder blade tucked down toward the middle of your back. 4. Hold for __________ seconds. 5. Release the stretch by letting go of the band and lowering your hands. Repeat __________ times. Complete  this exercise __________ times a day. STRETCHING EXERCISES These exercises warm up your muscles and joints and improve the movement and flexibility of your shoulder. These exercises also help to relieve pain, numbness, and tingling. These exercises are done using your healthy shoulder to help stretch the muscles of your injured shoulder. Exercise E: Corner Stretch (External Rotation and Abduction)  1. Stand in a doorway with one of your feet slightly in front of the other. This is called a staggered stance. If you cannot reach your forearms to the door frame, stand facing a corner of a room. 2. Choose one of the following positions as told by your health care provider: ? Place your hands and forearms on the door frame above your head. ? Place your hands and forearms on the door frame at the height of your head. ? Place your hands on the door frame at the height of your elbows. 3. Slowly move your weight onto your front foot until you feel a stretch across your chest and in the front of your shoulders. Keep your head and chest upright and keep your abdominal muscles tight. 4. Hold for __________ seconds. 5. To release the stretch, shift your weight to your back foot. Repeat __________ times. Complete this stretch __________ times a day. Exercise F:Extension, Standing 1. Stand and hold a broomstick, a cane, or a similar object behind your back. ? Your hands should be a little wider than shoulder-width apart. ? Your palms should face away from your back. 2. Keeping your elbows straight and keeping your shoulder muscles relaxed, move the stick away from your body until you feel a stretch in your shoulder. ? Avoid shrugging your shoulders while you move the stick. Keep your shoulder blade tucked down toward the middle of your back. 3. Hold for __________ seconds. 4. Slowly return to the starting position. Repeat __________ times. Complete this exercise __________ times a day. STRENGTHENING  EXERCISES These exercises build strength and endurance in your shoulder. Endurance is the ability to use your muscles for a long time, even after they get tired. Exercise G:External Rotation  1. Sit in a stable chair without armrests. 2. Secure an exercise band at elbow height on your left / right side. 3. Place a soft object, such as a folded towel or a small pillow, between your left / right upper arm and your body to move your elbow a few inches away (about 10 cm) from your side. 4. Hold the end of the band so it is tight and there is no slack. 5. Keeping your elbow pressed against the soft object, move your left / right forearm out, away from your abdomen. Keep your body steady so only your forearm moves. 6. Hold for __________ seconds. 7. Slowly return to the starting position. Repeat __________ times. Complete this exercise __________ times a day. Exercise H:Shoulder Abduction  1. Sit in a stable chair without armrests, or stand. 2. Hold a __________ weight in your left / right hand, or hold an exercise band with both hands.   3. Start with your arms straight down and your left / right palm facing in, toward your body. 4. Slowly lift your left / right hand out to your side. Do not lift your hand above shoulder height unless your health care provider tells you that this is safe. ? Keep your arms straight. ? Avoid shrugging your shoulder while you do this movement. Keep your shoulder blade tucked down toward the middle of your back. 5. Hold for __________ seconds. 6. Slowly lower your arm, and return to the starting position. Repeat __________ times. Complete this exercise __________ times a day. Exercise I:Shoulder Extension 1. Sit in a stable chair without armrests, or stand. 2. Secure an exercise band to a stable object in front of you where it is at shoulder height. 3. Hold one end of the exercise band in each hand. Your palms should face each other. 4. Straighten your elbows and  lift your hands up to shoulder height. 5. Step back, away from the secured end of the exercise band, until the band is tight and there is no slack. 6. Squeeze your shoulder blades together as you pull your hands down to the sides of your thighs. Stop when your hands are straight down by your sides. Do not let your hands go behind your body. 7. Hold for __________ seconds. 8. Slowly return to the starting position. Repeat __________ times. Complete this exercise __________ times a day. Exercise J:Standing Shoulder Row 1. Sit in a stable chair without armrests, or stand. 2. Secure an exercise band to a stable object in front of you so it is at waist height. 3. Hold one end of the exercise band in each hand. Your palms should be in a thumbs-up position. 4. Bend each of your elbows to an "L" shape (about 90 degrees) and keep your upper arms at your sides. 5. Step back until the band is tight and there is no slack. 6. Slowly pull your elbows back behind you. 7. Hold for __________ seconds. 8. Slowly return to the starting position. Repeat __________ times. Complete this exercise __________ times a day. Exercise K:Shoulder Press-Ups  1. Sit in a stable chair that has armrests. Sit upright, with your feet flat on the floor. 2. Put your hands on the armrests so your elbows are bent and your fingers are pointing forward. Your hands should be about even with the sides of your body. 3. Push down on the armrests and use your arms to lift yourself off of the chair. Straighten your elbows and lift yourself up as much as you comfortably can. ? Move your shoulder blades down, and avoid letting your shoulders move up toward your ears. ? Keep your feet on the ground. As you get stronger, your feet should support less of your body weight as you lift yourself up. 4. Hold for __________ seconds. 5. Slowly lower yourself back into the chair. Repeat __________ times. Complete this exercise __________ times a  day. Exercise L: Wall Push-Ups  1. Stand so you are facing a stable wall. Your feet should be about one arm-length away from the wall. 2. Lean forward and place your palms on the wall at shoulder height. 3. Keep your feet flat on the floor as you bend your elbows and lean forward toward the wall. 4. Hold for __________ seconds. 5. Straighten your elbows to push yourself back to the starting position. Repeat __________ times. Complete this exercise __________ times a day. This information is not intended to replace advice   given to you by your health care provider. Make sure you discuss any questions you have with your health care provider. Document Released: 09/15/2005 Document Revised: 07/26/2016 Document Reviewed: 07/13/2015 Elsevier Interactive Patient Education  2018 [employer] Natural anti-inflammatories  You can purchase these at [employer], [employer] or online.  . Turmeric (capsules)  . Ginger (ginger root or capsules)  . Omega 3 (Fish, flax seeds, chia seeds, walnuts, almonds)  . Tart cherry (dried or extract)   Patient should be under the care of a physician while taking these supplements. This may not be reproduced without the permission of Dr. Shaili Deveshwar.  

## 2018-02-03 ENCOUNTER — Other Ambulatory Visit: Payer: Self-pay | Admitting: Endocrinology

## 2018-02-03 ENCOUNTER — Ambulatory Visit (INDEPENDENT_AMBULATORY_CARE_PROVIDER_SITE_OTHER): Payer: Medicare Other | Admitting: Endocrinology

## 2018-02-03 ENCOUNTER — Encounter: Payer: Medicare Other | Attending: Endocrinology | Admitting: Dietician

## 2018-02-03 ENCOUNTER — Encounter: Payer: Self-pay | Admitting: Endocrinology

## 2018-02-03 VITALS — BP 148/92 | HR 88 | Wt 135.8 lb

## 2018-02-03 DIAGNOSIS — E118 Type 2 diabetes mellitus with unspecified complications: Secondary | ICD-10-CM | POA: Diagnosis not present

## 2018-02-03 DIAGNOSIS — E1165 Type 2 diabetes mellitus with hyperglycemia: Secondary | ICD-10-CM | POA: Diagnosis not present

## 2018-02-03 DIAGNOSIS — Z713 Dietary counseling and surveillance: Secondary | ICD-10-CM | POA: Insufficient documentation

## 2018-02-03 LAB — HEMOGLOBIN A1C: HEMOGLOBIN A1C: 7.9 % — AB (ref 4.6–6.5)

## 2018-02-03 MED ORDER — PIOGLITAZONE HCL 45 MG PO TABS: 45.0000 mg | ORAL_TABLET | Freq: Every day | ORAL | 3 refills | Status: DC

## 2018-02-03 NOTE — Patient Instructions (Signed)
Consider using cheese toast and fruit for breakfast rather than cereal OR egg and toast and fruit  Drink more water rather than drinks with sugar  Limit splenda to no more than 23 packets per day.  Start to decrease your use without increasing your sugar.  Find ways to continue to decrease your fat intake.  Eat more vegetables.

## 2018-02-03 NOTE — Progress Notes (Signed)
Diabetes Self-Management Education  Visit Type:  Follow-up  Appt. Start Time: 1130 Appt. End Time: 1200 Patient was late.  02/03/2018  [name] [name], identified by name and date of birth, is a 72 y.o. female with a diagnosis of Diabetes:  Type 2.  Other history includes HTN and hyperlipidemia.    Patient is here today alone.  Medications include Glucotrol XL (1/2 tablet in am and 1 tablet before supper)-She is only taking an am dose and Janumet XR 50-500 2 tablets daily She is only taking this once per day. Weight is stable.  She states that her blood sugar readings are generally good.    Patient lives with her husband.  They share shopping and cooking.  He cooks very high fat and she states that she is a very picky eater.  She is now using 2/3 cup sugar for breakfast most days (1/3 cup in plain cheerios and 1/3 cup in her coffee).  Diet overall is decreased in quality nutrition.  Beverages contain sugar.  She states that she was told not to use Splenda, tried Stevia and did not like so started using high amounts of sugar.  Her husband also has diabetes and takes insulin.  Discussed that she should drink beverages without sugar and drink more water or carbohydrate free beverages and that at least Splenda will not make her blood sugar go up.  Discussed the upper limits of this as she uses a lot daily.  ASSESSMENT  Weight 135 lb (61.2 kg). Body mass index is 24.69 kg/m.   Diabetes Self-Management Education - 02/03/18 1315      Psychosocial Assessment   Patient Belief/Attitude about Diabetes  Denial    Learning Readiness  Contemplating      Pre-Education Assessment   Patient understands the diabetes disease and treatment process.  Demonstrates understanding / competency    Patient understands incorporating nutritional management into lifestyle.  Needs Review    Patient undertands incorporating physical activity into lifestyle.  Needs Review    Patient understands using  medications safely.  Demonstrates understanding / competency    Patient understands monitoring blood glucose, interpreting and using results  Demonstrates understanding / competency    Patient understands prevention, detection, and treatment of acute complications.  Demonstrates understanding / competency    Patient understands prevention, detection, and treatment of chronic complications.  Demonstrates understanding / competency    Patient understands how to develop strategies to address psychosocial issues.  Needs Review      Complications   How often do you check your blood sugar?  1-2 times/day      Dietary Intake   Breakfast  coffee with 1/3 cup sugar, plain cheerios with 1/3 cup sugar, whole milk, applesauce    Snack (morning)  cookie or chips occasionally    Lunch  skips or crackers    Snack (afternoon)  "whatever I see that looks good when I pass by"    Dinner  pintos, cornbread, fried pork chops, occasional vegetables, boiled pigs ears    Snack (evening)  activia yogurt with medications    Beverage(s)  16-24 ounces water per day, coffee with 1/3 cup sugar, sweet tea, regular soda      Patient Education   Previous Diabetes Education  Yes (please comment) last year    Nutrition management   Role of diet in the treatment of diabetes and the relationship between the three main macronutrients and blood glucose level;Meal options for control of blood glucose level and chronic  complications.    Medications  Reviewed patients medication for diabetes, action, purpose, timing of dose and side effects.    Personal strategies to promote health  Lifestyle issues that need to be addressed for better diabetes care      Individualized Goals (developed by patient)   Nutrition  General guidelines for healthy choices and portions discussed    Physical Activity  Exercise 5-7 days per week;30 minutes per day    Medications  take my medication as prescribed    Monitoring   test my blood glucose as  discussed    Reducing Risk  examine blood glucose patterns      Patient Self-Evaluation of Goals - Patient rates self as meeting previously set goals (% of time)   Nutrition  25 - 50%      Post-Education Assessment   Patient understands the diabetes disease and treatment process.  Demonstrates understanding / competency    Patient understands incorporating nutritional management into lifestyle.  Needs Review    Patient undertands incorporating physical activity into lifestyle.  Demonstrates understanding / competency    Patient understands using medications safely.  Demonstrates understanding / competency    Patient understands monitoring blood glucose, interpreting and using results  Demonstrates understanding / competency    Patient understands prevention, detection, and treatment of acute complications.  Demonstrates understanding / competency    Patient understands prevention, detection, and treatment of chronic complications.  Demonstrates understanding / competency    Patient understands how to develop strategies to address psychosocial issues.  Demonstrates understanding / competency    Patient understands how to develop strategies to promote health/change behavior.  Needs Review      Outcomes   Program Status  Not Completed      Subsequent Visit   Since your last visit have you continued or begun to take your medications as prescribed?  No    Since your last visit have you experienced any weight changes?  No change    Since your last visit, are you checking your blood glucose at least once a day?  Yes       Learning Objective:  Patient will have a greater understanding of diabetes self-management. Patient education plan is to attend individual and/or group sessions per assessed needs and concerns.   Plan:   Patient Instructions  Consider using cheese toast and fruit for breakfast rather than cereal OR egg and toast and fruit  Drink more water rather than drinks with  sugar  Limit splenda to no more than 23 packets per day.  Start to decrease your use without increasing your sugar.  Find ways to continue to decrease your fat intake.  Eat more vegetables.    Expected Outcomes:  Other (comment)(demonstrates interest in learning but question any change)  Education material provided:   If problems or questions, patient to contact team via:  Phone  Future DSME appointment: - 3-4 months

## 2018-02-03 NOTE — Patient Instructions (Addendum)
blood tests are requested for you today.  We'll let you know about the results. check your blood sugar once a day.  vary the time of day when you check, between before the 3 meals, and at bedtime.  also check if you have symptoms of your blood sugar being too high or too low.  please keep a record of the readings and bring it to your next appointment here (or you can bring the meter itself).  You can write it on any piece of paper.  please call us sooner if your blood sugar goes below 70, or if you have a lot of readings over 200. If it is high, we can add "pioglitizone." Please come back for a follow-up appointment in 3 months.

## 2018-02-03 NOTE — Progress Notes (Signed)
Subjective:    Patient ID: [name] [name], female    DOB: [birth year], 72 y.o.   MRN: [id number]  HPI Pt returns for f/u of diabetes mellitus: DM type: 2 (but lean body habitus raises possibility she is evolving type 1).   Dx'ed: 2000 Complications: gastroparesis Therapy: 3 oral meds GDM: never DKA: never Severe hypoglycemia: never Pancreatitis: never Pancreatic imaging: never Other: she has never taken insulin, but she know how (gives to husband).   Interval history: pt states she feels well in general. She takes meds as rx'ed.  she brings her meter, with her cbg's which I have reviewed today.  It varies from 160-300.   Past Medical History:  Diagnosis Date  . Bilateral knee pain    Euflexxa x3  . Chest pain 06/2007   CP and QW on EKG: neg stress test   . Diabetes mellitus    w/ neuropathy  . Diverticulitis 2000  . Hyperlipidemia   . Hypertension   . Insomnia 02/20/2013  . Osteoarthritis    gout, CPPD--- sees Dr.Davenshwar  . Osteoarthritis of both feet 09/14/2016  . Osteoarthritis of both hands 09/14/2016  . Osteoarthritis of both knees 09/14/2016  . Osteopenia    per DEXA 4/10  . Recurrent boils    h/o MRSA    Past Surgical History:  Procedure Laterality Date  . CHOLECYSTECTOMY    . TONSILLECTOMY      Social History   Socioeconomic History  . Marital status: Married    Spouse name: herbert  . Number of children: 3  . Years of education: college  . Highest education level: Not on file  Occupational History  . 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 Financial resource strain: Not on file  . Food insecurity:    Worry: Not on file    Inability: Not on file  . Transportation needs:    Medical: Not on file    Non-medical: Not on file  Tobacco Use  . Smoking status: Never Smoker  . Smokeless tobacco: Never Used  Substance and Sexual Activity    . Alcohol use: No  . Drug use: No  . Sexual activity: Not Currently  Lifestyle  . Physical activity:    Days per week: Not on file    Minutes per session: Not on file  . Stress: Not on file  Relationships  . Social connections:    Talks on phone: Not on file    Gets together: Not on file    Attends religious service: Not on file    Active member of club or organization: Not on file    Attends meetings of clubs or organizations: Not on file    Relationship status: Not on file  . Intimate partner violence:    Fear of current or ex partner: Not on file    Emotionally abused: Not on file    Physically abused: Not on file    Forced sexual activity: Not on file  Other Topics Concern  . Not on file  Social History Narrative   Lives w/ husband   Patient is married with 3 children.   Patient is right handed.   Patient has college education.   Patient drinks 3-4 daily.    Current Outpatient Medications on File Prior to Visit  Medication Sig Dispense Refill  . aspirin 81 MG tablet  Take 81 mg by mouth daily.      . Cholecalciferol (VITAMIN D) 2000 UNITS CAPS Take 2,000 Units by mouth daily. Reported on 11/18/2015    . diltiazem (CARDIZEM CD) 300 MG 24 hr capsule Take 1 capsule (300 mg total) daily by mouth. 30 capsule 0  . DULoxetine (CYMBALTA) 30 MG capsule Take 30 mg by mouth daily.    . fluticasone (FLONASE) 50 MCG/ACT nasal spray Place 2 sprays into both nostrils daily. (Patient taking differently: Place 2 sprays into both nostrils as needed. ) 16 g 6  . glipiZIDE (GLUCOTROL XL) 5 MG 24 hr tablet Take 1/2 tablet in am and 1 tablet before supper. 45 tablet 1  . glucose blood (ONETOUCH VERIO) test strip Use to test blood sugar 3 times daily 50 each 4  . JANUMET XR 50-500 MG TB24 TAKE 2 TABLETS DAILY 60 tablet 2  . Loratadine (CLARITIN PO) Take 10 mg by mouth daily as needed.     . metoCLOPramide (REGLAN) 10 MG tablet Take 10 mg by mouth 4 (four) times daily.    . Omega-3 Fatty Acids  (OMEGA 3 PO) Take 1 tablet by mouth daily. Reported on 11/18/2015    . pravastatin (PRAVACHOL) 20 MG tablet Take 1 tablet (20 mg total) by mouth at bedtime. 30 tablet 3  . TURMERIC PO Take 1 tablet by mouth daily. OTC    . Vitamin D, Ergocalciferol, (DRISDOL) 50000 units CAPS capsule Take 50,000 Units by mouth 2 (two) times a week. Takes on Tuesdays and Fridays     No current facility-administered medications on file prior to visit.     Allergies  Allergen Reactions  . Colcrys [Colchicine] Nausea Only  . Hydrocodone   . Sulindac   . Tramadol     "Made me feel like I was in another world."  . Celecoxib Rash  . Glimepiride Rash    Family History  Problem Relation Age of Onset  . Hypertension Mother   . Diabetes Mother   . Hypertension Father   . Diabetes Father   . Hypertension Daughter   . Hypertension Sister   . Diabetes Sister   . Coronary artery disease Neg Hx   . Stroke Neg Hx   . Colon cancer Neg Hx   . Breast cancer Neg Hx     BP (!) 148/92 (BP Location: Left Arm, Patient Position: Sitting, Cuff Size: Normal)   Pulse 88   Wt 135 lb 12.8 oz (61.6 kg)   SpO2 97%   BMI 24.84 kg/m    Review of Systems She denies hypoglycemia.      Objective:   Physical Exam VITAL SIGNS:  See vs page GENERAL: no distress Pulses: dorsalis pedis intact bilat.   MSK: no deformity of the feet CV: no leg edema Skin:  no ulcer on the feet.  normal color and temp on the feet. Neuro: sensation is intact to touch on the feet  Lab Results  Component Value Date   CREATININE 0.75 07/22/2017   BUN 8 07/22/2017   NA 142 07/22/2017   K 3.9 07/22/2017   CL 104 07/22/2017   CO2 27 07/22/2017    Lab Results  Component Value Date   HGBA1C 7.9 (H) 02/03/2018      Assessment & Plan:  Type 2 DM: she needs increased rx.  I have sent a prescription to your pharmacy, to add "pioglitizone." Please come back for a follow-up appointment in 3 months

## 2018-02-07 ENCOUNTER — Other Ambulatory Visit: Payer: Self-pay | Admitting: Endocrinology

## 2018-02-13 ENCOUNTER — Encounter: Payer: Self-pay | Admitting: Physician Assistant

## 2018-02-13 ENCOUNTER — Ambulatory Visit (INDEPENDENT_AMBULATORY_CARE_PROVIDER_SITE_OTHER): Payer: Self-pay

## 2018-02-13 ENCOUNTER — Ambulatory Visit (INDEPENDENT_AMBULATORY_CARE_PROVIDER_SITE_OTHER): Payer: Medicare Other | Admitting: Physician Assistant

## 2018-02-13 VITALS — BP 121/72 | HR 88 | Resp 15 | Ht 62.0 in | Wt 142.0 lb

## 2018-02-13 DIAGNOSIS — M7061 Trochanteric bursitis, right hip: Secondary | ICD-10-CM | POA: Diagnosis not present

## 2018-02-13 DIAGNOSIS — M8589 Other specified disorders of bone density and structure, multiple sites: Secondary | ICD-10-CM

## 2018-02-13 DIAGNOSIS — M19042 Primary osteoarthritis, left hand: Secondary | ICD-10-CM | POA: Diagnosis not present

## 2018-02-13 DIAGNOSIS — Z8679 Personal history of other diseases of the circulatory system: Secondary | ICD-10-CM | POA: Diagnosis not present

## 2018-02-13 DIAGNOSIS — Z8739 Personal history of other diseases of the musculoskeletal system and connective tissue: Secondary | ICD-10-CM | POA: Diagnosis not present

## 2018-02-13 DIAGNOSIS — M19071 Primary osteoarthritis, right ankle and foot: Secondary | ICD-10-CM

## 2018-02-13 DIAGNOSIS — M19041 Primary osteoarthritis, right hand: Secondary | ICD-10-CM

## 2018-02-13 DIAGNOSIS — M79605 Pain in left leg: Secondary | ICD-10-CM | POA: Diagnosis not present

## 2018-02-13 DIAGNOSIS — Z87898 Personal history of other specified conditions: Secondary | ICD-10-CM | POA: Diagnosis not present

## 2018-02-13 DIAGNOSIS — M170 Bilateral primary osteoarthritis of knee: Secondary | ICD-10-CM | POA: Diagnosis not present

## 2018-02-13 DIAGNOSIS — Z8719 Personal history of other diseases of the digestive system: Secondary | ICD-10-CM

## 2018-02-13 DIAGNOSIS — M19072 Primary osteoarthritis, left ankle and foot: Secondary | ICD-10-CM

## 2018-02-13 DIAGNOSIS — Z8639 Personal history of other endocrine, nutritional and metabolic disease: Secondary | ICD-10-CM | POA: Diagnosis not present

## 2018-02-13 DIAGNOSIS — M7062 Trochanteric bursitis, left hip: Secondary | ICD-10-CM

## 2018-02-13 NOTE — Progress Notes (Signed)
Office Visit Note  Patient: [name] [name]             Date of Birth: [birth year]           MRN: [id number]             PCP: Dorothyann Peng, MD Referring: Dorothyann Peng, MD Visit Date: 02/13/2018 Occupation: @GUAROCC @    Subjective:  Bump on left shin    History of Present Illness: [name] [name] is a 72 y.o. female with history of osteoarthritis, CPPD, and osteopenia.  Patient reports she had cortisone injections of bilateral knees on 01/10/18.  She reports that 3 days later she noticed a "bump" appear on her left shin.  She denies any warmth or effusion of her left knee.  She denies any fevers or chills.  She denies any injuries or falls.  She denies any trauma to the area.  She states the bump is not painful.  She states that the cortisone injections helped her pain significantly.  She still has some stiffness and aching in her bilateral knees intermittently. She denies any other joints being painful or swollen.     Activities of Daily Living:  Patient reports morning stiffness for 0 minutes.   Patient Denies nocturnal pain.  Difficulty dressing/grooming: Denies Difficulty climbing stairs: Denies Difficulty getting out of chair: Reports Difficulty using hands for taps, buttons, cutlery, and/or writing: Denies   Review of Systems  Constitutional: Negative for fatigue.  HENT: Negative for mouth sores, mouth dryness and nose dryness.   Eyes: Negative for pain, visual disturbance and dryness.  Respiratory: Negative for cough, hemoptysis, shortness of breath and difficulty breathing.   Cardiovascular: Negative for chest pain, palpitations, hypertension and swelling in legs/feet.  Gastrointestinal: Negative for blood in stool, constipation and diarrhea.  Endocrine: Negative for increased urination.  Genitourinary: Negative for painful urination.  Musculoskeletal: Positive for arthralgias and joint pain. Negative for joint swelling, myalgias, muscle weakness, morning stiffness,  muscle tenderness and myalgias.  Skin: Positive for nodules/bumps. Negative for color change, pallor, rash, hair loss, skin tightness, ulcers and sensitivity to sunlight.  Allergic/Immunologic: Negative for susceptible to infections.  Neurological: Negative for dizziness, numbness, headaches and weakness.  Hematological: Negative for swollen glands.  Psychiatric/Behavioral: Positive for sleep disturbance. Negative for depressed mood. The patient is not nervous/anxious.     PMFS History:  Patient Active Problem List   Diagnosis Date Noted  . 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 History of hypertension 12/08/2016  . History of diabetes mellitus 12/08/2016  . Dyslipidemia 12/08/2016  . Osteoarthritis of both hands 09/14/2016  . Osteoarthritis of both feet 09/14/2016  . Osteoarthritis of both knees 09/14/2016  . PCP NOTES >>> 08/19/2015  . Low back pain 09/09/2014  . Neuralgia of left saphenous nerve 06/05/2014  . Numbness and tingling in left arm 06/05/2014  . Paresthesia of left arm 05/21/2014  . Gastroparesis 02/20/2013  . Insomnia 02/20/2013  . Annual physical exam 03/31/2012  . History of diverticulitis 04/14/2010  . Osteopenia 05/14/2009  . DYSPHAGIA UNSPECIFIED 01/10/2009  . 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 Hyperlipidemia 06/14/2007  . Essential hypertension 06/14/2007  . Osteoarthritis 06/14/2007  . DM (diabetes mellitus) (HCC) 01/02/2007    Past Medical History:  Diagnosis Date  . Bilateral knee pain    Euflexxa x3  . Chest pain 06/2007   CP and QW on EKG: neg stress test   . Diabetes mellitus    w/ neuropathy  . Diverticulitis 2000  . Hyperlipidemia   .  Hypertension   . Insomnia 02/20/2013  . Osteoarthritis    gout, CPPD--- sees Dr.Davenshwar  . Osteoarthritis of both feet 09/14/2016  . Osteoarthritis of both hands 09/14/2016  . Osteoarthritis of both knees 09/14/2016  . Osteopenia    per DEXA 4/10  . Recurrent boils    h/o MRSA    Family History  Problem  Relation Age of Onset  . Hypertension Mother   . Diabetes Mother   . Hypertension Father   . Diabetes Father   . Hypertension Daughter   . Hypertension Sister   . Diabetes Sister   . Coronary artery disease Neg Hx   . Stroke Neg Hx   . Colon cancer Neg Hx   . Breast cancer Neg Hx    Past Surgical History:  Procedure Laterality Date  . CHOLECYSTECTOMY    . TONSILLECTOMY     Social History   Social History Narrative   Lives w/ husband   Patient is married with 3 children.   Patient is right handed.   Patient has college education.   Patient drinks 3-4 daily.     Objective: Vital Signs: BP 121/72 (BP Location: Left Arm, Patient Position: Sitting, Cuff Size: Normal)   Pulse 88   Resp 15   Ht 5\' 2"  (1.575 m)   Wt 142 lb (64.4 kg)   BMI 25.97 kg/m    Physical Exam  Constitutional: She is oriented to person, place, and time. She appears well-developed and well-nourished.  HENT:  Head: Normocephalic and atraumatic.  Eyes: Conjunctivae and EOM are normal.  Neck: Normal range of motion.  Cardiovascular: Normal rate, regular rhythm, normal heart sounds and intact distal pulses.  Pulmonary/Chest: Effort normal and breath sounds normal.  Abdominal: Soft. Bowel sounds are normal.  Lymphadenopathy:    She has no cervical adenopathy.  Neurological: She is alert and oriented to person, place, and time.  Skin: Skin is warm and dry. Capillary refill takes less than 2 seconds.  Psychiatric: She has a normal mood and affect. Her behavior is normal.  Nursing note and vitals reviewed.    Musculoskeletal Exam: C-spine, thoracic spine, lumbar spine good range of motion.  No midline spinal tenderness.  No SI joint tenderness.  Shoulder joints, elbow joints, wrist joints, MCPs, PIPs, DIPs good range of motion with no synovitis.  She has PIP and DIP synovial thickening consistent with osteoarthritis.  Hip joints, knee joints, ankle joints, MTPs, PIPs, DIPs good range of motion with no  synovitis. No warmth or effusion of bilateral knees.  She has a non-tender localized area of swelling on the anterior surface of her left tibia.     CDAI Exam: No CDAI exam completed.    Investigation: No additional findings.   Imaging: Xr Tibia/fibula Left  Result Date: 02/13/2018 Severe medial compartment narrowing with chondrocalcinosis noted. Medial intercondylar and lateral osteophytes were noted. Severe patellofemoral narrowing was noted. Area of localized dense soft tissue was noted on the anterior surface of the tibia. 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  Localized area of dense soft tissue likely a hematoma on the anterior surface of left tibia.    Speciality Comments: No specialty comments available.    Procedures:  No procedures performed Allergies: Colcrys [colchicine]; Hydrocodone; Sulindac; Tramadol; Celecoxib; and Glimepiride   Assessment / Plan:     Visit Diagnoses: Primary osteoarthritis of both knees: No warmth or effusion of bilateral knees.  She had bilateral knee cortisone injections performed on 01/10/2018.  She  has had significant relief since her cortisone injections.  She has intermittent aching and stiffness in her bilateral knees.  Per patient 3 days following the cortisone injection she noticed a localized area of swelling on the anterior aspect of her left tibia.  She denies any trauma or injuries.  On exam there is a nontender area of localized swelling with some hyperpigmentation, which is likely due to a hematoma. X-ray was obtained today, which revealed a localized area of dense soft tissue on the anterior surface of tibia.  We discussed using ice, compression, and elevation to help heal the hematoma.  She was given a handout of information about hematomas.    Pain in left leg - She has an area of localized non-tender soft tissue swelling.  X-ray revealed an area of dense soft tissue consistent with a  hematoma.  Instructions to follow about hematoma healing was provided to the patient. Plan: XR Tibia/Fibula Left, CANCELED: XR KNEE 3 VIEW LEFT   Primary osteoarthritis of both hands: She has PIP and DIP synovial thickening consistent with osteoarthritis.  Joint protection and muscle strengthening were discussed.   Primary osteoarthritis of both feet: No discomfort at this time.  History of calcium pyrophosphate deposition disease (CPPD) - Her left knee was injected on 01/10/2018.  No recent flares.    Osteopenia of multiple sites: She takes vitamin D 2,000 units daily.    Trochanteric bursitis of both hips: No tenderness on exam today.    Other medical conditions are listed as follows:   History of diverticulitis  History of insomnia  History of diabetes mellitus  History of hypertension  History of hyperlipidemia     Orders: Orders Placed This Encounter  Procedures  . XR Tibia/Fibula Left   No orders of the defined types were placed in this encounter.   Face-to-face time spent with patient was 30 minutes. >50% of time was spent in counseling and coordination of care.  Follow-Up Instructions: Return for Osteoarthritis, CPPD.   Gearldine Bienenstock, PA-C  Note - This record has been created using Dragon software.  Chart creation errors have been sought, but may not always  have been located. Such creation errors do not reflect on  the standard of medical care.

## 2018-02-13 NOTE — Patient Instructions (Signed)
Hematoma A hematoma is a collection of blood. The collection of blood can turn into a hard, painful lump under the skin. Your skin may turn blue or yellow if the hematoma is close to the surface of the skin. Most hematomas get better in a few days to weeks. Some hematomas are serious and need medical care. Hematomas can be very small or very big. Follow these instructions at home:  Apply ice to the injured area: ? Put ice in a plastic bag. ? Place a towel between your skin and the bag. ? Leave the ice on for 20 minutes, 2-3 times a day for the first 1 to 2 days.  After the first 2 days, switch to using warm packs on the injured area.  Raise (elevate) the injured area to lessen pain and puffiness (swelling). You may also wrap the area with an elastic bandage. Make sure the bandage is not wrapped too tight.  If you have a painful hematoma on your leg or foot, you may use crutches for a couple days.  Only take medicines as told by your doctor. Get help right away if:  Your pain gets worse.  Your pain is not controlled with medicine.  You have a fever.  Your puffiness gets worse.  Your skin turns more blue or yellow.  Your skin over the hematoma breaks or starts bleeding.  Your hematoma is in your chest or belly (abdomen) and you are short of breath, feel weak, or have a change in consciousness.  Your hematoma is on your scalp and you have a headache that gets worse or a change in alertness or consciousness. This information is not intended to replace advice given to you by your health care provider. Make sure you discuss any questions you have with your health care provider. Document Released: 12/09/2004 Document Revised: 04/08/2016 Document Reviewed: 04/11/2013 Elsevier Interactive Patient Education  2017 [employer]  

## 2018-03-21 ENCOUNTER — Ambulatory Visit: Payer: Medicare Other | Admitting: Rheumatology

## 2018-05-09 ENCOUNTER — Ambulatory Visit: Payer: Medicare Other | Admitting: Endocrinology

## 2018-05-09 ENCOUNTER — Other Ambulatory Visit: Payer: Self-pay | Admitting: Internal Medicine

## 2018-05-09 DIAGNOSIS — E2839 Other primary ovarian failure: Secondary | ICD-10-CM

## 2018-05-09 DIAGNOSIS — Z1231 Encounter for screening mammogram for malignant neoplasm of breast: Secondary | ICD-10-CM

## 2018-05-09 DIAGNOSIS — Z0289 Encounter for other administrative examinations: Secondary | ICD-10-CM

## 2018-05-14 IMAGING — US US EXTREM LOW VENOUS BILAT
1 series · 13 of 24 positions shown · non-contrast
Comparison: None.

CLINICAL DATA: Bilateral lower extremity pain and edema. Evaluate
for DVT.



[Series 1: us extrem low venous bilat · 0.08mm/px · 13 of 99 slices shown]
[im 1/99]
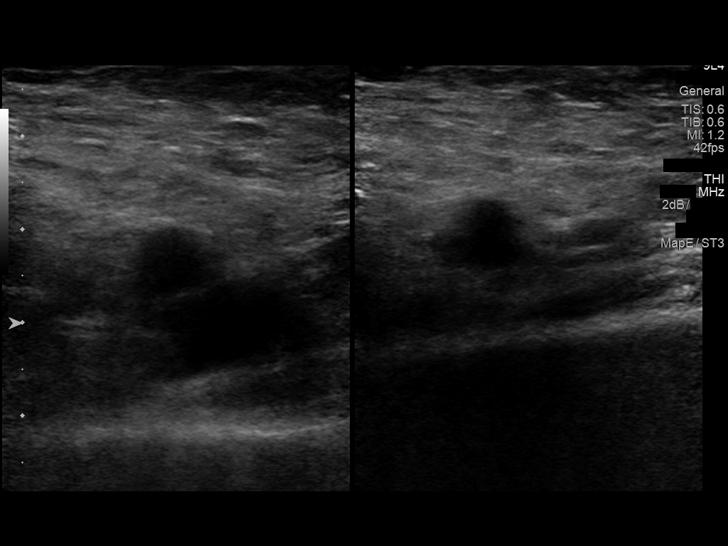
[im 9/99]
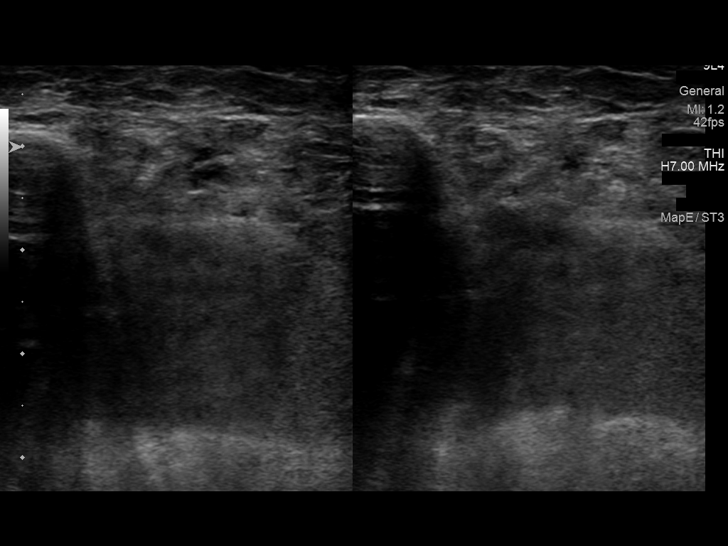
[im 18/99]
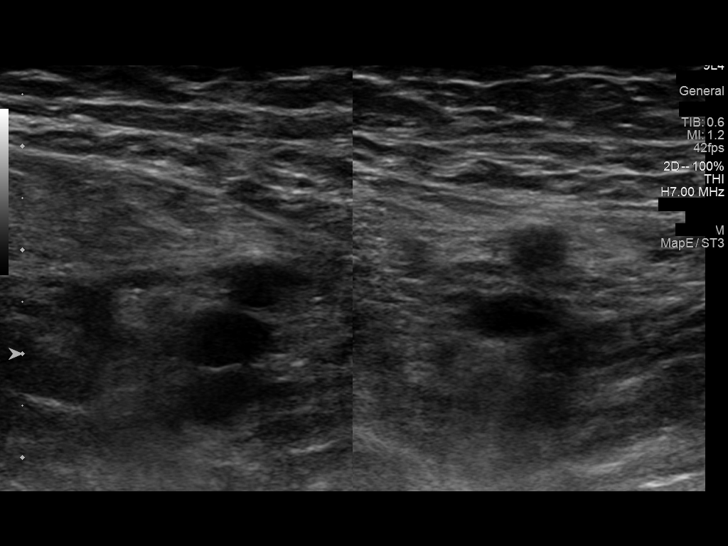
[im 26/99]
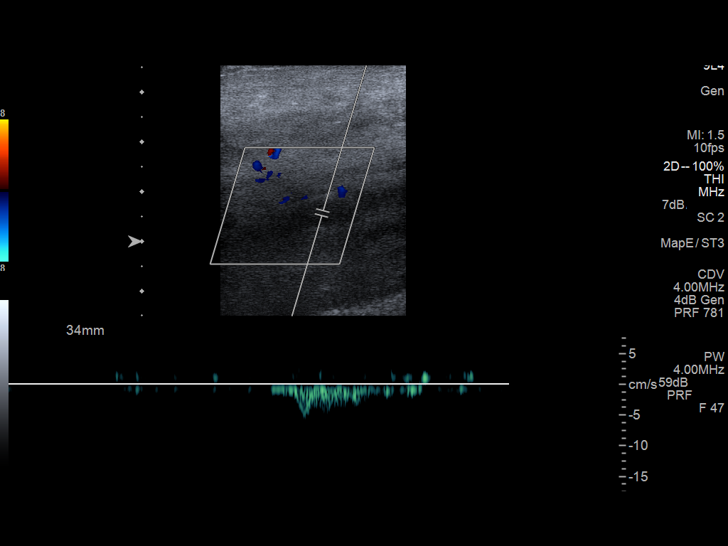
[im 35/99]
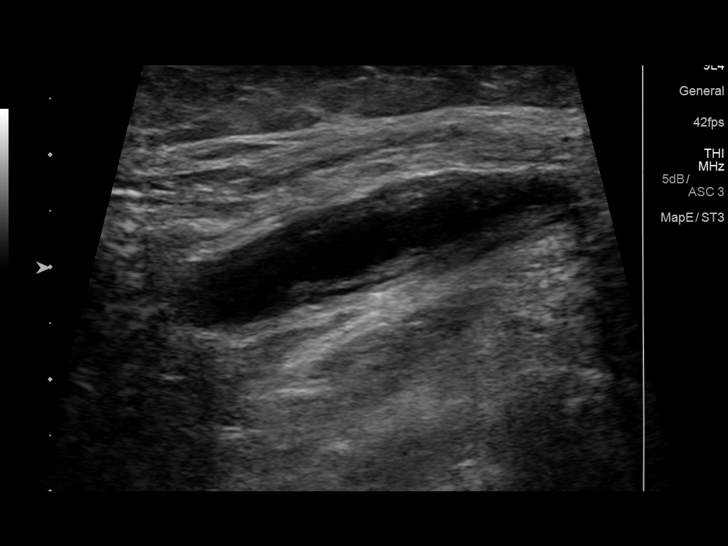
[im 43/99]
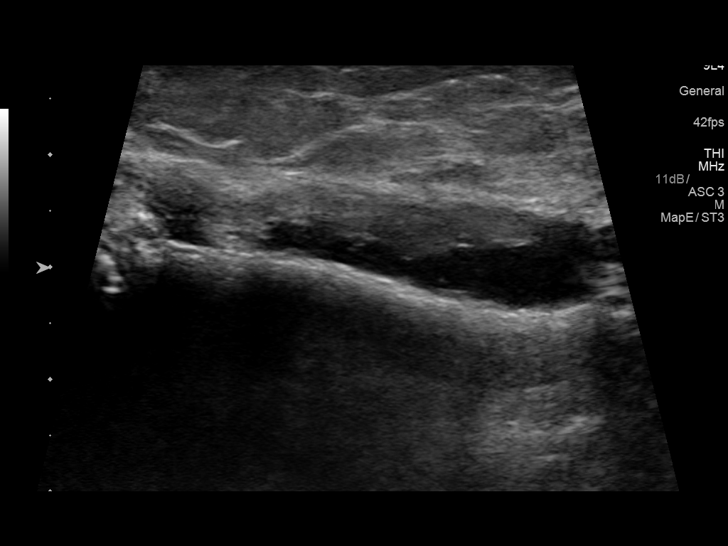
[im 52/99]
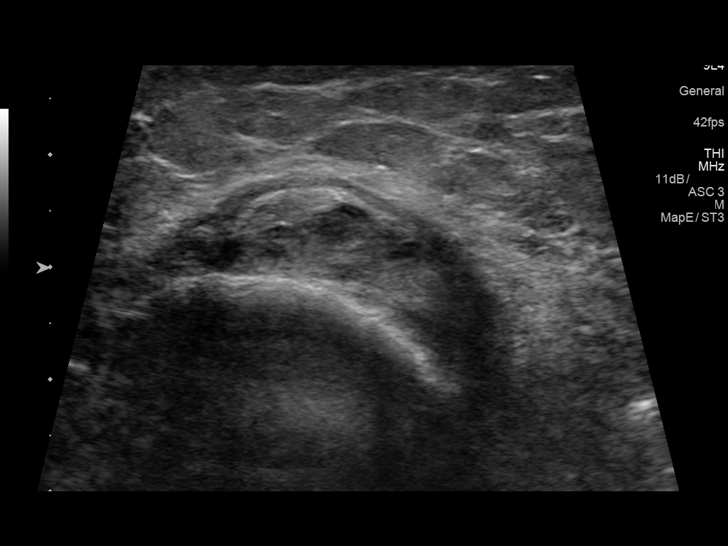
[im 56/99]
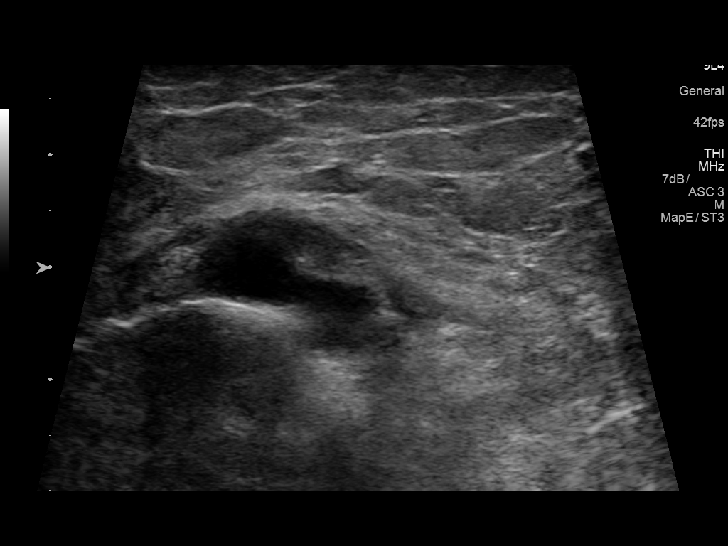
[im 64/99]
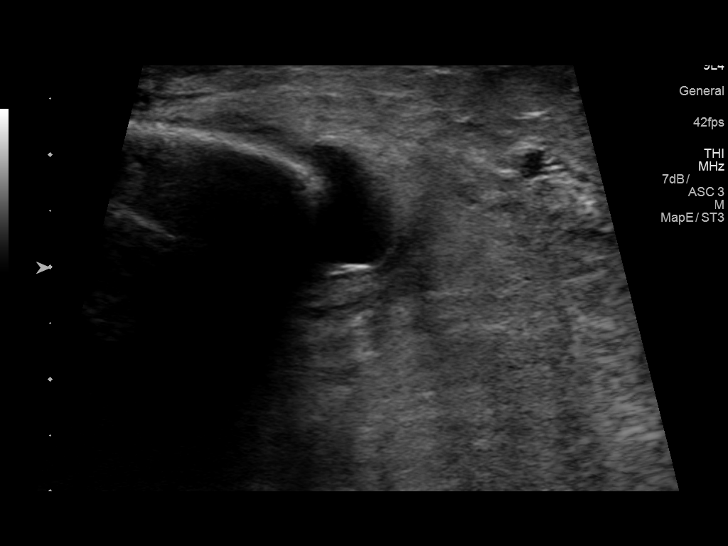
[im 73/99]
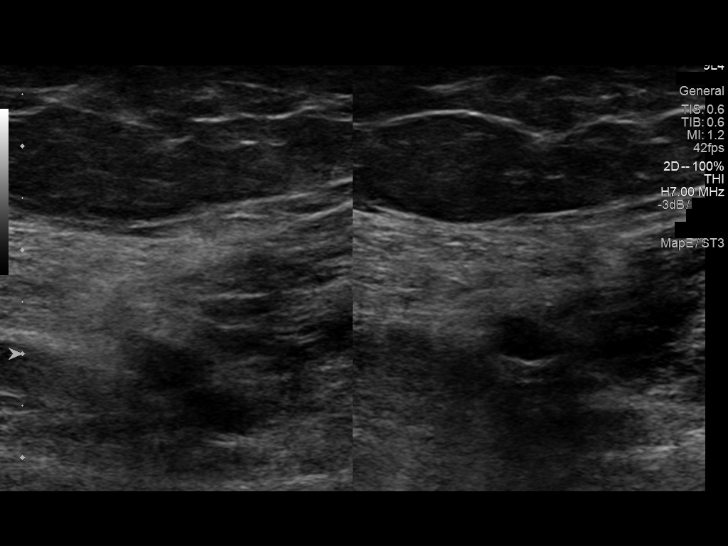
[im 81/99]
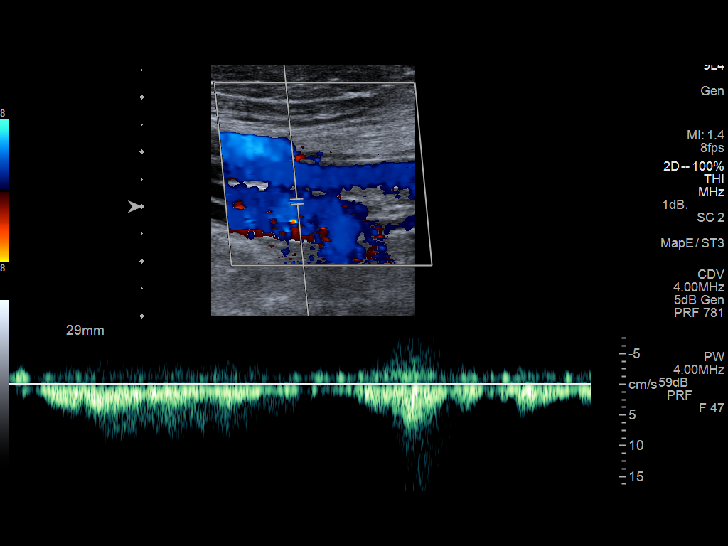
[im 90/99]
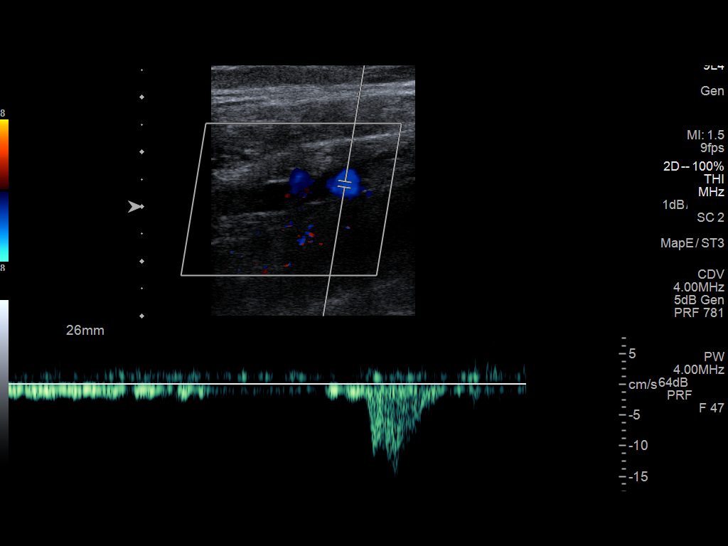
[im 99/99]
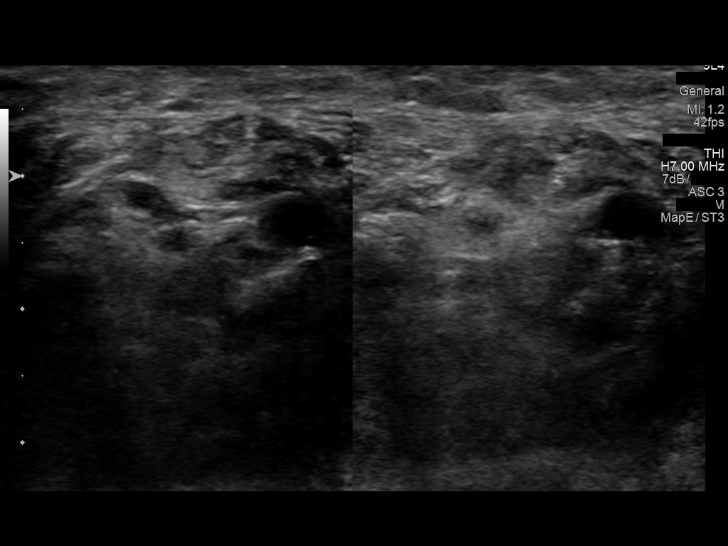

[13 of 24 positions shown; findings below may reference images not displayed]

FINDINGS: RIGHT LOWER EXTREMITY

Common Femoral Vein: No evidence of thrombus. Normal
compressibility, respiratory phasicity and response to augmentation.

Saphenofemoral Junction: No evidence of thrombus. Normal
compressibility and flow on color Doppler imaging.

Profunda Femoral Vein: No evidence of thrombus. Normal
compressibility and flow on color Doppler imaging.

Femoral Vein: No evidence of thrombus. Normal compressibility,
respiratory phasicity and response to augmentation.

Popliteal Vein: No evidence of thrombus. Normal compressibility,
respiratory phasicity and response to augmentation.

Calf Veins: No evidence of thrombus. Normal compressibility and flow
on color Doppler imaging.

Superficial Great Saphenous Vein: No evidence of thrombus. Normal
compressibility.

Venous Reflux:  None.

Other Findings: There is an approximately 6.9 x 2.3 x 1.0 cm
minimally complex fluid collection extending from the right
popliteal fossa which is favored to represent a minimally complex
Baker cyst.

LEFT LOWER EXTREMITY

Common Femoral Vein: No evidence of thrombus. Normal
compressibility, respiratory phasicity and response to augmentation.

Saphenofemoral Junction: No evidence of thrombus. Normal
compressibility and flow on color Doppler imaging.

Profunda Femoral Vein: No evidence of thrombus. Normal
compressibility and flow on color Doppler imaging.

Femoral Vein: No evidence of thrombus. Normal compressibility,
respiratory phasicity and response to augmentation.

Popliteal Vein: No evidence of thrombus. Normal compressibility,
respiratory phasicity and response to augmentation.

Calf Veins: No evidence of thrombus. Normal compressibility and flow
on color Doppler imaging.

Superficial Great Saphenous Vein: No evidence of thrombus. Normal
compressibility.

Venous Reflux:  None.

Other Findings:  None.
IMPRESSION: 1. No evidence DVT within either lower extremity.
2. Complex approximately 6.9 cm suspected right-sided Baker cyst.

## 2018-06-02 ENCOUNTER — Ambulatory Visit: Payer: Medicare Other | Admitting: Dietician

## 2018-06-10 ENCOUNTER — Other Ambulatory Visit: Payer: Self-pay | Admitting: Endocrinology

## 2018-06-10 ENCOUNTER — Other Ambulatory Visit: Payer: Self-pay | Admitting: Internal Medicine

## 2018-06-16 ENCOUNTER — Ambulatory Visit: Payer: Medicare Other | Admitting: Dietician

## 2018-06-27 NOTE — Progress Notes (Deleted)
Office Visit Note  Patient: [name] [name]             Date of Birth: 09/26/1946           MRN: [id number]             PCP: Dorothyann PengSanders, Robyn, MD Referring: Dorothyann PengSanders, Robyn, MD Visit Date: 07/11/2018 Occupation: @GUAROCC @  Subjective:  No chief complaint on file.   History of Present Illness: [name] [name] is a 72 y.o. female ***   Activities of Daily Living:  Patient reports morning stiffness for *** {minute/hour:19697}.   Patient {ACTIONS;DENIES/REPORTS:21021675::"Denies"} nocturnal pain.  Difficulty dressing/grooming: {ACTIONS;DENIES/REPORTS:21021675::"Denies"} Difficulty climbing stairs: {ACTIONS;DENIES/REPORTS:21021675::"Denies"} Difficulty getting out of chair: {ACTIONS;DENIES/REPORTS:21021675::"Denies"} Difficulty using hands for taps, buttons, cutlery, and/or writing: {ACTIONS;DENIES/REPORTS:21021675::"Denies"}  No Rheumatology ROS completed.   PMFS History:  Patient Active Problem List   Diagnosis Date Noted  . 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 History of hypertension 12/08/2016  . History of diabetes mellitus 12/08/2016  . Dyslipidemia 12/08/2016  . Osteoarthritis of both hands 09/14/2016  . Osteoarthritis of both feet 09/14/2016  . Osteoarthritis of both knees 09/14/2016  . PCP NOTES >>> 08/19/2015  . Low back pain 09/09/2014  . Neuralgia of left saphenous nerve 06/05/2014  . Numbness and tingling in left arm 06/05/2014  . Paresthesia of left arm 05/21/2014  . Gastroparesis 02/20/2013  . Insomnia 02/20/2013  . Annual physical exam 03/31/2012  . History of diverticulitis 04/14/2010  . Osteopenia 05/14/2009  . DYSPHAGIA UNSPECIFIED 01/10/2009  . 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 Hyperlipidemia 06/14/2007  . Essential hypertension 06/14/2007  . Osteoarthritis 06/14/2007  . DM (diabetes mellitus) (HCC) 01/02/2007    Past Medical History:  Diagnosis Date  . Bilateral knee pain    Euflexxa x3  . Chest pain 06/2007   CP and QW on EKG: neg stress test   .  Diabetes mellitus    w/ neuropathy  . Diverticulitis 2000  . Hyperlipidemia   . Hypertension   . Insomnia 02/20/2013  . Osteoarthritis    gout, CPPD--- sees Dr.Davenshwar  . Osteoarthritis of both feet 09/14/2016  . Osteoarthritis of both hands 09/14/2016  . Osteoarthritis of both knees 09/14/2016  . Osteopenia    per DEXA 4/10  . Recurrent boils    h/o MRSA    Family History  Problem Relation Age of Onset  . Hypertension Mother   . Diabetes Mother   . Hypertension Father   . Diabetes Father   . Hypertension Daughter   . Hypertension Sister   . Diabetes Sister   . Coronary artery disease Neg Hx   . Stroke Neg Hx   . Colon cancer Neg Hx   . Breast cancer Neg Hx    Past Surgical History:  Procedure Laterality Date  . CHOLECYSTECTOMY    . TONSILLECTOMY     Social History   Social History Narrative   Lives w/ husband   Patient is married with 3 children.   Patient is right handed.   Patient has college education.   Patient drinks 3-4 daily.    Objective: Vital Signs: There were no vitals taken for this visit.   Physical Exam   Musculoskeletal Exam: ***  CDAI Exam: CDAI Score: Not documented Patient Global Assessment: Not documented; Provider Global Assessment: Not documented Swollen: Not documented; Tender: Not documented Joint Exam   Not documented   There is currently no information documented on the homunculus. Go to the Rheumatology activity and complete the homunculus joint exam.  Investigation: No additional findings.  Imaging: No results found.  Recent Labs: Lab Results  Component Value Date   WBC 8.3 02/08/2017   HGB 13.2 02/08/2017   PLT 290.0 02/08/2017   NA 142 07/22/2017   K 3.9 07/22/2017   CL 104 07/22/2017   CO2 27 07/22/2017   GLUCOSE 122 (H) 07/22/2017   BUN 8 07/22/2017   CREATININE 0.75 07/22/2017   BILITOT 0.4 02/08/2017   ALKPHOS 74 02/08/2017   AST 13 02/08/2017   ALT 23 02/08/2017   PROT 7.3 02/08/2017   ALBUMIN  4.4 02/08/2017   CALCIUM 10.3 07/22/2017   GFRAA >89 12/09/2016    Speciality Comments: No specialty comments available.  Procedures:  No procedures performed Allergies: Colcrys [colchicine]; Hydrocodone; Sulindac; Tramadol; Celecoxib; and Glimepiride   Assessment / Plan:     Visit Diagnoses: No diagnosis found.   Orders: No orders of the defined types were placed in this encounter.  No orders of the defined types were placed in this encounter.   Face-to-face time spent with patient was *** minutes. Greater than 50% of time was spent in counseling and coordination of care.  Follow-Up Instructions: No follow-ups on file.   Gearldine Bienenstockaylor M Jodi Criscuolo, PA-C  Note - This record has been created using Dragon software.  Chart creation errors have been sought, but may not always  have been located. Such creation errors do not reflect on  the standard of medical care.

## 2018-07-11 ENCOUNTER — Ambulatory Visit: Payer: Medicare Other | Admitting: Physician Assistant

## 2018-07-31 DIAGNOSIS — M109 Gout, unspecified: Secondary | ICD-10-CM | POA: Diagnosis not present

## 2018-07-31 DIAGNOSIS — E1165 Type 2 diabetes mellitus with hyperglycemia: Secondary | ICD-10-CM

## 2018-09-12 ENCOUNTER — Other Ambulatory Visit: Payer: Self-pay | Admitting: Endocrinology

## 2018-09-12 ENCOUNTER — Telehealth: Payer: Self-pay | Admitting: Endocrinology

## 2018-09-12 NOTE — Telephone Encounter (Signed)
Patient requests RX for [facility] VERIO test strips with refills sent to [facility] on [address] Asap. Patient is out of test strips.

## 2018-09-12 NOTE — Telephone Encounter (Signed)
I will be sending a Rx for 50 strips to her pharmacy with 0 refills. Pt last seen 01/2018 and was advised to schedule f/u appt in 5mo. Please call to schedule appt. Thanks

## 2018-09-13 NOTE — Telephone Encounter (Signed)
error 

## 2018-09-14 ENCOUNTER — Ambulatory Visit (INDEPENDENT_AMBULATORY_CARE_PROVIDER_SITE_OTHER): Payer: Medicare Other

## 2018-09-14 ENCOUNTER — Ambulatory Visit (INDEPENDENT_AMBULATORY_CARE_PROVIDER_SITE_OTHER): Payer: Medicare Other | Admitting: Internal Medicine

## 2018-09-14 ENCOUNTER — Encounter: Payer: Self-pay | Admitting: Internal Medicine

## 2018-09-14 VITALS — BP 142/78 | HR 82 | Temp 97.9°F | Ht 61.0 in | Wt 142.4 lb

## 2018-09-14 DIAGNOSIS — I10 Essential (primary) hypertension: Secondary | ICD-10-CM | POA: Diagnosis not present

## 2018-09-14 DIAGNOSIS — Z1239 Encounter for other screening for malignant neoplasm of breast: Secondary | ICD-10-CM | POA: Diagnosis not present

## 2018-09-14 DIAGNOSIS — E785 Hyperlipidemia, unspecified: Secondary | ICD-10-CM | POA: Diagnosis not present

## 2018-09-14 DIAGNOSIS — E119 Type 2 diabetes mellitus without complications: Secondary | ICD-10-CM | POA: Diagnosis not present

## 2018-09-14 DIAGNOSIS — Z0000 Encounter for general adult medical examination without abnormal findings: Secondary | ICD-10-CM

## 2018-09-14 DIAGNOSIS — R9431 Abnormal electrocardiogram [ECG] [EKG]: Secondary | ICD-10-CM | POA: Diagnosis not present

## 2018-09-14 DIAGNOSIS — Z23 Encounter for immunization: Secondary | ICD-10-CM | POA: Diagnosis not present

## 2018-09-14 DIAGNOSIS — Z0001 Encounter for general adult medical examination with abnormal findings: Secondary | ICD-10-CM

## 2018-09-14 LAB — POCT UA - MICROALBUMIN
Albumin/Creatinine Ratio, Urine, POC: 30
Creatinine, POC: 300 mg/dL
Microalbumin Ur, POC: 30 mg/L

## 2018-09-14 LAB — POCT URINALYSIS DIPSTICK
Bilirubin, UA: NEGATIVE
Glucose, UA: NEGATIVE
KETONES UA: NEGATIVE
NITRITE UA: NEGATIVE
Protein, UA: NEGATIVE
RBC UA: NEGATIVE
SPEC GRAV UA: 1.025 (ref 1.010–1.025)
Urobilinogen, UA: 0.2 E.U./dL
pH, UA: 5 (ref 5.0–8.0)

## 2018-09-14 NOTE — Patient Instructions (Addendum)
[name] [name] , Thank you for taking time to come for your Medicare Wellness Visit. I appreciate your ongoing commitment to your health goals. Please review the following plan we discussed and let me know if I can assist you in the future.   Screening recommendations/referrals: Colonoscopy: up to date Mammogram: referral needed Bone Density: up to date Recommended yearly ophthalmology/optometry visit for glaucoma screening and checkup Recommended yearly dental visit for hygiene and checkup  Vaccinations: Influenza vaccine: today Pneumococcal vaccine: 01/2014 Tdap vaccine: 07/2010 Shingles vaccine: decline    Advanced directives: Please bring a copy of your POA (Power of [place]) and/or Living Will to your next appointment.    Conditions/risks identified: Patient does not exercise regularly.   Next appointment: 02/14/2019 at 8:30a   Preventive Care 65 Years and Older, Female Preventive care refers to lifestyle choices and visits with your health care provider that can promote health and wellness. What does preventive care include?  A yearly physical exam. This is also called an annual well check.  Dental exams once or twice a year.  Routine eye exams. Ask your health care provider how often you should have your eyes checked.  Personal lifestyle choices, including:  Daily care of your teeth and gums.  Regular physical activity.  Eating a healthy diet.  Avoiding tobacco and drug use.  Limiting alcohol use.  Practicing safe sex.  Taking low-dose aspirin every day.  Taking vitamin and mineral supplements as recommended by your health care provider. What happens during an annual well check? The services and screenings done by your health care provider during your annual well check will depend on your age, overall health, lifestyle risk factors, and family history of disease. Counseling  Your health care provider may ask you questions about your:  Alcohol use.  Tobacco  use.  Drug use.  Emotional well-being.  Home and relationship well-being.  Sexual activity.  Eating habits.  History of falls.  Memory and ability to understand (cognition).  Work and work Astronomer.  Reproductive health. Screening  You may have the following tests or measurements:  Height, weight, and BMI.  Blood pressure.  Lipid and cholesterol levels. These may be checked every 5 years, or more frequently if you are over 71 years old.  Skin check.  Lung cancer screening. You may have this screening every year starting at age 60 if you have a 30-pack-year history of smoking and currently smoke or have quit within the past 15 years.  Fecal occult blood test (FOBT) of the stool. You may have this test every year starting at age 53.  Flexible sigmoidoscopy or colonoscopy. You may have a sigmoidoscopy every 5 years or a colonoscopy every 10 years starting at age 89.  Hepatitis C blood test.  Hepatitis B blood test.  Sexually transmitted disease (STD) testing.  Diabetes screening. This is done by checking your blood sugar (glucose) after you have not eaten for a while (fasting). You may have this done every 1-3 years.  Bone density scan. This is done to screen for osteoporosis. You may have this done starting at age 64.  Mammogram. This may be done every 1-2 years. Talk to your health care provider about how often you should have regular mammograms. Talk with your health care provider about your test results, treatment options, and if necessary, the need for more tests. Vaccines  Your health care provider may recommend certain vaccines, such as:  Influenza vaccine. This is recommended every year.  Tetanus, diphtheria, and acellular  pertussis (Tdap, Td) vaccine. You may need a Td booster every 10 years.  Zoster vaccine. You may need this after age 19.  Pneumococcal 13-valent conjugate (PCV13) vaccine. One dose is recommended after age 42.  Pneumococcal  polysaccharide (PPSV23) vaccine. One dose is recommended after age 45. Talk to your health care provider about which screenings and vaccines you need and how often you need them. This information is not intended to replace advice given to you by your health care provider. Make sure you discuss any questions you have with your health care provider. Document Released: 11/28/2015 Document Revised: 07/21/2016 Document Reviewed: 09/02/2015 Elsevier Interactive Patient Education  2017 [place] Prevention in the Home Falls can cause injuries. They can happen to people of all ages. There are many things you can do to make your home safe and to help prevent falls. What can I do on the outside of my home?  Regularly fix the edges of walkways and driveways and fix any cracks.  Remove anything that might make you trip as you walk through a door, such as a raised step or threshold.  Trim any bushes or trees on the path to your home.  Use bright outdoor lighting.  Clear any walking paths of anything that might make someone trip, such as rocks or tools.  Regularly check to see if handrails are loose or broken. Make sure that both sides of any steps have handrails.  Any raised decks and porches should have guardrails on the edges.  Have any leaves, snow, or ice cleared regularly.  Use sand or salt on walking paths during winter.  Clean up any spills in your garage right away. This includes oil or grease spills. What can I do in the bathroom?  Use night lights.  Install grab bars by the toilet and in the tub and shower. Do not use towel bars as grab bars.  Use non-skid mats or decals in the tub or shower.  If you need to sit down in the shower, use a plastic, non-slip stool.  Keep the floor dry. Clean up any water that spills on the floor as soon as it happens.  Remove soap buildup in the tub or shower regularly.  Attach bath mats securely with double-sided non-slip rug  tape.  Do not have throw rugs and other things on the floor that can make you trip. What can I do in the bedroom?  Use night lights.  Make sure that you have a light by your bed that is easy to reach.  Do not use any sheets or blankets that are too big for your bed. They should not hang down onto the floor.  Have a firm chair that has side arms. You can use this for support while you get dressed.  Do not have throw rugs and other things on the floor that can make you trip. What can I do in the kitchen?  Clean up any spills right away.  Avoid walking on wet floors.  Keep items that you use a lot in easy-to-reach places.  If you need to reach something above you, use a strong step stool that has a grab bar.  Keep electrical cords out of the way.  Do not use floor polish or wax that makes floors slippery. If you must use wax, use non-skid floor wax.  Do not have throw rugs and other things on the floor that can make you trip. What can I do with my stairs?  Do not leave any items on the stairs.  Make sure that there are handrails on both sides of the stairs and use them. Fix handrails that are broken or loose. Make sure that handrails are as long as the stairways.  Check any carpeting to make sure that it is firmly attached to the stairs. Fix any carpet that is loose or worn.  Avoid having throw rugs at the top or bottom of the stairs. If you do have throw rugs, attach them to the floor with carpet tape.  Make sure that you have a light switch at the top of the stairs and the bottom of the stairs. If you do not have them, ask someone to add them for you. What else can I do to help prevent falls?  Wear shoes that:  Do not have high heels.  Have rubber bottoms.  Are comfortable and fit you well.  Are closed at the toe. Do not wear sandals.  If you use a stepladder:  Make sure that it is fully opened. Do not climb a closed stepladder.  Make sure that both sides of the  stepladder are locked into place.  Ask someone to hold it for you, if possible.  Clearly mark and make sure that you can see:  Any grab bars or handrails.  First and last steps.  Where the edge of each step is.  Use tools that help you move around (mobility aids) if they are needed. These include:  Canes.  Walkers.  Scooters.  Crutches.  Turn on the lights when you go into a dark area. Replace any light bulbs as soon as they burn out.  Set up your furniture so you have a clear path. Avoid moving your furniture around.  If any of your floors are uneven, fix them.  If there are any pets around you, be aware of where they are.  Review your medicines with your doctor. Some medicines can make you feel dizzy. This can increase your chance of falling. Ask your doctor what other things that you can do to help prevent falls. This information is not intended to replace advice given to you by your health care provider. Make sure you discuss any questions you have with your health care provider. Document Released: 08/28/2009 Document Revised: 04/08/2016 Document Reviewed: 12/06/2014 Elsevier Interactive Patient Education  2017 [employer].

## 2018-09-14 NOTE — Progress Notes (Addendum)
Subjective:     Patient ID: [name] [name] , female    DOB: 1946-03-03 , 72 y.o.   MRN: [id number]   CC- I need a physical  HPI Here for Physical. Does not have any complaints. Last colonoscopy 2017. Cant recall last dexa or mammogram she had done.   Past Medical History:  Diagnosis Date  . Bilateral knee pain    Euflexxa x3  . Chest pain 06/2007   CP and QW on EKG: neg stress test   . Diabetes mellitus    w/ neuropathy  . Diverticulitis 2000  . Hyperlipidemia   . Hypertension   . Insomnia 02/20/2013  . Osteoarthritis    gout, CPPD--- sees Dr.Davenshwar  . Osteoarthritis of both feet 09/14/2016  . Osteoarthritis of both hands 09/14/2016  . Osteoarthritis of both knees 09/14/2016  . Osteopenia    per DEXA 4/10  . Recurrent boils    h/o MRSA     Family History  Problem Relation Age of Onset  . Hypertension Mother   . Diabetes Mother   . Hypertension Father   . Diabetes Father   . Hypertension Daughter   . Hypertension Sister   . Diabetes Sister   . Coronary artery disease Neg Hx   . Stroke Neg Hx   . Colon cancer Neg Hx   . Breast cancer Neg Hx      Current Outpatient Medications:  .  aspirin 81 MG tablet, Take 81 mg by mouth daily.  , Disp: , Rfl:  .  Cholecalciferol (VITAMIN D) 2000 UNITS CAPS, Take 2,000 Units by mouth daily. Reported on 11/18/2015, Disp: , Rfl:  .  diltiazem (CARDIZEM CD) 300 MG 24 hr capsule, Take 1 capsule (300 mg total) daily by mouth., Disp: 30 capsule, Rfl: 0 .  diltiazem (TIAZAC) 300 MG 24 hr capsule, Take 300 mg by mouth daily., Disp: , Rfl: 3 .  DULoxetine (CYMBALTA) 30 MG capsule, Take 30 mg by mouth daily., Disp: , Rfl:  .  fluticasone (FLONASE) 50 MCG/ACT nasal spray, Place 2 sprays into both nostrils daily. (Patient not taking: Reported on 09/14/2018), Disp: 16 g, Rfl: 6 .  glipiZIDE (GLUCOTROL XL) 5 MG 24 hr tablet, Take 1/2 tablet in am and 1 tablet before supper. (Patient not taking: Reported on 09/14/2018), Disp: 45 tablet, Rfl:  1 .  glucose blood (ONETOUCH VERIO) test strip, USE TO TEST BLOOD SUGAR 3 TIMES DAILY, Disp: 50 each, Rfl: 0 .  JANUMET XR 50-500 MG TB24, TAKE 2 TABLETS DAILY, Disp: 60 tablet, Rfl: 2 .  Loratadine (CLARITIN PO), Take 10 mg by mouth daily as needed. , Disp: , Rfl:  .  metoCLOPramide (REGLAN) 10 MG tablet, Take 10 mg by mouth 4 (four) times daily., Disp: , Rfl:  .  Omega-3 Fatty Acids (OMEGA 3 PO), Take 1 tablet by mouth daily. Reported on 11/18/2015, Disp: , Rfl:  .  pioglitazone (ACTOS) 45 MG tablet, Take 1 tablet (45 mg total) by mouth daily., Disp: 90 tablet, Rfl: 3 .  pravastatin (PRAVACHOL) 20 MG tablet, Take 1 tablet (20 mg total) by mouth at bedtime., Disp: 30 tablet, Rfl: 3 .  TURMERIC PO, Take 1 tablet by mouth daily. OTC, Disp: , Rfl:  .  Vitamin D, Ergocalciferol, (DRISDOL) 50000 units CAPS capsule, Take 50,000 Units by mouth 2 (two) times a week. Takes on Tuesdays and Fridays, Disp: , Rfl:    Allergies  Allergen Reactions  . Colcrys [Colchicine] Nausea Only  . Hydrocodone   .  Sulindac   . Tramadol     "Made me feel like I was in another world."  . Celecoxib Rash  . Glimepiride Rash     Review of Systems  Eyes: Negative.   Respiratory: Negative for cough, choking and shortness of breath.   Cardiovascular: Negative for chest pain, palpitations and leg swelling.  Gastrointestinal: Negative.   Endocrine: Negative for cold intolerance, heat intolerance, polydipsia and polyphagia.  Genitourinary: Negative.   Musculoskeletal: Positive for arthralgias, back pain and joint swelling. Negative for gait problem, myalgias, neck pain and neck stiffness.       Fingers and knee swelling from OA  Skin: Negative.   Allergic/Immunologic: Negative for environmental allergies and food allergies.  Neurological: Negative for dizziness, facial asymmetry, speech difficulty, weakness, light-headedness, numbness and headaches.  Hematological: Negative.   Psychiatric/Behavioral: Negative.       Today's Vitals   09/14/18 1108  BP: (!) 142/78  Pulse: 82  Temp: 97.9 F (36.6 C)  TempSrc: Oral  Weight: 142 lb 6.4 oz (64.6 kg)  Height: 5\' 1"  (1.549 m)   Body mass index is 26.91 kg/m.   Objective:  Physical Exam   Constitutional: She is oriented to person, place, and time. She appears well-developed and well-nourished. No distress.  HENT:  Head: Normocephalic and atraumatic.  Right Ear: External ear normal.  Left Ear: External ear normal.  Nose: Nose normal.  Eyes: Conjunctivae are normal. Right eye exhibits no discharge. Left eye exhibits no discharge. No scleral icterus.  Neck: Neck supple. No thyromegaly present.  No carotid bruits bilaterally  Cardiovascular: Normal rate and regular rhythm.  No murmur heard. Pulmonary/Chest: Effort normal and breath sounds normal. No respiratory distress.  Breast- both nipples are inverted which is her normal, no masses, axillary nodes, nipple discharge or deviation bilaterally. Musculoskeletal: Normal range of motion. She exhibits no edema.  Lymphadenopathy:    She has no cervical adenopathy.  Neurological: She is alert and oriented to person, place, and time.  Skin: Skin is warm and dry. Capillary refill takes less than 2 seconds. No rash noted. She is not diaphoretic.  Psychiatric: She has a normal mood and affect. Her behavior is normal. Judgment and thought content normal.  Nursing note reviewed.    Assessment And Plan:  1. Essential hypertension- chronic, with slight systolic elevation - EKG 12-Lead-chronic with mild elevation of systolic - EKG 12-Lead- shows non specific T abnormalities which is a change from prior EKG from 2018 which was normal then. I made a referral She was seen for Medicare Wellness by LPN today  2- Abnormal EKG- new. Referred to see Cardiologist. - CMP14 + Anion Gap - CBC no Diff - Ambulatory referral to Cardiology  2. Routine general medical examination at a health care facility- routine. FU 1  y  3. Hyperlipidemia, unspecified hyperlipidemia type- chronic - TSH - T4, Free - T3, free - Lipid Profile  4. 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 Abnormal EKG- new - Ambulatory referral to Cardiology 6- Immunization need- High dose flu shot given.  May continue same meds, FU with PCP in 3 months.  Josecarlos Harriott RODRIGUEZ-SOUTHWORTH, PA-C

## 2018-09-14 NOTE — Progress Notes (Signed)
Subjective:   [name] [name] is a 72 y.o. female who presents for Medicare Annual (Subsequent) preventive examination.  Review of Systems:  n/a Cardiac Risk Factors include: diabetes mellitus;hypertension;advanced age (>1men, >51 women);sedentary lifestyle     Objective:     Vitals: BP (!) 142/78 (BP Location: Left Arm)   Pulse 82   Temp 97.9 F (36.6 C) (Oral)   Ht 5\' 1"  (1.549 m)   Wt 142 lb 6.4 oz (64.6 kg)   SpO2 97%   BMI 26.91 kg/m   Body mass index is 26.91 kg/m.  Advanced Directives 09/14/2018 07/22/2017 06/15/2016  Does Patient Have a Medical Advance Directive? Yes No No  Type of Advance Directive Living will - -  Does patient want to make changes to medical advance directive? No - Patient declined - -  Would patient like information on creating a medical advance directive? - Yes (MAU/Ambulatory/Procedural Areas - Information given) Yes - Educational materials given    Tobacco Social History   Tobacco Use  Smoking Status Never Smoker  Smokeless Tobacco Never Used     Counseling given: Not Answered   Clinical Intake:  Pre-visit preparation completed: Yes  Pain : No/denies pain Pain Score: 0-No pain     Nutritional Status: BMI 25 -29 Overweight Nutritional Risks: None Diabetes: Yes CBG done?: No Did pt. bring in CBG monitor from home?: No  How often do you need to have someone help you when you read instructions, pamphlets, or other written materials from your doctor or pharmacy?: 1 - Never What is the last grade level you completed in school?: 12TH GRADE   Interpreter Needed?: No  Information entered by :: NAllen LPN  Past Medical History:  Diagnosis Date  . Bilateral knee pain    Euflexxa x3  . Chest pain 06/2007   CP and QW on EKG: neg stress test   . Diabetes mellitus    w/ neuropathy  . Diverticulitis 2000  . Hyperlipidemia   . Hypertension   . Insomnia 02/20/2013  . Osteoarthritis    gout, CPPD--- sees Dr.Davenshwar  .  Osteoarthritis of both feet 09/14/2016  . Osteoarthritis of both hands 09/14/2016  . Osteoarthritis of both knees 09/14/2016  . Osteopenia    per DEXA 4/10  . Recurrent boils    h/o MRSA   Past Surgical History:  Procedure Laterality Date  . CHOLECYSTECTOMY    . TONSILLECTOMY     Family History  Problem Relation Age of Onset  . Hypertension Mother   . Diabetes Mother   . Hypertension Father   . Diabetes Father   . Hypertension Daughter   . Hypertension Sister   . Diabetes Sister   . Coronary artery disease Neg Hx   . Stroke Neg Hx   . Colon cancer Neg Hx   . Breast cancer Neg Hx    Social History   Socioeconomic History  . Marital status: Married    Spouse name: herbert  . Number of children: 3  . Years of education: college  . Highest education level: Not on file  Occupational History  . 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 Financial resource strain: Not hard at all  . Food insecurity:    Worry: Never true    Inability: Never true  . Transportation needs:    Medical: No  Non-medical: No  Tobacco Use  . Smoking status: Never Smoker  . Smokeless tobacco: Never Used  Substance and Sexual Activity  . Alcohol use: No  . Drug use: No  . Sexual activity: Not Currently  Lifestyle  . Physical activity:    Days per week: 0 days    Minutes per session: 0 min  . Stress: Not at all  Relationships  . Social connections:    Talks on phone: Not on file    Gets together: Not on file    Attends religious service: Not on file    Active member of club or organization: Not on file    Attends meetings of clubs or organizations: Not on file    Relationship status: Not on file  Other Topics Concern  . Not on file  Social History Narrative   Lives w/ husband   Patient is married with 3 children.   Patient is right handed.   Patient has college education.     Patient drinks 3-4 daily.    Outpatient Encounter Medications as of 09/14/2018  Medication Sig  . aspirin 81 MG tablet Take 81 mg by mouth daily.    . Cholecalciferol (VITAMIN D) 2000 UNITS CAPS Take 2,000 Units by mouth daily. Reported on 11/18/2015  . diltiazem (CARDIZEM CD) 300 MG 24 hr capsule Take 1 capsule (300 mg total) daily by mouth.  Marland Kitchen glucose blood (ONETOUCH VERIO) test strip USE TO TEST BLOOD SUGAR 3 TIMES DAILY  . Loratadine (CLARITIN PO) Take 10 mg by mouth daily as needed.   . Omega-3 Fatty Acids (OMEGA 3 PO) Take 1 tablet by mouth daily. Reported on 11/18/2015  . pioglitazone (ACTOS) 45 MG tablet Take 1 tablet (45 mg total) by mouth daily.  . pravastatin (PRAVACHOL) 20 MG tablet Take 1 tablet (20 mg total) by mouth at bedtime.  . TURMERIC PO Take 1 tablet by mouth daily. OTC  . Vitamin D, Ergocalciferol, (DRISDOL) 50000 units CAPS capsule Take 50,000 Units by mouth 2 (two) times a week. Takes on Tuesdays and Fridays  . diltiazem (TIAZAC) 300 MG 24 hr capsule Take 300 mg by mouth daily.  . DULoxetine (CYMBALTA) 30 MG capsule Take 30 mg by mouth daily.  . fluticasone (FLONASE) 50 MCG/ACT nasal spray Place 2 sprays into both nostrils daily. (Patient not taking: Reported on 09/14/2018)  . glipiZIDE (GLUCOTROL XL) 5 MG 24 hr tablet Take 1/2 tablet in am and 1 tablet before supper. (Patient not taking: Reported on 09/14/2018)  . JANUMET XR 50-500 MG TB24 TAKE 2 TABLETS DAILY  . metoCLOPramide (REGLAN) 10 MG tablet Take 10 mg by mouth 4 (four) times daily.   No facility-administered encounter medications on file as of 09/14/2018.     Activities of Daily Living In your present state of health, do you have any difficulty performing the following activities: 09/14/2018  Hearing? N  Vision? N  Difficulty concentrating or making decisions? N  Walking or climbing stairs? N  Dressing or bathing? N  Doing errands, shopping? N  Preparing Food and eating ? N  Using the Toilet? N  In  the past six months, have you accidently leaked urine? N  Do you have problems with loss of bowel control? N  Managing your Medications? N  Managing your Finances? N  Housekeeping or managing your Housekeeping? N  Some recent data might be hidden    Patient Care Team: Dorothyann Peng, MD as PCP - General (Internal Medicine) Micki Riley, MD as  Consulting Physician (Neurology) Tawni Pummel, PA-C as Physician Assistant (Orthopedic Surgery) Pollyann Savoy, MD as Consulting Physician (Rheumatology) Jethro Bolus, MD as Consulting Physician (Ophthalmology) Sallye Lat, MD as Consulting Physician (Ophthalmology)    Assessment:   This is a routine wellness examination for Corra.  Exercise Activities and Dietary recommendations Current Exercise Habits: The patient does not participate in regular exercise at present, Exercise limited by: None identified  Goals    . DIET - INCREASE WATER INTAKE (pt-stated)    . Increase physical activity     Start walking with your husband as able.       Fall Risk Fall Risk  09/14/2018 02/03/2018 10/13/2017 09/02/2017 07/22/2017  Falls in the past year? No No Yes No No  Injury with Fall? - - No - -  Risk for fall due to : Medication side effect - - - -   Is the patient's home free of loose throw rugs in walkways, pet beds, electrical cords, etc?   yes      Grab bars in the bathroom? yes      Handrails on the stairs?   n/a      Adequate lighting?   yes  Timed Get Up and Go performed: n/a  Depression Screen PHQ 2/9 Scores 09/14/2018 02/03/2018 10/13/2017 09/02/2017  PHQ - 2 Score 0 0 0 0     Cognitive Function MMSE - Mini Mental State Exam 06/15/2016  Orientation to time 5  Orientation to Place 5  Registration 3  Attention/ Calculation 5  Recall 1  Language- name 2 objects 2  Language- repeat 1  Language- follow 3 step command 3  Language- read & follow direction 1  Write a sentence 1  Copy design 1  Total score 28       6CIT Screen 09/14/2018  What Year? 0 points  What month? 0 points  What time? 3 points  Count back from 20 0 points  Months in reverse 2 points  Repeat phrase 2 points  Total Score 7    Immunization History  Administered Date(s) Administered  . Influenza Split 08/01/2012  . Influenza Whole 10/14/2009  . Influenza, High Dose Seasonal PF 09/12/2013, 08/19/2015, 09/14/2018  . Influenza,inj,Quad PF,6+ Mos 09/24/2014  . Pneumococcal Conjugate-13 01/15/2014  . Pneumococcal Polysaccharide-23 03/31/2012  . Td 08/14/2010    Qualifies for Shingles Vaccine? yes  Screening Tests Health Maintenance  Topic Date Due  . MAMMOGRAM  10/05/2016  . PNA vac Low Risk Adult (2 of 2 - PPSV23) 03/31/2017  . OPHTHALMOLOGY EXAM  11/01/2017  . URINE MICROALBUMIN  01/14/2018  . INFLUENZA VACCINE  06/15/2018  . HEMOGLOBIN A1C  08/06/2018  . DEXA SCAN  08/06/2018  . FOOT EXAM  02/04/2019  . TETANUS/TDAP  08/14/2020  . COLONOSCOPY  12/15/2020  . Hepatitis C Screening  Completed    Cancer Screenings: Lung: Low Dose CT Chest recommended if Age 26-80 years, 30 pack-year currently smoking OR have quit w/in 15years. Patient does not qualify. Breast:  Up to date on Mammogram? No   Up to date of Bone Density/Dexa? Yes Colorectal: up to date  Additional Screenings: : Hepatitis C Screening: n/a     Plan:    Patient wants to start increasing water intake. She will follow up to see if she has had her annual eye exam. Needs to get mammogram done.  I have personally reviewed and noted the following in the patient's chart:   . Medical and social history . Use of alcohol, tobacco  or illicit drugs  . Current medications and supplements . Functional ability and status . Nutritional status . Physical activity . Advanced directives . List of other physicians . Hospitalizations, surgeries, and ER visits in previous 12 months . Vitals . Screenings to include cognitive, depression, and falls . Referrals  and appointments  In addition, I have reviewed and discussed with patient certain preventive protocols, quality metrics, and best practice recommendations. A written personalized care plan for preventive services as well as general preventive health recommendations were provided to patient.     Barb Merino, LPN  [id number]

## 2018-09-14 NOTE — Addendum Note (Signed)
Addended by: Harle Battiest on: 09/14/2018 02:11 PM   Modules accepted: Orders

## 2018-09-15 LAB — LIPID PANEL
Chol/HDL Ratio: 2 ratio (ref 0.0–4.4)
Cholesterol, Total: 213 mg/dL — ABNORMAL HIGH (ref 100–199)
HDL: 106 mg/dL (ref >39)
LDL Calculated: 90 mg/dL (ref 0–99)
Triglycerides: 85 mg/dL (ref 0–149)
VLDL Cholesterol Cal: 17 mg/dL (ref 5–40)

## 2018-09-15 LAB — CBC
Hematocrit: 39.8 % (ref 34.0–46.6)
Hemoglobin: 13.4 g/dL (ref 11.1–15.9)
MCH: 29.2 pg (ref 26.6–33.0)
MCHC: 33.7 g/dL (ref 31.5–35.7)
MCV: 87 fL (ref 79–97)
PLATELETS: 325 10*3/uL (ref 150–450)
RBC: 4.59 x10E6/uL (ref 3.77–5.28)
RDW: 13.6 % (ref 12.3–15.4)
WBC: 4.8 10*3/uL (ref 3.4–10.8)

## 2018-09-15 LAB — CMP14 + ANION GAP
A/G RATIO: 2 (ref 1.2–2.2)
ALT: 19 IU/L (ref 0–32)
AST: 17 IU/L (ref 0–40)
Albumin: 4.8 g/dL (ref 3.5–4.8)
Alkaline Phosphatase: 123 IU/L — ABNORMAL HIGH (ref 39–117)
Anion Gap: 19 mmol/L — ABNORMAL HIGH (ref 10.0–18.0)
BUN / CREAT RATIO: 10 — AB (ref 12–28)
BUN: 8 mg/dL (ref 8–27)
Bilirubin Total: 0.3 mg/dL (ref 0.0–1.2)
CO2: 22 mmol/L (ref 20–29)
Calcium: 10 mg/dL (ref 8.7–10.3)
Chloride: 103 mmol/L (ref 96–106)
Creatinine, Ser: 0.82 mg/dL (ref 0.57–1.00)
GFR, EST AFRICAN AMERICAN: 83 mL/min/{1.73_m2} (ref >59)
GFR, EST NON AFRICAN AMERICAN: 72 mL/min/{1.73_m2} (ref >59)
GLUCOSE: 163 mg/dL — AB (ref 65–99)
Globulin, Total: 2.4 g/dL (ref 1.5–4.5)
POTASSIUM: 4 mmol/L (ref 3.5–5.2)
Sodium: 144 mmol/L (ref 134–144)
Total Protein: 7.2 g/dL (ref 6.0–8.5)

## 2018-09-15 LAB — HEMOGLOBIN A1C
ESTIMATED AVERAGE GLUCOSE: 157 mg/dL
HEMOGLOBIN A1C: 7.1 % — AB (ref 4.8–5.6)

## 2018-09-15 LAB — T3, FREE: T3 FREE: 2.7 pg/mL (ref 2.0–4.4)

## 2018-09-15 LAB — T4, FREE: Free T4: 1.41 ng/dL (ref 0.82–1.77)

## 2018-09-15 LAB — TSH: TSH: 1.19 u[IU]/mL (ref 0.450–4.500)

## 2018-09-20 ENCOUNTER — Ambulatory Visit: Payer: Medicare Other | Admitting: Endocrinology

## 2018-09-20 DIAGNOSIS — Z0289 Encounter for other administrative examinations: Secondary | ICD-10-CM

## 2018-10-17 ENCOUNTER — Encounter: Payer: Self-pay | Admitting: Internal Medicine

## 2018-10-17 ENCOUNTER — Ambulatory Visit (INDEPENDENT_AMBULATORY_CARE_PROVIDER_SITE_OTHER): Payer: Medicare Other | Admitting: Internal Medicine

## 2018-10-17 VITALS — BP 130/70 | HR 90 | Temp 98.1°F | Ht 64.0 in | Wt 142.0 lb

## 2018-10-17 DIAGNOSIS — M25572 Pain in left ankle and joints of left foot: Secondary | ICD-10-CM | POA: Diagnosis not present

## 2018-10-17 DIAGNOSIS — M1000 Idiopathic gout, unspecified site: Secondary | ICD-10-CM

## 2018-10-17 MED ORDER — SITAGLIP PHOS-METFORMIN HCL ER 50-500 MG PO TB24: 2.0000 | ORAL_TABLET | Freq: Every day | ORAL | 0 refills | Status: DC

## 2018-10-17 MED ORDER — INDOMETHACIN 50 MG PO CAPS: 50.0000 mg | ORAL_CAPSULE | Freq: Three times a day (TID) | ORAL | 0 refills | Status: DC

## 2018-10-17 NOTE — Progress Notes (Signed)
Subjective:     Patient ID: [name] [name] , female    DOB: 12/27/1945 , 72 y.o.   MRN: [id number]   Chief Complaint  Patient presents with  . Foot Pain    patient states she has been having pain for the past 4 days when she walks she gets sharp pain in her foot.    HPI Onset of L medial ankle pain x 3 days, was very swollen and hot, swelling went down some. Has been taking ASA. She denies injuring herself. She denies consuming alcohol or cold cuts, did not eat much [place]key and beef. Pain is worse to move her foot up and down, and palpation. ASA helped ery little.    Past Medical History:  Diagnosis Date  . Bilateral knee pain    Euflexxa x3  . Chest pain 06/2007   CP and QW on EKG: neg stress test   . Diabetes mellitus    w/ neuropathy  . Diverticulitis 2000  . Hyperlipidemia   . Hypertension   . Insomnia 02/20/2013  . Osteoarthritis    gout, CPPD--- sees Dr.Davenshwar  . Osteoarthritis of both feet 09/14/2016  . Osteoarthritis of both hands 09/14/2016  . Osteoarthritis of both knees 09/14/2016  . Osteopenia    per DEXA 4/10  . Recurrent boils    h/o MRSA     Family History  Problem Relation Age of Onset  . Hypertension Mother   . Diabetes Mother   . Hypertension Father   . Diabetes Father   . Hypertension Daughter   . Hypertension Sister   . Diabetes Sister   . Coronary artery disease Neg Hx   . Stroke Neg Hx   . Colon cancer Neg Hx   . Breast cancer Neg Hx      Current Outpatient Medications:  .  aspirin 81 MG tablet, Take 81 mg by mouth daily.  , Disp: , Rfl:  .  Cholecalciferol (VITAMIN D) 2000 UNITS CAPS, Take 2,000 Units by mouth daily. Reported on 11/18/2015, Disp: , Rfl:  .  diltiazem (CARDIZEM CD) 300 MG 24 hr capsule, Take 1 capsule (300 mg total) daily by mouth., Disp: 30 capsule, Rfl: 0 .  DULoxetine (CYMBALTA) 30 MG capsule, Take 30 mg by mouth daily., Disp: , Rfl:  .  fluticasone (FLONASE) 50 MCG/ACT nasal spray, Place 2 sprays into both nostrils  daily., Disp: 16 g, Rfl: 6 .  glipiZIDE (GLUCOTROL XL) 5 MG 24 hr tablet, Take 1/2 tablet in am and 1 tablet before supper., Disp: 45 tablet, Rfl: 1 .  glucose blood (ONETOUCH VERIO) test strip, USE TO TEST BLOOD SUGAR 3 TIMES DAILY, Disp: 50 each, Rfl: 0 .  JANUMET XR 50-500 MG TB24, TAKE 2 TABLETS DAILY, Disp: 60 tablet, Rfl: 2 .  Loratadine (CLARITIN PO), Take 10 mg by mouth daily as needed. , Disp: , Rfl:  .  Omega-3 Fatty Acids (OMEGA 3 PO), Take 1 tablet by mouth daily. Reported on 11/18/2015, Disp: , Rfl:  .  pioglitazone (ACTOS) 45 MG tablet, Take 1 tablet (45 mg total) by mouth daily., Disp: 90 tablet, Rfl: 3 .  pravastatin (PRAVACHOL) 20 MG tablet, Take 1 tablet (20 mg total) by mouth at bedtime., Disp: 30 tablet, Rfl: 3 .  TURMERIC PO, Take 1 tablet by mouth daily. OTC, Disp: , Rfl:  .  metoCLOPramide (REGLAN) 10 MG tablet, Take 10 mg by mouth 4 (four) times daily., Disp: , Rfl:    Allergies  Allergen Reactions  .  Colcrys [Colchicine] Nausea Only  . Hydrocodone   . Sulindac   . Tramadol     "Made me feel like I was in another world."  . Celecoxib Rash  . Glimepiride Rash     Review of Systems  Constitutional: Negative for chills and fever.  Musculoskeletal: Positive for arthralgias, gait problem and joint swelling. Negative for myalgias.       See HPI  Skin: Positive for color change. Negative for pallor, rash and wound.     Today's Vitals   10/17/18 1214  BP: 130/70  Pulse: 90  Temp: 98.1 F (36.7 C)  TempSrc: Oral  SpO2: 98%  Weight: 142 lb (64.4 kg)  Height: 5\' 4"  (1.626 m)  PainSc: 2   PainLoc: Foot   Body mass index is 24.37 kg/m.   Objective:  Physical Exam  Constitutional: She is oriented to person, place, and time. She appears well-developed and well-nourished. No distress.  HENT:  Head: Normocephalic.  Right Ear: External ear normal.  Left Ear: External ear normal.  Nose: Nose normal.  Eyes: Conjunctivae are normal. No scleral icterus.  Neck:  Neck supple.  Pulmonary/Chest: Effort normal.  Musculoskeletal:  L medial ankle with erythema and is extending to proximal medial foot. No streaking. No open sores or wounds between toes and sole of foot  Lymphadenopathy:    She has no cervical adenopathy.  Neurological: She is alert and oriented to person, place, and time.  Skin: Skin is warm and dry. Capillary refill takes less than 2 seconds. No rash noted. She is not diaphoretic. There is erythema.  Psychiatric: She has a normal mood and affect. Her behavior is normal. Judgment and thought content normal.  Nursing note and vitals reviewed.   Assessment And Plan:    1. Acute idiopathic gout, unspecified site - Uric acid - CBC with Diff I placed her on Indocin 50 mg tid x 7 days. She is able to tolerate Ibuprofen fine.  2. Acute left ankle pain- acute. I ordered a CBC to check for possible infection as well.     [name] Widmer RODRIGUEZ-SOUTHWORTH, PA-C

## 2018-10-17 NOTE — Patient Instructions (Signed)

## 2018-10-18 ENCOUNTER — Telehealth: Payer: Self-pay

## 2018-10-18 LAB — CBC WITH DIFFERENTIAL/PLATELET
Basophils Absolute: 0.1 10*3/uL (ref 0.0–0.2)
Basos: 1 %
EOS (ABSOLUTE): 0.1 10*3/uL (ref 0.0–0.4)
Eos: 1 %
HEMOGLOBIN: 12.6 g/dL (ref 11.1–15.9)
Hematocrit: 37.4 % (ref 34.0–46.6)
Immature Grans (Abs): 0 10*3/uL (ref 0.0–0.1)
Immature Granulocytes: 0 %
Lymphocytes Absolute: 2 10*3/uL (ref 0.7–3.1)
Lymphs: 27 %
MCH: 28.8 pg (ref 26.6–33.0)
MCHC: 33.7 g/dL (ref 31.5–35.7)
MCV: 86 fL (ref 79–97)
MONOCYTES: 8 %
Monocytes Absolute: 0.6 10*3/uL (ref 0.1–0.9)
NEUTROS PCT: 63 %
Neutrophils Absolute: 4.6 10*3/uL (ref 1.4–7.0)
Platelets: 349 10*3/uL (ref 150–450)
RBC: 4.37 x10E6/uL (ref 3.77–5.28)
RDW: 13.1 % (ref 12.3–15.4)
WBC: 7.2 10*3/uL (ref 3.4–10.8)

## 2018-10-18 LAB — URIC ACID: Uric Acid: 5.1 mg/dL (ref 2.5–7.1)

## 2018-10-18 NOTE — Telephone Encounter (Signed)
-----   Message from Garey HamSylvia Rodriguez-Southworth, [place] JerseyPA-C sent at 10/18/2018 10:06 AM EST ----- Please call pt and tell her that her cell count is normal and shows no sign of infection, and her uric acid is in the upper normal level. Could be was higher and the aspirin helped bring it down. So I suspect she has gout. She needs to continue the medication I prescribed.

## 2018-10-31 ENCOUNTER — Other Ambulatory Visit: Payer: Self-pay | Admitting: Internal Medicine

## 2018-11-02 ENCOUNTER — Telehealth: Payer: Self-pay | Admitting: Endocrinology

## 2018-11-02 ENCOUNTER — Other Ambulatory Visit: Payer: Self-pay

## 2018-11-02 MED ORDER — GLUCOSE BLOOD VI STRP: ORAL_STRIP | 0 refills | Status: DC

## 2018-11-02 NOTE — Telephone Encounter (Signed)
This has been sent

## 2018-11-02 NOTE — Telephone Encounter (Signed)
glucose blood (ONETOUCH VERIO) test strip  Patient stated she spoke with the pharmacy yesterday to have the send over a refill request. She is completely out of her strips and has not been able to test her sugars today    [id number] - [place], [place] - [address]

## 2018-11-16 ENCOUNTER — Encounter: Payer: Self-pay | Admitting: Internal Medicine

## 2018-11-16 ENCOUNTER — Ambulatory Visit (INDEPENDENT_AMBULATORY_CARE_PROVIDER_SITE_OTHER): Payer: Medicare Other | Admitting: Internal Medicine

## 2018-11-16 VITALS — BP 120/70 | HR 76 | Temp 97.7°F | Ht 64.0 in | Wt 141.6 lb

## 2018-11-16 DIAGNOSIS — E1165 Type 2 diabetes mellitus with hyperglycemia: Secondary | ICD-10-CM

## 2018-11-16 DIAGNOSIS — Z7982 Long term (current) use of aspirin: Secondary | ICD-10-CM

## 2018-11-16 DIAGNOSIS — G44019 Episodic cluster headache, not intractable: Secondary | ICD-10-CM

## 2018-11-16 DIAGNOSIS — I10 Essential (primary) hypertension: Secondary | ICD-10-CM

## 2018-11-16 LAB — CBC
Hematocrit: 35.4 % (ref 34.0–46.6)
Hemoglobin: 12.3 g/dL (ref 11.1–15.9)
MCH: 29.3 pg (ref 26.6–33.0)
MCHC: 34.7 g/dL (ref 31.5–35.7)
MCV: 84 fL (ref 79–97)
PLATELETS: 260 10*3/uL (ref 150–450)
RBC: 4.2 x10E6/uL (ref 3.77–5.28)
RDW: 13.3 % (ref 12.3–15.4)
WBC: 4.9 10*3/uL (ref 3.4–10.8)

## 2018-11-16 LAB — CMP14 + ANION GAP
ALT: 16 IU/L (ref 0–32)
AST: 19 IU/L (ref 0–40)
Albumin/Globulin Ratio: 1.6 (ref 1.2–2.2)
Albumin: 4.2 g/dL (ref 3.5–4.8)
Alkaline Phosphatase: 122 IU/L — ABNORMAL HIGH (ref 39–117)
Anion Gap: 14 mmol/L (ref 10.0–18.0)
BUN/Creatinine Ratio: 12 (ref 12–28)
BUN: 9 mg/dL (ref 8–27)
Bilirubin Total: 0.3 mg/dL (ref 0.0–1.2)
CO2: 23 mmol/L (ref 20–29)
Calcium: 9.8 mg/dL (ref 8.7–10.3)
Chloride: 102 mmol/L (ref 96–106)
Creatinine, Ser: 0.75 mg/dL (ref 0.57–1.00)
GFR calc Af Amer: 93 mL/min/{1.73_m2} (ref >59)
GFR calc non Af Amer: 80 mL/min/{1.73_m2} (ref >59)
GLOBULIN, TOTAL: 2.6 g/dL (ref 1.5–4.5)
Glucose: 146 mg/dL — ABNORMAL HIGH (ref 65–99)
Potassium: 4.1 mmol/L (ref 3.5–5.2)
Sodium: 139 mmol/L (ref 134–144)
Total Protein: 6.8 g/dL (ref 6.0–8.5)

## 2018-11-16 LAB — HEMOGLOBIN A1C
Est. average glucose Bld gHb Est-mCnc: 154 mg/dL
Hgb A1c MFr Bld: 7 % — ABNORMAL HIGH (ref 4.8–5.6)

## 2018-11-16 NOTE — Progress Notes (Signed)
Subjective:     Patient ID: [name] [name] , female    DOB: 09/19/1946 , 73 y.o.   MRN: [id number]   Chief Complaint  Patient presents with  . Diabetes    HPI  Here for DM and average has been in the low 100's. She woke up with a HA on R parietal region, and has not taken anything for this. She also has not eaten at all so she can have her labs.   Past Medical History:  Diagnosis Date  . Bilateral knee pain    Euflexxa x3  . Chest pain 06/2007   CP and QW on EKG: neg stress test   . Diabetes mellitus    w/ neuropathy  . Diverticulitis 2000  . Hyperlipidemia   . Hypertension   . Insomnia 02/20/2013  . Osteoarthritis    gout, CPPD--- sees Dr.Davenshwar  . Osteoarthritis of both feet 09/14/2016  . Osteoarthritis of both hands 09/14/2016  . Osteoarthritis of both knees 09/14/2016  . Osteopenia    per DEXA 4/10  . Recurrent boils    h/o MRSA     Family History  Problem Relation Age of Onset  . Hypertension Mother   . Diabetes Mother   . Hypertension Father   . Diabetes Father   . Hypertension Daughter   . Hypertension Sister   . Diabetes Sister   . Coronary artery disease Neg Hx   . Stroke Neg Hx   . Colon cancer Neg Hx   . Breast cancer Neg Hx      Current Outpatient Medications:  .  aspirin 81 MG tablet, Take 81 mg by mouth daily.  , Disp: , Rfl:  .  Cholecalciferol (VITAMIN D) 2000 UNITS CAPS, Take 2,000 Units by mouth daily. Reported on 11/18/2015, Disp: , Rfl:  .  diltiazem (CARDIZEM CD) 300 MG 24 hr capsule, Take 1 capsule (300 mg total) daily by mouth., Disp: 30 capsule, Rfl: 0 .  DULoxetine (CYMBALTA) 30 MG capsule, Take 30 mg by mouth daily., Disp: , Rfl:  .  fluticasone (FLONASE) 50 MCG/ACT nasal spray, Place 2 sprays into both nostrils daily., Disp: 16 g, Rfl: 6 .  glipiZIDE (GLUCOTROL XL) 5 MG 24 hr tablet, Take 1/2 tablet in am and 1 tablet before supper., Disp: 45 tablet, Rfl: 1 .  glucose blood (ONETOUCH VERIO) test strip, USE TO TEST BLOOD SUGAR 3  TIMES DAILY, Disp: 100 each, Rfl: 0 .  indomethacin (INDOCIN) 50 MG capsule, Take 1 capsule (50 mg total) by mouth 3 (three) times daily with meals., Disp: 21 capsule, Rfl: 0 .  Loratadine (CLARITIN PO), Take 10 mg by mouth daily as needed. , Disp: , Rfl:  .  metoCLOPramide (REGLAN) 10 MG tablet, Take 10 mg by mouth 4 (four) times daily., Disp: , Rfl:  .  Omega-3 Fatty Acids (OMEGA 3 PO), Take 1 tablet by mouth daily. Reported on 11/18/2015, Disp: , Rfl:  .  pioglitazone (ACTOS) 45 MG tablet, Take 1 tablet (45 mg total) by mouth daily., Disp: 90 tablet, Rfl: 3 .  pravastatin (PRAVACHOL) 20 MG tablet, TAKE 1 TABLET BY MOUTH EVERY DAY, Disp: 30 tablet, Rfl: 3 .  SitaGLIPtin-MetFORMIN HCl (JANUMET XR) 50-500 MG TB24, Take 2 tablets by mouth daily., Disp: 60 tablet, Rfl: 0 .  TURMERIC PO, Take 1 tablet by mouth daily. OTC, Disp: , Rfl:    Allergies  Allergen Reactions  . Colcrys [Colchicine] Nausea Only  . Hydrocodone   . Sulindac   .  Tramadol     "Made me feel like I was in another world."  . Celecoxib Rash  . Glimepiride Rash     Review of Systems  Constitutional: Positive for fatigue. Negative for appetite change, diaphoresis and fever.  HENT: Negative.  Negative for postnasal drip and rhinorrhea.   Respiratory: Negative for chest tightness and shortness of breath.   Gastrointestinal: Negative for abdominal distention, abdominal pain and constipation.  Musculoskeletal: Negative for gait problem.  Skin: Negative for rash.  Neurological: Positive for headaches. Negative for tremors, seizures, syncope, weakness and numbness.       HA since today 7/10.   Hematological: Negative for adenopathy.  Psychiatric/Behavioral: Positive for sleep disturbance.       Denies depression or axiety    Today's Vitals   11/16/18 1015  BP: 120/70  Pulse: 76  Temp: 97.7 F (36.5 C)  TempSrc: Oral  SpO2: 96%  Weight: 141 lb 9.6 oz (64.2 kg)  Height: 5\' 4"  (1.626 m)   Body mass index is 24.31 kg/m.    Objective:  Physical Exam   Constitutional: She is oriented to person, place, and time. She appears well-developed and well-nourished. No distress.  HEENT: PERRLA EOMI Head: Normocephalic and atraumatic.  Right Ear: External ear normal.  Left Ear: External ear normal.  Nose: Nose normal.  Eyes: Conjunctivae are normal. Right eye exhibits no discharge. Left eye exhibits no discharge. No scleral icterus.  Neck: Neck supple. No thyromegaly present.  No carotid bruits bilaterally  Cardiovascular: Normal rate and regular rhythm.  No murmur heard. Pulmonary/Chest: Effort normal and breath sounds normal. No respiratory distress.  Musculoskeletal: Normal range of motion. She exhibits no edema.  Lymphadenopathy:    She has no cervical adenopathy.  Neurological: She is alert and oriented to person, place, and time. Normal Rhomberg, tandem gait, upper and lower extremity strength 5/5 and symmetric.  Skin: Skin is warm and dry. Capillary refill takes less than 2 seconds. No rash noted. She is not diaphoretic.  Psychiatric: She has a normal mood and affect. Her behavior is normal. Judgment and thought content normal.  Nursing note reviewed.    Assessment And Plan:     1. Uncontrolled type 2 diabetes mellitus with hyperglycemia (HCC)- chronic. Will stay on current meds unless labs are abnormal.  - Hemoglobin A1c  2. Essential hypertension- stable. May continue same meds  - CMP14 + Anion Gap - CBC no Diff 3- Acute HA with no neurological findings- may take Tylenol prn Fu 3 months with Dr Laurena Slimmer, PA-C

## 2018-11-30 ENCOUNTER — Telehealth: Payer: Self-pay | Admitting: Endocrinology

## 2018-11-30 NOTE — Telephone Encounter (Signed)
Pt needs follow up appt per previous phone note by Dr. Everardo AllEllison

## 2018-11-30 NOTE — Telephone Encounter (Signed)
MEDICATION: glucose blood (ONETOUCH VERIO) test strip  PHARMACY:  [id number] - [place], [place] - [address]  IS THIS A 90 DAY SUPPLY : yes  IS PATIENT OUT OF MEDICATION:  yes  IF NOT; HOW MUCH IS LEFT:   LAST APPOINTMENT DATE: @12 /19/2019  NEXT APPOINTMENT DATE:@Visit  date not found  DO WE HAVE YOUR PERMISSION TO LEAVE A DETAILED MESSAGE:  OTHER COMMENTS:    **Let patient know to contact pharmacy at the end of the day to make sure medication is ready. **  ** Please notify patient to allow 48-72 hours to process**  **Encourage patient to contact the pharmacy for refills or they can [facility]**

## 2018-12-05 ENCOUNTER — Other Ambulatory Visit: Payer: Self-pay

## 2018-12-05 DIAGNOSIS — E1165 Type 2 diabetes mellitus with hyperglycemia: Secondary | ICD-10-CM

## 2018-12-05 MED ORDER — GLUCOSE BLOOD VI STRP: ORAL_STRIP | 0 refills | Status: AC

## 2019-01-10 ENCOUNTER — Other Ambulatory Visit: Payer: Self-pay | Admitting: Endocrinology

## 2019-01-10 DIAGNOSIS — E1165 Type 2 diabetes mellitus with hyperglycemia: Secondary | ICD-10-CM

## 2019-01-17 ENCOUNTER — Other Ambulatory Visit: Payer: Self-pay | Admitting: Internal Medicine

## 2019-01-19 ENCOUNTER — Ambulatory Visit (INDEPENDENT_AMBULATORY_CARE_PROVIDER_SITE_OTHER): Payer: Medicare Other | Admitting: Rheumatology

## 2019-01-19 ENCOUNTER — Encounter: Payer: Self-pay | Admitting: Rheumatology

## 2019-01-19 VITALS — BP 130/77 | HR 85 | Resp 14 | Ht 63.0 in | Wt 144.0 lb

## 2019-01-19 DIAGNOSIS — M7062 Trochanteric bursitis, left hip: Secondary | ICD-10-CM

## 2019-01-19 DIAGNOSIS — Z8739 Personal history of other diseases of the musculoskeletal system and connective tissue: Secondary | ICD-10-CM | POA: Diagnosis not present

## 2019-01-19 DIAGNOSIS — M7061 Trochanteric bursitis, right hip: Secondary | ICD-10-CM

## 2019-01-19 DIAGNOSIS — M170 Bilateral primary osteoarthritis of knee: Secondary | ICD-10-CM | POA: Diagnosis not present

## 2019-01-19 DIAGNOSIS — Z8719 Personal history of other diseases of the digestive system: Secondary | ICD-10-CM

## 2019-01-19 DIAGNOSIS — M19072 Primary osteoarthritis, left ankle and foot: Secondary | ICD-10-CM

## 2019-01-19 DIAGNOSIS — M8589 Other specified disorders of bone density and structure, multiple sites: Secondary | ICD-10-CM

## 2019-01-19 DIAGNOSIS — Z87898 Personal history of other specified conditions: Secondary | ICD-10-CM

## 2019-01-19 DIAGNOSIS — M19041 Primary osteoarthritis, right hand: Secondary | ICD-10-CM

## 2019-01-19 DIAGNOSIS — M19071 Primary osteoarthritis, right ankle and foot: Secondary | ICD-10-CM | POA: Diagnosis not present

## 2019-01-19 DIAGNOSIS — G8929 Other chronic pain: Secondary | ICD-10-CM

## 2019-01-19 DIAGNOSIS — M25562 Pain in left knee: Secondary | ICD-10-CM

## 2019-01-19 DIAGNOSIS — M19042 Primary osteoarthritis, left hand: Secondary | ICD-10-CM

## 2019-01-19 DIAGNOSIS — Z8639 Personal history of other endocrine, nutritional and metabolic disease: Secondary | ICD-10-CM

## 2019-01-19 DIAGNOSIS — Z8679 Personal history of other diseases of the circulatory system: Secondary | ICD-10-CM

## 2019-01-19 MED ORDER — TRIAMCINOLONE ACETONIDE 40 MG/ML IJ SUSP
40.0000 mg | INTRAMUSCULAR | Status: AC | PRN
  Administered 2019-01-19: 40 mg via INTRA_ARTICULAR

## 2019-01-19 MED ORDER — LIDOCAINE HCL 1 % IJ SOLN
1.5000 mL | INTRAMUSCULAR | Status: AC | PRN
  Administered 2019-01-19: 1.5 mL

## 2019-01-19 NOTE — Progress Notes (Signed)
Office Visit Note  Patient: [name] [name]             Date of Birth: 1946/02/02           MRN: [id number]             PCP: Dorothyann Peng, MD Referring: Dorothyann Peng, MD Visit Date: 01/19/2019 Occupation: @GUAROCC @  Subjective:  Left knee joint pain   History of Present Illness: [name] [name] is a 73 y.o. female with history of osteoarthritis and CPPD.  She presents today with left knee pain that started 2 days ago.  She states that she has no swelling and warmth in the left knee.  She denies any recent injuries or overuse activities.  She states that she has been using Aspercreme topically has provided some pain relief.  She would like a left knee cortisone injection today.  She denies any other joint pain or joint swelling at this time.  Activities of Daily Living:  Patient reports morning stiffness for0  minutes.   Patient Reports nocturnal pain.  Difficulty dressing/grooming: Denies Difficulty climbing stairs: Reports Difficulty getting out of chair: Reports Difficulty using hands for taps, buttons, cutlery, and/or writing: Denies  Review of Systems  Constitutional: Positive for fatigue.  HENT: Negative for mouth sores, mouth dryness and nose dryness.   Eyes: Negative for pain, visual disturbance and dryness.  Respiratory: Negative for cough, hemoptysis, shortness of breath and difficulty breathing.   Cardiovascular: Negative for chest pain, palpitations, hypertension and swelling in legs/feet.  Gastrointestinal: Negative for blood in stool, constipation and diarrhea.  Endocrine: Negative for increased urination.  Genitourinary: Negative for painful urination.  Musculoskeletal: Positive for arthralgias, joint pain and joint swelling. Negative for myalgias, muscle weakness, morning stiffness, muscle tenderness and myalgias.  Skin: Negative for color change, pallor, rash, hair loss, nodules/bumps, skin tightness, ulcers and sensitivity to sunlight.    Allergic/Immunologic: Negative for susceptible to infections.  Neurological: Negative for dizziness, numbness, headaches and weakness.  Hematological: Negative for swollen glands.  Psychiatric/Behavioral: Negative for depressed mood and sleep disturbance. The patient is not nervous/anxious.     PMFS History:  Patient Active Problem List   Diagnosis Date Noted  . 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 History of hypertension 12/08/2016  . History of diabetes mellitus 12/08/2016  . Dyslipidemia 12/08/2016  . Osteoarthritis of both hands 09/14/2016  . Osteoarthritis of both feet 09/14/2016  . Osteoarthritis of both knees 09/14/2016  . PCP NOTES >>> 08/19/2015  . Low back pain 09/09/2014  . Neuralgia of left saphenous nerve 06/05/2014  . Numbness and tingling in left arm 06/05/2014  . Paresthesia of left arm 05/21/2014  . Gastroparesis 02/20/2013  . Insomnia 02/20/2013  . Annual physical exam 03/31/2012  . History of diverticulitis 04/14/2010  . Osteopenia 05/14/2009  . DYSPHAGIA UNSPECIFIED 01/10/2009  . 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 Hyperlipidemia 06/14/2007  . Essential hypertension 06/14/2007  . Osteoarthritis 06/14/2007  . DM (diabetes mellitus) (HCC) 01/02/2007    Past Medical History:  Diagnosis Date  . Bilateral knee pain    Euflexxa x3  . Chest pain 06/2007   CP and QW on EKG: neg stress test   . Diabetes mellitus    w/ neuropathy  . Diverticulitis 2000  . Hyperlipidemia   . Hypertension   . Insomnia 02/20/2013  . Osteoarthritis    gout, CPPD--- sees Dr.Davenshwar  . Osteoarthritis of both feet 09/14/2016  . Osteoarthritis of both hands 09/14/2016  . Osteoarthritis of both  knees 09/14/2016  . Osteopenia    per DEXA 4/10  . Recurrent boils    h/o MRSA    Family History  Problem Relation Age of Onset  . Hypertension Mother   . Diabetes Mother   . Hypertension Father   . Diabetes Father   . Hypertension Daughter   . Hypertension Sister   . Diabetes Sister    . Coronary artery disease Neg Hx   . Stroke Neg Hx   . Colon cancer Neg Hx   . Breast cancer Neg Hx    Past Surgical History:  Procedure Laterality Date  . CHOLECYSTECTOMY    . TONSILLECTOMY     Social History   Social History Narrative   Lives w/ husband   Patient is married with 3 children.   Patient is right handed.   Patient has college education.   Patient drinks 3-4 daily.   Immunization History  Administered Date(s) Administered  . Influenza Split 08/01/2012  . Influenza Whole 10/14/2009  . Influenza, High Dose Seasonal PF 09/12/2013, 08/19/2015, 09/14/2018  . Influenza,inj,Quad PF,6+ Mos 09/24/2014  . Pneumococcal Conjugate-13 01/15/2014  . Pneumococcal Polysaccharide-23 03/31/2012  . Td 08/14/2010     Objective: Vital Signs: BP 130/77 (BP Location: Right Arm, Patient Position: Sitting, Cuff Size: Normal)   Pulse 85   Resp 14   Ht 5\' 3"  (1.6 m)   Wt 144 lb (65.3 kg)   BMI 25.51 kg/m    Physical Exam Vitals signs and nursing note reviewed.  Constitutional:      Appearance: She is well-developed.  HENT:     Head: Normocephalic and atraumatic.  Eyes:     Conjunctiva/sclera: Conjunctivae normal.  Neck:     Musculoskeletal: Normal range of motion.  Cardiovascular:     Rate and Rhythm: Normal rate and regular rhythm.     Heart sounds: Normal heart sounds.  Pulmonary:     Effort: Pulmonary effort is normal.     Breath sounds: Normal breath sounds.  Abdominal:     General: Bowel sounds are normal.     Palpations: Abdomen is soft.  Lymphadenopathy:     Cervical: No cervical adenopathy.  Skin:    General: Skin is warm and dry.     Capillary Refill: Capillary refill takes less than 2 seconds.  Neurological:     Mental Status: She is alert and oriented to person, place, and time.  Psychiatric:        Behavior: Behavior normal.      Musculoskeletal Exam: C-spine, thoracic spine, and lumbar spine good ROM.  No midline spinal tenderness.  No SI joint  tenderness.  Shoulder joints, elbow joints, wrist joints, MCPs, PIPs, and DIPs good ROM with no synovitis.  Severe PIP and DIP synovial thickening consistent with osteoarthritis. Incomplete fist formation bilaterally.  Bilateral CMC joint synovial thickening.  Hip joints good ROM with no discomfort.  Right knee good ROM with no warmth or effusion.  Left knee warmth and inflammation noted.  She has painful ROM of the left.  No erythema noted.  Ankle joints, MTPs, PIPs, and DIPs good ROM with no synovitis.  No warmth or effusion of ankle joints.   CDAI Exam: CDAI Score: Not documented Patient Global Assessment: Not documented; Provider Global Assessment: Not documented Swollen: Not documented; Tender: Not documented Joint Exam   Not documented   There is currently no information documented on the homunculus. Go to the Rheumatology activity and complete the homunculus joint exam.  Investigation: No  additional findings.  Imaging: No results found.  Recent Labs: Lab Results  Component Value Date   WBC 4.9 11/16/2018   HGB 12.3 11/16/2018   PLT 260 11/16/2018   NA 139 11/16/2018   K 4.1 11/16/2018   CL 102 11/16/2018   CO2 23 11/16/2018   GLUCOSE 146 (H) 11/16/2018   BUN 9 11/16/2018   CREATININE 0.75 11/16/2018   BILITOT 0.3 11/16/2018   ALKPHOS 122 (H) 11/16/2018   AST 19 11/16/2018   ALT 16 11/16/2018   PROT 6.8 11/16/2018   ALBUMIN 4.2 11/16/2018   CALCIUM 9.8 11/16/2018   GFRAA 93 11/16/2018    Speciality Comments: No specialty comments available.  Procedures:  Large Joint Inj: L knee on 01/19/2019 10:19 AM Indications: pain Details: 27 G 1.5 in needle, medial approach  Arthrogram: No  Medications: 1.5 mL lidocaine 1 %; 40 mg triamcinolone acetonide 40 MG/ML Aspirate: 0 mL Outcome: tolerated well, no immediate complications Procedure, treatment alternatives, risks and benefits explained, specific risks discussed. Consent was given by the patient. Immediately prior to  procedure a time out was called to verify the correct patient, procedure, equipment, support staff and site/side marked as required. Patient was prepped and draped in the usual sterile fashion.     Allergies: Colcrys [colchicine]; Hydrocodone; Sulindac; Tramadol; Celecoxib; and Glimepiride   Assessment / Plan:     Visit Diagnoses: Primary osteoarthritis of both knees: She presents today with left knee joint pain that started 2 days ago.  She has warmth and inflammation of the left knee.  Right knee has no warmth or effusion is good range of motion.  A left knee cortisone injection was performed today.  Primary osteoarthritis of both hands: She has severe PIP and DIP synovial thickening consistent with osteoarthritis of bilateral hands.  She has incomplete fist formation bilaterally.  She has no tenderness or synovitis on exam.  Joint protection and muscle strengthening were discussed.  Primary osteoarthritis of both feet: She has no discomfort at this time.  She wears proper fitting shoes.  History of calcium pyrophosphate deposition disease (CPPD):   Chronic pain of left knee: X-rays of the left knee were obtained on 01/10/2018 that revealed severe osteoarthritis with chondrocalcinosis and severe chondromalacia patella.  She had a left knee cortisone injection performed on 01/10/18 that provided significant relief.  She presents today with severe left knee joint pain x2 days.  No injury or overuse activity.  No mechanical symptoms. She has warmth and inflammation on exam.  She has painful range of motion but no erythema is noted.  She requested a left knee cortisone injection.  The procedure note was completed above.  Aftercare was discussed.  Osteopenia of multiple sites: She is taking a vitamin D supplement daily.   Trochanteric bursitis of both hips: Resolved. She has no tenderness at this time.   Other medical conditions are listed as follows:   History of hyperlipidemia  History of  diabetes mellitus  History of diverticulitis  History of hypertension  History of insomnia   Orders: Orders Placed This Encounter  Procedures  . Large Joint Inj   No orders of the defined types were placed in this encounter.    Follow-Up Instructions: Return in about 6 months (around 07/22/2019) for Osteoarthritis, CPPD.   Gearldine Bienenstockaylor M , PA-C  Note - This record has been created using Dragon software.  Chart creation errors have been sought, but may not always  have been located. Such creation errors do not reflect  on  the standard of medical care.

## 2019-01-22 ENCOUNTER — Ambulatory Visit: Payer: Medicare Other | Admitting: Rheumatology

## 2019-02-08 ENCOUNTER — Other Ambulatory Visit: Payer: Self-pay

## 2019-02-08 NOTE — Patient Outreach (Signed)
[facility]) Care Management  02/08/2019  [name] [name] [birth year] [id number]   Medication Adherence call to Mrs. [name] [name]  HIPPA Compliant Voice message left with a call back number. [name] [name] is showing due on Pravastatin 20 mg under [facility].   Lillia Abed CPhT Pharmacy Technician [facility] Management Direct Dial [phone]  Fax [phone] [email or web address]

## 2019-02-14 ENCOUNTER — Ambulatory Visit: Payer: Medicare Other | Admitting: Internal Medicine

## 2019-02-14 ENCOUNTER — Ambulatory Visit: Payer: Medicare Other | Admitting: Nurse Practitioner

## 2019-03-08 ENCOUNTER — Telehealth: Payer: Self-pay

## 2019-03-21 ENCOUNTER — Telehealth: Payer: Self-pay

## 2019-03-21 NOTE — Telephone Encounter (Signed)
Pt has changed primary care practices and now see dr shelton.

## 2019-03-26 ENCOUNTER — Ambulatory Visit: Payer: Medicare Other | Admitting: Nurse Practitioner

## 2019-04-27 ENCOUNTER — Telehealth: Payer: Self-pay

## 2019-04-27 NOTE — Telephone Encounter (Signed)
LOV 02/03/18. Per Dr. Loanne Drilling, f/u in 3 mo. Called pt to reschedule appt. Unable to LVM d/t mailbox being full.

## 2019-05-23 ENCOUNTER — Other Ambulatory Visit: Payer: Self-pay

## 2019-05-25 ENCOUNTER — Ambulatory Visit: Payer: Medicare Other | Admitting: Endocrinology

## 2019-05-25 DIAGNOSIS — Z0289 Encounter for other administrative examinations: Secondary | ICD-10-CM

## 2019-06-13 ENCOUNTER — Other Ambulatory Visit: Payer: Self-pay | Admitting: Endocrinology

## 2019-06-13 DIAGNOSIS — E1165 Type 2 diabetes mellitus with hyperglycemia: Secondary | ICD-10-CM

## 2019-06-13 NOTE — Telephone Encounter (Signed)
No refill until pt schedules f/u

## 2019-06-13 NOTE — Telephone Encounter (Signed)
LOV 02/03/18. Attempt was made to reach pt to schedule her for an appt but her mailbox was full. No future appts noted. Please advise how you wish to proceed.

## 2019-06-18 NOTE — Telephone Encounter (Signed)
Created in error

## 2019-06-25 ENCOUNTER — Other Ambulatory Visit: Payer: Self-pay

## 2019-06-25 DIAGNOSIS — Z20822 Contact with and (suspected) exposure to covid-19: Secondary | ICD-10-CM

## 2019-06-26 LAB — NOVEL CORONAVIRUS, NAA: SARS-CoV-2, NAA: DETECTED — AB

## 2019-07-05 ENCOUNTER — Encounter: Payer: Self-pay | Admitting: Emergency Medicine

## 2019-07-05 ENCOUNTER — Ambulatory Visit
Admission: EM | Admit: 2019-07-05 | Discharge: 2019-07-05 | Disposition: A | Discharge status: Home / Self Care | Payer: Medicare Other | Attending: Emergency Medicine | Admitting: Emergency Medicine

## 2019-07-05 ENCOUNTER — Other Ambulatory Visit: Payer: Self-pay

## 2019-07-05 DIAGNOSIS — M25562 Pain in left knee: Secondary | ICD-10-CM

## 2019-07-05 MED ORDER — IBUPROFEN 800 MG PO TABS
800.0000 mg | ORAL_TABLET | Freq: Once | ORAL | Status: AC
  Administered 2019-07-05: 800 mg via ORAL

## 2019-07-05 NOTE — Discharge Instructions (Signed)
Call your rheumatologist tomorrow morning to schedule urgent care follow-up appointment within 1 week. Return if you develop worsening pain, swelling, redness, warmth, inability to bear weight, fever.

## 2019-07-05 NOTE — ED Notes (Signed)
Patient preferred wheelchair transport during visit.  Able to stand and pivot to and from car.

## 2019-07-05 NOTE — ED Provider Notes (Signed)
[facility]    CSN: [id number] Arrival date & time: 07/05/19  1627      History   Chief Complaint Chief Complaint  Patient presents with  . Knee Pain    HPI [name] [name] is a 73 y.o. female with history of diabetes, bilateral knee arthritis presenting for left knee pain and swelling for the last 2 to 3 days.  Patient denies mechanism of injury,, fall, trauma to the area.  Patient is able to ambulate, has walker at home she uses successfully.  Patient denies warmth, inability to bear weight, fever.  Patient has not taken anything for this as her PCP told her not to take Tylenol.  Patient has received intra-articular steroid injections from her rheumatologist: Chart review showing last in March 2020.  Patient states she tolerates these well.   Past Medical History:  Diagnosis Date  . Bilateral knee pain    Euflexxa x3  . Chest pain 06/2007   CP and QW on EKG: neg stress test   . Diabetes mellitus    w/ neuropathy  . Diverticulitis 2000  . Hyperlipidemia   . Hypertension   . Insomnia 02/20/2013  . Osteoarthritis    gout, CPPD--- sees Dr.Davenshwar  . Osteoarthritis of both feet 09/14/2016  . Osteoarthritis of both hands 09/14/2016  . Osteoarthritis of both knees 09/14/2016  . Osteopenia    per DEXA 4/10  . Recurrent boils    h/o MRSA    Patient Active Problem List   Diagnosis Date Noted  . 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 History of hypertension 12/08/2016  . History of diabetes mellitus 12/08/2016  . Dyslipidemia 12/08/2016  . Osteoarthritis of both hands 09/14/2016  . Osteoarthritis of both feet 09/14/2016  . Osteoarthritis of both knees 09/14/2016  . PCP NOTES >>> 08/19/2015  . Low back pain 09/09/2014  . Neuralgia of left saphenous nerve 06/05/2014  . Numbness and tingling in left arm 06/05/2014  . Paresthesia of left arm 05/21/2014  . Gastroparesis 02/20/2013  . Insomnia 02/20/2013  . Annual physical exam 03/31/2012  . History of  diverticulitis 04/14/2010  . Osteopenia 05/14/2009  . DYSPHAGIA UNSPECIFIED 01/10/2009  . 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 Hyperlipidemia 06/14/2007  . Essential hypertension 06/14/2007  . Osteoarthritis 06/14/2007  . DM (diabetes mellitus) ([place]) 01/02/2007    Past Surgical History:  Procedure Laterality Date  . CHOLECYSTECTOMY    . TONSILLECTOMY      OB History   No obstetric history on file.      Home Medications    Prior to Admission medications   Medication Sig Start Date End Date Taking? Authorizing Provider  aspirin 81 MG tablet Take 81 mg by mouth daily.      [provider]  Cholecalciferol (VITAMIN D) 2000 UNITS CAPS Take 2,000 Units by mouth daily. Reported on 11/18/2015    [provider]  diltiazem (TIAZAC) 300 MG 24 hr capsule TAKE 1 CAPSULE BY MOUTH EVERY DAY 01/17/19   Glendale Chard, MD  fluticasone [facility]) 50 MCG/ACT nasal spray Place 2 sprays into both nostrils daily. 11/27/15   Colon Branch, MD  glipiZIDE (GLUCOTROL XL) 5 MG 24 hr tablet Take 1/2 tablet in am and 1 tablet before supper. 08/22/17   Elayne Snare, MD  glucose blood (ONETOUCH VERIO) test strip USE TO TEST BLOOD SUGAR 3 TIMES DAILY 12/05/18   Renato Shin, MD  Loratadine (CLARITIN PO) Take 10 mg by mouth daily as needed.  [provider]  metoCLOPramide (REGLAN) 10 MG tablet Take 10 mg by mouth 4 (four) times daily.    [provider]  Omega-3 Fatty Acids (OMEGA 3 PO) Take 1 tablet by mouth daily. Reported on 11/18/2015    [provider]  pioglitazone (ACTOS) 45 MG tablet Take 1 tablet (45 mg total) by mouth daily. 02/03/18   Romero BellingEllison, Sean, MD  pravastatin (PRAVACHOL) 20 MG tablet TAKE 1 TABLET BY MOUTH EVERY DAY 10/31/18   Dorothyann PengSanders, Robyn, MD  SitaGLIPtin-MetFORMIN HCl (JANUMET XR) 50-500 MG TB24 Take 2 tablets by mouth daily. 10/17/18   Rodriguez-Southworth, Nettie ElmSylvia, PA-C  TURMERIC PO Take 1 tablet by mouth daily. OTC    [provider]    Family  History Family History  Problem Relation Age of Onset  . Hypertension Mother   . Diabetes Mother   . Hypertension Father   . Diabetes Father   . Hypertension Daughter   . Hypertension Sister   . Diabetes Sister   . Coronary artery disease Neg Hx   . Stroke Neg Hx   . Colon cancer Neg Hx   . Breast cancer Neg Hx     Social History Social History   Tobacco Use  . Smoking status: Never Smoker  . Smokeless tobacco: Never Used  Substance Use Topics  . Alcohol use: No  . Drug use: No     Allergies   Colcrys [colchicine], Hydrocodone, Sulindac, Tramadol, Celecoxib, and Glimepiride   Review of Systems Review of Systems  Constitutional: Negative for fatigue and fever.  Respiratory: Negative for cough and shortness of breath.   Cardiovascular: Negative for chest pain and palpitations.  Musculoskeletal:       Positive for left knee pain and swelling Negative for right knee pain, swelling.  Warmth, lower extremity edema  Neurological: Negative for weakness and numbness.     Physical Exam Triage Vital Signs ED Triage Vitals [07/05/19 1641]  Enc Vitals Group     BP 121/79     Pulse Rate 95     Resp 18     Temp 98.6 F (37 C)     Temp Source Oral     SpO2 95 %     Weight      Height      Head Circumference      Peak Flow      Pain Score 9     Pain Loc      Pain Edu?      Excl. in GC?    No data found.  Updated Vital Signs BP 121/79 (BP Location: Left Arm)   Pulse 95   Temp 98.6 F (37 C) (Oral)   Resp 18   SpO2 95%   Visual Acuity Right Eye Distance:   Left Eye Distance:   Bilateral Distance:    Right Eye Near:   Left Eye Near:    Bilateral Near:     Physical Exam Constitutional:      General: She is not in acute distress. HENT:     Head: Normocephalic and atraumatic.  Eyes:     General: No scleral icterus.    Conjunctiva/sclera: Conjunctivae normal.     Pupils: Pupils are equal, round, and reactive to light.  Cardiovascular:     Rate and  Rhythm: Normal rate.  Pulmonary:     Effort: Pulmonary effort is normal. No respiratory distress.  Musculoskeletal:     Comments: Knees bilaterally edematous with soft tissue swelling.  Negative blotting.  No erythema, warmth to palpation.  Mild to moderate crepitus bilaterally.  Patient has decreased active ROM in left wrist compared to right second to pain/swelling.  Knee joint stable to valgus and varus stress.  Negative anterior drawer test.  Patient is able to ambulate, though is antalgic, favoring left.  NVI bilaterally  Neurological:     General: No focal deficit present.     Mental Status: She is alert.      UC Treatments / Results  Labs (all labs ordered are listed, but only abnormal results are displayed) Labs Reviewed - No data to display  EKG   Radiology No results found.  Procedures Procedures (including critical care time)  Medications Ordered in UC Medications - No data to display  Initial Impression / Assessment and Plan / UC Course  I have reviewed the triage vital signs and the nursing notes.  Pertinent labs & imaging results that were available during my care of the patient were reviewed by me and considered in my medical decision making (see chart for details).     1.  Left knee pain Acute on chronic in known arthritic patient.  No trauma to the area: Radiography deferred.  Reviewed previous CMP from 11/16/2018: Creatinine stable at 0.82, GFR at 83.  Reviewed it is okay for patient take ibuprofen, with food to help avoid gastritis, gastric ulcers.  Patient given 100 mg in office today which she tolerated well.  Patient to follow-up with rheumatologist as she would likely benefit from knee joint steroid injection.  Will avoid systemic steroids at this time given diabetes.  Last A1c on 05/17/19: 7.0%.  Return precautions discussed, patient verbalized understanding and is agreeable to plan. Final Clinical Impressions(s) / UC Diagnoses   Final diagnoses:  Acute  pain of left knee     Discharge Instructions     Call your rheumatologist tomorrow morning to schedule urgent care follow-up appointment within 1 week. Return if you develop worsening pain, swelling, redness, warmth, inability to bear weight, fever.    ED Prescriptions    None     Controlled Substance Prescriptions [place] Controlled Substance Registry consulted? Not Applicable   Shea EvansHall-Potvin, Brittany, [place] JerseyPA-C 07/05/19 1723

## 2019-07-05 NOTE — ED Triage Notes (Signed)
[facility] for assessment of left knee pain and swelling x 2-3 days.  Denies known injury.

## 2019-07-10 NOTE — Progress Notes (Deleted)
Office Visit Note  Patient: [name] [name]             Date of Birth: 01/30/1946           MRN: [id number]             PCP: System, Provider Not In Referring: Glendale Chard, MD Visit Date: 07/24/2019 Occupation: @GUAROCC @  Subjective:  No chief complaint on file.   History of Present Illness: [name] [name] is a 73 y.o. female ***   Activities of Daily Living:  Patient reports morning stiffness for *** {minute/hour:19697}.   Patient {ACTIONS;DENIES/REPORTS:21021675::"Denies"} nocturnal pain.  Difficulty dressing/grooming: {ACTIONS;DENIES/REPORTS:21021675::"Denies"} Difficulty climbing stairs: {ACTIONS;DENIES/REPORTS:21021675::"Denies"} Difficulty getting out of chair: {ACTIONS;DENIES/REPORTS:21021675::"Denies"} Difficulty using hands for taps, buttons, cutlery, and/or writing: {ACTIONS;DENIES/REPORTS:21021675::"Denies"}  No Rheumatology ROS completed.   PMFS History:  Patient Active Problem List   Diagnosis Date Noted  . 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 History of hypertension 12/08/2016  . History of diabetes mellitus 12/08/2016  . Dyslipidemia 12/08/2016  . Osteoarthritis of both hands 09/14/2016  . Osteoarthritis of both feet 09/14/2016  . Osteoarthritis of both knees 09/14/2016  . PCP NOTES >>> 08/19/2015  . Low back pain 09/09/2014  . Neuralgia of left saphenous nerve 06/05/2014  . Numbness and tingling in left arm 06/05/2014  . Paresthesia of left arm 05/21/2014  . Gastroparesis 02/20/2013  . Insomnia 02/20/2013  . Annual physical exam 03/31/2012  . History of diverticulitis 04/14/2010  . Osteopenia 05/14/2009  . DYSPHAGIA UNSPECIFIED 01/10/2009  . 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 Hyperlipidemia 06/14/2007  . Essential hypertension 06/14/2007  . Osteoarthritis 06/14/2007  . DM (diabetes mellitus) ([place]) 01/02/2007    Past Medical History:  Diagnosis Date  . Bilateral knee pain    Euflexxa x3  . Chest pain 06/2007   CP and QW on EKG: neg stress test    . Diabetes mellitus    w/ neuropathy  . Diverticulitis 2000  . Hyperlipidemia   . Hypertension   . Insomnia 02/20/2013  . Osteoarthritis    gout, CPPD--- sees Dr.Davenshwar  . Osteoarthritis of both feet 09/14/2016  . Osteoarthritis of both hands 09/14/2016  . Osteoarthritis of both knees 09/14/2016  . Osteopenia    per DEXA 4/10  . Recurrent boils    h/o MRSA    Family History  Problem Relation Age of Onset  . Hypertension Mother   . Diabetes Mother   . Hypertension Father   . Diabetes Father   . Hypertension Daughter   . Hypertension Sister   . Diabetes Sister   . Coronary artery disease Neg Hx   . Stroke Neg Hx   . Colon cancer Neg Hx   . Breast cancer Neg Hx    Past Surgical History:  Procedure Laterality Date  . CHOLECYSTECTOMY    . TONSILLECTOMY     Social History   Social History Narrative   Lives w/ husband   Patient is married with 3 children.   Patient is right handed.   Patient has college education.   Patient drinks 3-4 daily.   Immunization History  Administered Date(s) Administered  . Influenza Split 08/01/2012  . Influenza Whole 10/14/2009  . Influenza, High Dose Seasonal PF 09/12/2013, 08/19/2015, 09/14/2018  . Influenza,inj,Quad PF,6+ Mos 09/24/2014  . Pneumococcal Conjugate-13 01/15/2014  . Pneumococcal Polysaccharide-23 03/31/2012  . Td 08/14/2010     Objective: Vital Signs: There were no vitals taken for this visit.   Physical Exam   Musculoskeletal Exam: ***  CDAI Exam: CDAI Score: - Patient Global: -; Provider Global: - Swollen: -; Tender: - Joint Exam   No joint exam has been documented for this visit   There is currently no information documented on the homunculus. Go to the Rheumatology activity and complete the homunculus joint exam.  Investigation: No additional findings.  Imaging: No results found.  Recent Labs: Lab Results  Component Value Date   WBC 4.9 11/16/2018   HGB 12.3 11/16/2018   PLT 260  11/16/2018   NA 139 11/16/2018   K 4.1 11/16/2018   CL 102 11/16/2018   CO2 23 11/16/2018   GLUCOSE 146 (H) 11/16/2018   BUN 9 11/16/2018   CREATININE 0.75 11/16/2018   BILITOT 0.3 11/16/2018   ALKPHOS 122 (H) 11/16/2018   AST 19 11/16/2018   ALT 16 11/16/2018   PROT 6.8 11/16/2018   ALBUMIN 4.2 11/16/2018   CALCIUM 9.8 11/16/2018   GFRAA 93 11/16/2018    Speciality Comments: No specialty comments available.  Procedures:  No procedures performed Allergies: Colcrys [colchicine], Hydrocodone, Sulindac, Tramadol, Celecoxib, and Glimepiride   Assessment / Plan:     Visit Diagnoses: No diagnosis found.  Orders: No orders of the defined types were placed in this encounter.  No orders of the defined types were placed in this encounter.   Face-to-face time spent with patient was *** minutes. Greater than 50% of time was spent in counseling and coordination of care.  Follow-Up Instructions: No follow-ups on file.   Ellen HenriMarissa C [place], CMA  Note - This record has been created using Animal nutritionistDragon software.  Chart creation errors have been sought, but may not always  have been located. Such creation errors do not reflect on  the standard of medical care.

## 2019-07-24 ENCOUNTER — Ambulatory Visit: Payer: Self-pay | Admitting: Rheumatology

## 2019-07-31 NOTE — Progress Notes (Deleted)
Office Visit Note  Patient: [name] [name]             Date of Birth: 04/30/1946           MRN: [id number]             PCP: System, Provider Not In Referring: Dorothyann PengSanders, Robyn, MD Visit Date: 08/14/2019 Occupation: @GUAROCC @  Subjective:  No chief complaint on file.  Appears she is naive to osteoporosis treatment and osteopenia is not currently being managed by any provider.  She is taking vitamin D 2000 units daily. Last DEXA on 08/08/2016 ordered by Dr. Drue NovelPaz showed T score of -2.1 at lumbar spine with -10.5% change in BMD and -8% change at femoral neck. FRAX score 4.8% major osteoporotic risk and 0.8% 10 year hip fracture risk.  Due for repeat DEXA.   History of Present Illness: [name] [name] is a 73 y.o. female ***   Activities of Daily Living:  Patient reports morning stiffness for *** {minute/hour:19697}.   Patient {ACTIONS;DENIES/REPORTS:21021675::"Denies"} nocturnal pain.  Difficulty dressing/grooming: {ACTIONS;DENIES/REPORTS:21021675::"Denies"} Difficulty climbing stairs: {ACTIONS;DENIES/REPORTS:21021675::"Denies"} Difficulty getting out of chair: {ACTIONS;DENIES/REPORTS:21021675::"Denies"} Difficulty using hands for taps, buttons, cutlery, and/or writing: {ACTIONS;DENIES/REPORTS:21021675::"Denies"}  No Rheumatology ROS completed.   PMFS History:  Patient Active Problem List   Diagnosis Date Noted  . 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 History of hypertension 12/08/2016  . History of diabetes mellitus 12/08/2016  . Dyslipidemia 12/08/2016  . Osteoarthritis of both hands 09/14/2016  . Osteoarthritis of both feet 09/14/2016  . Osteoarthritis of both knees 09/14/2016  . PCP NOTES >>> 08/19/2015  . Low back pain 09/09/2014  . Neuralgia of left saphenous nerve 06/05/2014  . Numbness and tingling in left arm 06/05/2014  . Paresthesia of left arm 05/21/2014  . Gastroparesis 02/20/2013  . Insomnia 02/20/2013  . Annual physical exam 03/31/2012  . History of  diverticulitis 04/14/2010  . Osteopenia 05/14/2009  . DYSPHAGIA UNSPECIFIED 01/10/2009  . 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 Hyperlipidemia 06/14/2007  . Essential hypertension 06/14/2007  . Osteoarthritis 06/14/2007  . DM (diabetes mellitus) ([facility]) 01/02/2007    Past Medical History:  Diagnosis Date  . Bilateral knee pain    Euflexxa x3  . Chest pain 06/2007   CP and QW on EKG: neg stress test   . Diabetes mellitus    w/ neuropathy  . Diverticulitis 2000  . Hyperlipidemia   . Hypertension   . Insomnia 02/20/2013  . Osteoarthritis    gout, CPPD--- sees Dr.Davenshwar  . Osteoarthritis of both feet 09/14/2016  . Osteoarthritis of both hands 09/14/2016  . Osteoarthritis of both knees 09/14/2016  . Osteopenia    per DEXA 4/10  . Recurrent boils    h/o MRSA    Family History  Problem Relation Age of Onset  . Hypertension Mother   . Diabetes Mother   . Hypertension Father   . Diabetes Father   . Hypertension Daughter   . Hypertension Sister   . Diabetes Sister   . Coronary artery disease Neg Hx   . Stroke Neg Hx   . Colon cancer Neg Hx   . Breast cancer Neg Hx    Past Surgical History:  Procedure Laterality Date  . CHOLECYSTECTOMY    . TONSILLECTOMY     Social History   Social History Narrative   Lives w/ husband   Patient is married with 3 children.   Patient is right handed.   Patient has college education.   Patient drinks 3-4 daily.  Immunization History  Administered Date(s) Administered  . Influenza Split 08/01/2012  . Influenza Whole 10/14/2009  . Influenza, High Dose Seasonal PF 09/12/2013, 08/19/2015, 09/14/2018  . Influenza,inj,Quad PF,6+ Mos 09/24/2014  . Pneumococcal Conjugate-13 01/15/2014  . Pneumococcal Polysaccharide-23 03/31/2012  . Td 08/14/2010     Objective: Vital Signs: There were no vitals taken for this visit.   Physical Exam   Musculoskeletal Exam: ***  CDAI Exam: CDAI Score: - Patient Global: -; Provider Global: - Swollen:  -; Tender: - Joint Exam   No joint exam has been documented for this visit   There is currently no information documented on the homunculus. Go to the Rheumatology activity and complete the homunculus joint exam.  Investigation: No additional findings.  Imaging: No results found.  Recent Labs: Lab Results  Component Value Date   WBC 4.9 11/16/2018   HGB 12.3 11/16/2018   PLT 260 11/16/2018   NA 139 11/16/2018   K 4.1 11/16/2018   CL 102 11/16/2018   CO2 23 11/16/2018   GLUCOSE 146 (H) 11/16/2018   BUN 9 11/16/2018   CREATININE 0.75 11/16/2018   BILITOT 0.3 11/16/2018   ALKPHOS 122 (H) 11/16/2018   AST 19 11/16/2018   ALT 16 11/16/2018   PROT 6.8 11/16/2018   ALBUMIN 4.2 11/16/2018   CALCIUM 9.8 11/16/2018   GFRAA 93 11/16/2018    Speciality Comments: No specialty comments available.  Procedures:  No procedures performed Allergies: Colcrys [colchicine], Hydrocodone, Sulindac, Tramadol, Celecoxib, and Glimepiride   Assessment / Plan:     Visit Diagnoses: No diagnosis found.  Orders: No orders of the defined types were placed in this encounter.  No orders of the defined types were placed in this encounter.   Face-to-face time spent with patient was *** minutes. Greater than 50% of time was spent in counseling and coordination of care.  Follow-Up Instructions: No follow-ups on file.   Earnestine Mealing, CMA  Note - This record has been created using Editor, commissioning.  Chart creation errors have been sought, but may not always  have been located. Such creation errors do not reflect on  the standard of medical care.

## 2019-08-14 ENCOUNTER — Ambulatory Visit: Payer: Medicare Other | Admitting: Rheumatology

## 2019-08-30 ENCOUNTER — Other Ambulatory Visit: Payer: Self-pay | Admitting: Internal Medicine

## 2019-09-05 ENCOUNTER — Other Ambulatory Visit: Payer: Self-pay | Admitting: Internal Medicine

## 2019-09-19 ENCOUNTER — Ambulatory Visit: Payer: Medicare Other

## 2019-09-19 ENCOUNTER — Encounter: Payer: Medicare Other | Admitting: Internal Medicine

## 2019-10-08 ENCOUNTER — Encounter: Payer: Self-pay | Admitting: Internal Medicine

## 2020-06-10 ENCOUNTER — Other Ambulatory Visit: Payer: Self-pay | Admitting: Endocrinology

## 2020-06-10 DIAGNOSIS — E1165 Type 2 diabetes mellitus with hyperglycemia: Secondary | ICD-10-CM

## 2020-06-13 ENCOUNTER — Other Ambulatory Visit: Payer: Self-pay | Admitting: Internal Medicine

## 2020-06-13 DIAGNOSIS — E2839 Other primary ovarian failure: Secondary | ICD-10-CM

## 2020-06-13 DIAGNOSIS — Z1231 Encounter for screening mammogram for malignant neoplasm of breast: Secondary | ICD-10-CM

## 2020-08-21 NOTE — Progress Notes (Deleted)
Office Visit Note  Patient: [name] [name]             Date of Birth: 01/18/1946           MRN: [id number]             PCP: System, Provider Not In Referring: No ref. provider found Visit Date: 08/22/2020 Occupation: @GUAROCC @  Subjective:  No chief complaint on file.   History of Present Illness: [name] [name] is a 74 y.o. female ***   Activities of Daily Living:  Patient reports morning stiffness for *** {minute/hour:19697}.   Patient {ACTIONS;DENIES/REPORTS:21021675::"Denies"} nocturnal pain.  Difficulty dressing/grooming: {ACTIONS;DENIES/REPORTS:21021675::"Denies"} Difficulty climbing stairs: {ACTIONS;DENIES/REPORTS:21021675::"Denies"} Difficulty getting out of chair: {ACTIONS;DENIES/REPORTS:21021675::"Denies"} Difficulty using hands for taps, buttons, cutlery, and/or writing: {ACTIONS;DENIES/REPORTS:21021675::"Denies"}  No Rheumatology ROS completed.   PMFS History:  Patient Active Problem List   Diagnosis Date Noted  . 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 History of hypertension 12/08/2016  . History of diabetes mellitus 12/08/2016  . Dyslipidemia 12/08/2016  . Osteoarthritis of both hands 09/14/2016  . Osteoarthritis of both feet 09/14/2016  . Osteoarthritis of both knees 09/14/2016  . PCP NOTES >>> 08/19/2015  . Low back pain 09/09/2014  . Neuralgia of left saphenous nerve 06/05/2014  . Numbness and tingling in left arm 06/05/2014  . Paresthesia of left arm 05/21/2014  . Gastroparesis 02/20/2013  . Insomnia 02/20/2013  . Annual physical exam 03/31/2012  . History of diverticulitis 04/14/2010  . Osteopenia 05/14/2009  . DYSPHAGIA UNSPECIFIED 01/10/2009  . 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 Hyperlipidemia 06/14/2007  . Essential hypertension 06/14/2007  . Osteoarthritis 06/14/2007  . DM (diabetes mellitus) ([facility]) 01/02/2007    Past Medical History:  Diagnosis Date  . Bilateral knee pain    Euflexxa x3  . Chest pain 06/2007   CP and QW on EKG: neg stress  test   . Diabetes mellitus    w/ neuropathy  . Diverticulitis 2000  . Hyperlipidemia   . Hypertension   . Insomnia 02/20/2013  . Osteoarthritis    gout, CPPD--- sees Dr.Davenshwar  . Osteoarthritis of both feet 09/14/2016  . Osteoarthritis of both hands 09/14/2016  . Osteoarthritis of both knees 09/14/2016  . Osteopenia    per DEXA 4/10  . Recurrent boils    h/o MRSA    Family History  Problem Relation Age of Onset  . Hypertension Mother   . Diabetes Mother   . Hypertension Father   . Diabetes Father   . Hypertension Daughter   . Hypertension Sister   . Diabetes Sister   . Coronary artery disease Neg Hx   . Stroke Neg Hx   . Colon cancer Neg Hx   . Breast cancer Neg Hx    Past Surgical History:  Procedure Laterality Date  . CHOLECYSTECTOMY    . TONSILLECTOMY     Social History   Social History Narrative   Lives w/ husband   Patient is married with 3 children.   Patient is right handed.   Patient has college education.   Patient drinks 3-4 daily.   Immunization History  Administered Date(s) Administered  . Influenza Split 08/01/2012  . Influenza Whole 10/14/2009  . Influenza, High Dose Seasonal PF 09/12/2013, 08/19/2015, 09/14/2018  . Influenza,inj,Quad PF,6+ Mos 09/24/2014  . Influenza-Unspecified 10/01/2019  . Pneumococcal Conjugate-13 01/15/2014  . Pneumococcal Polysaccharide-23 03/31/2012  . Td 08/14/2010     Objective: Vital Signs: There were no vitals taken for this visit.   Physical Exam  Musculoskeletal Exam: ***  CDAI Exam: CDAI Score: -- Patient Global: --; Provider Global: -- Swollen: --; Tender: -- Joint Exam 08/22/2020   No joint exam has been documented for this visit   There is currently no information documented on the homunculus. Go to the Rheumatology activity and complete the homunculus joint exam.  Investigation: No additional findings.  Imaging: No results found.  Recent Labs: Lab Results  Component Value Date   WBC  4.9 11/16/2018   HGB 12.3 11/16/2018   PLT 260 11/16/2018   NA 139 11/16/2018   K 4.1 11/16/2018   CL 102 11/16/2018   CO2 23 11/16/2018   GLUCOSE 146 (H) 11/16/2018   BUN 9 11/16/2018   CREATININE 0.75 11/16/2018   BILITOT 0.3 11/16/2018   ALKPHOS 122 (H) 11/16/2018   AST 19 11/16/2018   ALT 16 11/16/2018   PROT 6.8 11/16/2018   ALBUMIN 4.2 11/16/2018   CALCIUM 9.8 11/16/2018   GFRAA 93 11/16/2018    Speciality Comments: No specialty comments available.  Procedures:  No procedures performed Allergies: Colcrys [colchicine], Hydrocodone, Sulindac, Tramadol, Celecoxib, and Glimepiride   Assessment / Plan:     Visit Diagnoses: No diagnosis found.  Orders: No orders of the defined types were placed in this encounter.  No orders of the defined types were placed in this encounter.   Face-to-face time spent with patient was *** minutes. Greater than 50% of time was spent in counseling and coordination of care.  Follow-Up Instructions: No follow-ups on file.   Gearldine Bienenstock, PA-C  Note - This record has been created using Dragon software.  Chart creation errors have been sought, but may not always  have been located. Such creation errors do not reflect on  the standard of medical care.

## 2020-08-22 ENCOUNTER — Ambulatory Visit: Payer: Medicare Other | Admitting: Physician Assistant

## 2020-08-25 ENCOUNTER — Telehealth: Payer: Self-pay

## 2020-08-25 NOTE — Progress Notes (Signed)
Office Visit Note  Patient: [name] [name]             Date of Birth: 02/09/1946           MRN: [id number]             PCP: System, Provider Not In Referring: No ref. provider found Visit Date: 08/26/2020 Occupation: @GUAROCC @  Subjective:  Right shoulder joint   History of Present Illness: Suriyah Vergara Ladley is a 74 y.o. female with history of osteoarthritis and CPPD.  She presents today with pain in multiple joints including the right shoulder joint, right wrist joint, and right hand, which started about 4 days ago.  She denies any injuries or falls. She states her PCP called in a prednisone taper to the pharmacy yesterday, which has started to improve her discomfort. She continues to have warmth and swelling of the right wrist. She has noticed painful and limited ROM of the right shoulder joint. She denies any other joint pain or joint swelling at this time.    Activities of Daily Living:  Patient reports morning stiffness for 0 none.   Patient Denies nocturnal pain.  Difficulty dressing/grooming: Denies Difficulty climbing stairs: Denies Difficulty getting out of chair: Denies Difficulty using hands for taps, buttons, cutlery, and/or writing: Reports  Review of Systems  Constitutional: Negative for fatigue.  HENT: Negative for mouth sores, mouth dryness and nose dryness.   Eyes: Negative for pain, visual disturbance and dryness.  Respiratory: Negative for cough, hemoptysis, shortness of breath and difficulty breathing.   Cardiovascular: Negative for chest pain, palpitations, hypertension and swelling in legs/feet.  Gastrointestinal: Negative for blood in stool, constipation and diarrhea.  Endocrine: Negative for excessive thirst and increased urination.  Genitourinary: Negative for difficulty urinating and painful urination.  Musculoskeletal: Positive for arthralgias, joint pain and joint swelling. Negative for myalgias, muscle weakness, morning stiffness, muscle tenderness  and myalgias.  Skin: Negative for color change, pallor, rash, hair loss, nodules/bumps, skin tightness, ulcers and sensitivity to sunlight.  Allergic/Immunologic: Negative for susceptible to infections.  Neurological: Negative for dizziness, numbness, headaches and weakness.  Hematological: Negative for bruising/bleeding tendency and swollen glands.  Psychiatric/Behavioral: Negative for depressed mood and sleep disturbance. The patient is not nervous/anxious.     PMFS History:  Patient Active Problem List   Diagnosis Date Noted  . 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 History of hypertension 12/08/2016  . History of diabetes mellitus 12/08/2016  . Dyslipidemia 12/08/2016  . Osteoarthritis of both hands 09/14/2016  . Osteoarthritis of both feet 09/14/2016  . Osteoarthritis of both knees 09/14/2016  . PCP NOTES >>> 08/19/2015  . Low back pain 09/09/2014  . Neuralgia of left saphenous nerve 06/05/2014  . Numbness and tingling in left arm 06/05/2014  . Paresthesia of left arm 05/21/2014  . Gastroparesis 02/20/2013  . Insomnia 02/20/2013  . Annual physical exam 03/31/2012  . History of diverticulitis 04/14/2010  . Osteopenia 05/14/2009  . DYSPHAGIA UNSPECIFIED 01/10/2009  . 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 Hyperlipidemia 06/14/2007  . Essential hypertension 06/14/2007  . Osteoarthritis 06/14/2007  . DM (diabetes mellitus) (HCC) 01/02/2007    Past Medical History:  Diagnosis Date  . Bilateral knee pain    Euflexxa x3  . Chest pain 06/2007   CP and QW on EKG: neg stress test   . Diabetes mellitus    w/ neuropathy  . Diverticulitis 2000  . Gout   . Hyperlipidemia   . Hypertension   . Insomnia 02/20/2013  .  Osteoarthritis    gout, CPPD--- sees Dr.Davenshwar  . Osteoarthritis of both feet 09/14/2016  . Osteoarthritis of both hands 09/14/2016  . Osteoarthritis of both knees 09/14/2016  . Osteopenia    per DEXA 4/10  . Recurrent boils    h/o MRSA    Family History  Problem  Relation Age of Onset  . Hypertension Mother   . Diabetes Mother   . Hypertension Father   . Diabetes Father   . Hypertension Daughter   . Hypertension Sister   . Diabetes Sister   . Coronary artery disease Neg Hx   . Stroke Neg Hx   . Colon cancer Neg Hx   . Breast cancer Neg Hx    Past Surgical History:  Procedure Laterality Date  . CHOLECYSTECTOMY    . TONSILLECTOMY     Social History   Social History Narrative   Lives w/ husband   Patient is married with 3 children.   Patient is right handed.   Patient has college education.   Patient drinks 3-4 daily.   Immunization History  Administered Date(s) Administered  . Influenza Split 08/01/2012  . Influenza Whole 10/14/2009  . Influenza, High Dose Seasonal PF 09/12/2013, 08/19/2015, 09/14/2018  . Influenza,inj,Quad PF,6+ Mos 09/24/2014  . Influenza-Unspecified 10/01/2019  . PFIZER SARS-COV-2 Vaccination 12/11/2019, 01/01/2020  . Pneumococcal Conjugate-13 01/15/2014  . Pneumococcal Polysaccharide-23 03/31/2012  . Td 08/14/2010     Objective: Vital Signs: BP (!) 143/73 (BP Location: Left Arm, Patient Position: Sitting, Cuff Size: Normal)   Pulse 89   Resp 16   Ht 5' (1.524 m)   Wt 134 lb 9.6 oz (61.1 kg)   BMI 26.29 kg/m    Physical Exam Vitals and nursing note reviewed.  Constitutional:      Appearance: She is well-developed.  HENT:     Head: Normocephalic and atraumatic.  Eyes:     Conjunctiva/sclera: Conjunctivae normal.  Pulmonary:     Effort: Pulmonary effort is normal.  Abdominal:     Palpations: Abdomen is soft.  Musculoskeletal:     Cervical back: Normal range of motion.  Skin:    General: Skin is warm and dry.     Capillary Refill: Capillary refill takes less than 2 seconds.  Neurological:     Mental Status: She is alert and oriented to person, place, and time.  Psychiatric:        Behavior: Behavior normal.      Musculoskeletal Exam: C-spine good ROM.  Right shoulder severe pain with  abduction to about 30 degrees.  Tenderness of the right shoulder joint upon palpation.  Left shoulder abduction to about 120 degrees.  Elbow joints good ROM with no tenderness or inflammation.  Tenderness and inflammation of the right wrist joint.  Tenderness and synovitis of the right 2nd and 3rd MCP and PIP joints.  Incomplete right fist formation.  Warmth of both knee joints noted.  Ankle joints good ROM with no tenderness or inflammation.   CDAI Exam: CDAI Score: -- Patient Global: --; Provider Global: -- Swollen: 6 ; Tender: 6  Joint Exam 08/26/2020      Right  Left  Acromioclavicular   Tender     Glenohumeral   Tender     Wrist  Swollen Tender     IP  Swollen Tender     PIP 2  Swollen Tender     PIP 3  Swollen Tender     Knee  Swollen   Swollen  Investigation: No additional findings.  Imaging: XR Hand 2 View Right  Result Date: 08/26/2020 Severe PIP and DIP narrowing with most severe narrowing in the first and third PIP joint was noted.  Cystic changes are noted in the fifth metacarpal.  No intercarpal or radiocarpal joint space narrowing was noted.  Chondrocalcinosis was noted in the wrist joint. Impression: These findings are consistent with osteoarthritis and pseudogout.  XR Shoulder Right  Result Date: 08/26/2020 No glenohumeral joint space narrowing was noted.  Calcification was noted at the superior aspect of humeral head consistent with chondrocalcinosis.  Inferior spurring was also noted at the inferior part of the humeral head.  Calcification around the acromioclavicular joint was noted.  Mild acromioclavicular arthritis was noted. Impression: These findings are consistent with pseudogout.  Chondrocalcinosis was noted in the glenohumeral and the acromioclavicular joint.   Recent Labs: Lab Results  Component Value Date   WBC 4.9 11/16/2018   HGB 12.3 11/16/2018   PLT 260 11/16/2018   NA 139 11/16/2018   K 4.1 11/16/2018   CL 102 11/16/2018   CO2 23  11/16/2018   GLUCOSE 146 (H) 11/16/2018   BUN 9 11/16/2018   CREATININE 0.75 11/16/2018   BILITOT 0.3 11/16/2018   ALKPHOS 122 (H) 11/16/2018   AST 19 11/16/2018   ALT 16 11/16/2018   PROT 6.8 11/16/2018   ALBUMIN 4.2 11/16/2018   CALCIUM 9.8 11/16/2018   GFRAA 93 11/16/2018    Speciality Comments: No specialty comments available.  Procedures:  No procedures performed Allergies: Colchicine, Hydrocodone, Sulindac, Tramadol, Celecoxib, and Glimepiride   Assessment / Plan:     Visit Diagnoses: Primary osteoarthritis of both hands: She has PIP and DIP thickening consistent with osteoarthritis.  She has tenderness and inflammation of the right 1st, 2nd, and 3rd PIP joints.  Incomplete right fist formation.  X-rays of the right hand were updated today. She continues to take tumeric, omega 3, and tart cherry daily.   History of calcium pyrophosphate deposition disease (CPPD): She has a history of recurrent pseudogout flares in both knee joints. X-rays of both knee joints revealed moderate to severe OA with chondrocalcinosis and  Severe chondromalacia patella. Her most recent left knee joint cortisone injection was performed on 01/19/19.  She has ongoing warmth in both knee joints on exam.  She presents today with severe pain in the right shoulder joints, right wrist joint, and right hand.  She has warmth, swelling, and tenderness of the right wrist and 1st, 2nd, and 3rd PIP joints. X-rays of the right shoulder and right hand were updated today.  She has chondrocalcinosis in the right shoulder joint and AC joint as well as the right wrist joint.  She was encouraged to complete the prednisone taper prescribed by her PCP. We discussed different treatment options today for management of pseudogout.  The plan is to start her on Colchicine 0.6 mg 1 tablet by mouth daily.  Prescription pending lab results today.  She was advised to notify us if she cannot tolerate taking colchicine.   I reviewed the plan  with Dr. Corliss Skainseveshwar.  She will follow up in the office in 6 weeks to assess her response to colchicine.   Pain in right hand - X-ray findings of the right hand were consistent with osteoarthritis and pseudogout.  She has severe PIP and DIP joint space narrowing. Chondrocalcinosis was noted in the right wrist joint.  She has tenderness and inflammation on the dorsal aspect of the right wrist. Tenderness and  inflammation of the right 1st, 2nd, and 3rd PIP joints noted on exam today.  We will obtain the following lab work today.  The plan is to start her on colchicine pending lab results.  Indications, contraindications, and potential side effects of colchicine were discussed today and all questions were addressed.  Plan: XR Hand 2 View Right, Uric acid, Sedimentation rate, Cyclic citrul peptide antibody, IgG, 14-3-3 eta Protein, Rheumatoid factor  Acute pain of right shoulder - She presents today with severe pain and limited ROM of the right shoulder joint.  She has tenderness to palpation over the right AC joint.  X-rays of the right shoulder joint were updated today. We will also obtain the following lab work today. She was advised to complete the prednisone taper prescribed by her PCP.  The plan is to start her on colchicine pending lab results. Plan: XR Shoulder Right, Uric acid, Sedimentation rate, Cyclic citrul peptide antibody, IgG, 14-3-3 eta Protein, Rheumatoid factor  Medication monitoring encounter - CBC and CMP were drawn today. Prescription for colchicine pending lab results. Plan: CBC with Differential/Platelet, COMPLETE METABOLIC PANEL WITH GFR  Primary osteoarthritis of both knees: She has good ROM of both knee joints.  Warmth of both knee joints noted. X-rays of both knee joints were updated on 01/10/18. She had a left knee joint cortisone injection performed on 01/19/19.   Primary osteoarthritis of both feet: She is not experiencing any discomfort in her feet at this time.   Osteopenia of  multiple sites: She is scheduled to update DEXA on 09/17/20. She continues to take vitamin D 2,000 units daily.    Trochanteric bursitis of both hips: Resolved.   Other medical conditions are listed as follows:   History of hyperlipidemia  History of diabetes mellitus  History of diverticulitis  History of hypertension  History of insomnia    Orders: Orders Placed This Encounter  Procedures  . XR Shoulder Right  . XR Hand 2 View Right  . CBC with Differential/Platelet  . COMPLETE METABOLIC PANEL WITH GFR  . Uric acid  . Sedimentation rate  . Cyclic citrul peptide antibody, IgG  . 14-3-3 eta Protein  . Rheumatoid factor   No orders of the defined types were placed in this encounter.     Follow-Up Instructions: Return in about 6 weeks (around 10/07/2020) for Osteoarthritis, CPPD.   Gearldine Bienenstock, PA-C  Note - This record has been created using Dragon software.  Chart creation errors have been sought, but may not always  have been located. Such creation errors do not reflect on  the standard of medical care.

## 2020-08-25 NOTE — Telephone Encounter (Signed)
Patient is having pain in her right hand. Patient is having swelling from her shoulder to her hand. Patient has tried biofreeze patch, ibuprofen, elevation and ice. Patient's daughter gave her tart cherry juice as well. Patient's daughter states she had contacted the PCP and she will be prescribing her some prednisone and her prescription for Metformin. Patient will be in the office tomorrow 08/26/2020 for evaluation.

## 2020-08-25 NOTE — Telephone Encounter (Signed)
Ok we will further evaluate and discuss treatment options tomorrow.

## 2020-08-25 NOTE — Telephone Encounter (Signed)
Patient's daughter called stating her mom is in a lot of pain and rescheduled her appointment for tomorrow 08/26/20.  [name] [name] is asking if a prescription of Prednisone could be sent to the pharmacy to help with the pain.

## 2020-08-26 ENCOUNTER — Ambulatory Visit: Payer: Self-pay

## 2020-08-26 ENCOUNTER — Other Ambulatory Visit: Payer: Self-pay

## 2020-08-26 ENCOUNTER — Encounter: Payer: Self-pay | Admitting: Physician Assistant

## 2020-08-26 ENCOUNTER — Ambulatory Visit (INDEPENDENT_AMBULATORY_CARE_PROVIDER_SITE_OTHER): Payer: Medicare Other | Admitting: Physician Assistant

## 2020-08-26 VITALS — BP 143/73 | HR 89 | Resp 16 | Ht 60.0 in | Wt 134.6 lb

## 2020-08-26 DIAGNOSIS — Z8739 Personal history of other diseases of the musculoskeletal system and connective tissue: Secondary | ICD-10-CM

## 2020-08-26 DIAGNOSIS — M170 Bilateral primary osteoarthritis of knee: Secondary | ICD-10-CM

## 2020-08-26 DIAGNOSIS — Z5181 Encounter for therapeutic drug level monitoring: Secondary | ICD-10-CM

## 2020-08-26 DIAGNOSIS — M25511 Pain in right shoulder: Secondary | ICD-10-CM

## 2020-08-26 DIAGNOSIS — Z8639 Personal history of other endocrine, nutritional and metabolic disease: Secondary | ICD-10-CM

## 2020-08-26 DIAGNOSIS — M19072 Primary osteoarthritis, left ankle and foot: Secondary | ICD-10-CM

## 2020-08-26 DIAGNOSIS — M19071 Primary osteoarthritis, right ankle and foot: Secondary | ICD-10-CM | POA: Diagnosis not present

## 2020-08-26 DIAGNOSIS — Z8679 Personal history of other diseases of the circulatory system: Secondary | ICD-10-CM

## 2020-08-26 DIAGNOSIS — M79641 Pain in right hand: Secondary | ICD-10-CM

## 2020-08-26 DIAGNOSIS — M7061 Trochanteric bursitis, right hip: Secondary | ICD-10-CM

## 2020-08-26 DIAGNOSIS — Z87898 Personal history of other specified conditions: Secondary | ICD-10-CM

## 2020-08-26 DIAGNOSIS — M8589 Other specified disorders of bone density and structure, multiple sites: Secondary | ICD-10-CM

## 2020-08-26 DIAGNOSIS — M19041 Primary osteoarthritis, right hand: Secondary | ICD-10-CM

## 2020-08-26 DIAGNOSIS — Z8719 Personal history of other diseases of the digestive system: Secondary | ICD-10-CM

## 2020-08-26 DIAGNOSIS — M19042 Primary osteoarthritis, left hand: Secondary | ICD-10-CM

## 2020-08-26 DIAGNOSIS — M7062 Trochanteric bursitis, left hip: Secondary | ICD-10-CM

## 2020-08-27 ENCOUNTER — Emergency Department (HOSPITAL_COMMUNITY)
Admission: EM | Admit: 2020-08-27 | Discharge: 2020-08-27 | Disposition: A | Discharge status: Home / Self Care | Payer: Medicare Other | Attending: Emergency Medicine | Admitting: Emergency Medicine

## 2020-08-27 ENCOUNTER — Other Ambulatory Visit: Payer: Self-pay

## 2020-08-27 ENCOUNTER — Encounter (HOSPITAL_COMMUNITY): Payer: Self-pay

## 2020-08-27 DIAGNOSIS — I10 Essential (primary) hypertension: Secondary | ICD-10-CM | POA: Diagnosis not present

## 2020-08-27 DIAGNOSIS — Z7982 Long term (current) use of aspirin: Secondary | ICD-10-CM | POA: Insufficient documentation

## 2020-08-27 DIAGNOSIS — R739 Hyperglycemia, unspecified: Secondary | ICD-10-CM

## 2020-08-27 DIAGNOSIS — E1165 Type 2 diabetes mellitus with hyperglycemia: Secondary | ICD-10-CM | POA: Diagnosis present

## 2020-08-27 LAB — CBG MONITORING, ED
Glucose-Capillary: 501 mg/dL (ref 70–99)
Glucose-Capillary: 320 mg/dL — ABNORMAL HIGH (ref 70–99)
Glucose-Capillary: 270 mg/dL — ABNORMAL HIGH (ref 70–99)

## 2020-08-27 LAB — CBC
HCT: 32.6 % — ABNORMAL LOW (ref 36.0–46.0)
Hemoglobin: 11.3 g/dL — ABNORMAL LOW (ref 12.0–15.0)
MCH: 29.7 pg (ref 26.0–34.0)
MCHC: 34.7 g/dL (ref 30.0–36.0)
MCV: 85.6 fL (ref 80.0–100.0)
Platelets: 372 10*3/uL (ref 150–400)
RBC: 3.81 MIL/uL — ABNORMAL LOW (ref 3.87–5.11)
RDW: 13.4 % (ref 11.5–15.5)
WBC: 9.9 10*3/uL (ref 4.0–10.5)
nRBC: 0 % (ref 0.0–0.2)

## 2020-08-27 LAB — BLOOD GAS, VENOUS
Acid-Base Excess: 3.5 mmol/L — ABNORMAL HIGH (ref 0.0–2.0)
Bicarbonate: 27 mmol/L (ref 20.0–28.0)
O2 Saturation: 99.2 %
Patient temperature: 98.6
pCO2, Ven: 38.7 mmHg — ABNORMAL LOW (ref 44.0–60.0)
pH, Ven: 7.459 — ABNORMAL HIGH (ref 7.250–7.430)
pO2, Ven: 133 mmHg — ABNORMAL HIGH (ref 32.0–45.0)

## 2020-08-27 LAB — URINALYSIS, ROUTINE W REFLEX MICROSCOPIC
Bilirubin Urine: NEGATIVE
Glucose, UA: >=500 mg/dL — AB
Hgb urine dipstick: NEGATIVE
Ketones, ur: NEGATIVE mg/dL
Leukocytes,Ua: NEGATIVE
Nitrite: NEGATIVE
Protein, ur: NEGATIVE mg/dL
Specific Gravity, Urine: 1.033 — ABNORMAL HIGH (ref 1.005–1.030)
pH: 5 (ref 5.0–8.0)

## 2020-08-27 LAB — BASIC METABOLIC PANEL
Anion gap: 17 — ABNORMAL HIGH (ref 5–15)
BUN: 10 mg/dL (ref 8–23)
CO2: 25 mmol/L (ref 22–32)
Calcium: 9.9 mg/dL (ref 8.9–10.3)
Chloride: 95 mmol/L — ABNORMAL LOW (ref 98–111)
Creatinine, Ser: 0.96 mg/dL (ref 0.44–1.00)
GFR, Estimated: 59 mL/min — ABNORMAL LOW (ref >60)
Glucose, Bld: 529 mg/dL (ref 70–99)
Potassium: 3.1 mmol/L — ABNORMAL LOW (ref 3.5–5.1)
Sodium: 137 mmol/L (ref 135–145)

## 2020-08-27 MED ORDER — SODIUM CHLORIDE 0.9 % IV BOLUS
1000.0000 mL | Freq: Once | INTRAVENOUS | Status: AC
  Administered 2020-08-27: 1000 mL via INTRAVENOUS

## 2020-08-27 MED ORDER — POTASSIUM CHLORIDE CRYS ER 20 MEQ PO TBCR
40.0000 meq | EXTENDED_RELEASE_TABLET | Freq: Once | ORAL | Status: AC
  Administered 2020-08-27: 40 meq via ORAL
  Filled 2020-08-27: qty 2

## 2020-08-27 NOTE — Telephone Encounter (Signed)
Pending lab results, patient will be starting colchicine per Sherron Ales, PA-C.

## 2020-08-27 NOTE — Progress Notes (Signed)
Glucose is extremely elevated-610. She was started on  prednisone taper by her PCP 2 days ago. Please notify the patient immediately. Please ask if she has checked her glucose today.   She should be evaluated in the emergency department urgently.   ESR is elevated-45. RF negative. Uric acid is WNL.

## 2020-08-27 NOTE — ED Notes (Signed)
Date and time results received: 08/27/20 1145 (use smartphrase ".now" to insert current time)  Test: blood glucose  Critical Value: 529  Name of Provider Notified: Pecola Leisure   Orders Received? Or Actions Taken?: no orders given

## 2020-08-27 NOTE — ED Provider Notes (Signed)
[facility]-EMERGENCY DEPT Provider Note   CSN: [id number] Arrival date & time: 08/27/20  1916     History Chief Complaint  Patient presents with  . Hyperglycemia    [name] [name] is a 74 y.o. female.  The history is provided by the patient.  Hyperglycemia  [name] [name] is a 74 y.o. female who presents to the Emergency Department complaining of hyperglycemia. She presents the emergency department accompanied by her daughter for evaluation of elevated blood glucose on outpatient lab that was performed yesterday. She had a blood glucose of 610 and was told to go to the emergency department for evaluation. Patient states she has no complaints. She ate cereal this morning. She does have a history of diabetes and has taken her medication as prescribed. She denies any fevers, chest pain, abdominal pain, nausea, vomiting, weakness. She was started on prednisone on Monday for gout flare in her right hand and wrist. She states that the swelling and pain is significantly improved at this point.    Past Medical History:  Diagnosis Date  . Bilateral knee pain    Euflexxa x3  . Chest pain 06/2007   CP and QW on EKG: neg stress test   . Diabetes mellitus    w/ neuropathy  . Diverticulitis 2000  . Gout   . Hyperlipidemia   . Hypertension   . Insomnia 02/20/2013  . Osteoarthritis    gout, CPPD--- sees Dr.Davenshwar  . Osteoarthritis of both feet 09/14/2016  . Osteoarthritis of both hands 09/14/2016  . Osteoarthritis of both knees 09/14/2016  . Osteopenia    per DEXA 4/10  . Recurrent boils    h/o MRSA    Patient Active Problem List   Diagnosis Date Noted  . 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 History of hypertension 12/08/2016  . History of diabetes mellitus 12/08/2016  . Dyslipidemia 12/08/2016  . Osteoarthritis of both hands 09/14/2016  . Osteoarthritis of both feet 09/14/2016  . Osteoarthritis of both knees 09/14/2016  . PCP NOTES >>> 08/19/2015  . Low  back pain 09/09/2014  . Neuralgia of left saphenous nerve 06/05/2014  . Numbness and tingling in left arm 06/05/2014  . Paresthesia of left arm 05/21/2014  . Gastroparesis 02/20/2013  . Insomnia 02/20/2013  . Annual physical exam 03/31/2012  . History of diverticulitis 04/14/2010  . Osteopenia 05/14/2009  . DYSPHAGIA UNSPECIFIED 01/10/2009  . 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 Hyperlipidemia 06/14/2007  . Essential hypertension 06/14/2007  . Osteoarthritis 06/14/2007  . DM (diabetes mellitus) (HCC) 01/02/2007    Past Surgical History:  Procedure Laterality Date  . CHOLECYSTECTOMY    . TONSILLECTOMY       OB History   No obstetric history on file.     Family History  Problem Relation Age of Onset  . Hypertension Mother   . Diabetes Mother   . Hypertension Father   . Diabetes Father   . Hypertension Daughter   . Hypertension Sister   . Diabetes Sister   . Coronary artery disease Neg Hx   . Stroke Neg Hx   . Colon cancer Neg Hx   . Breast cancer Neg Hx     Social History   Tobacco Use  . Smoking status: Never Smoker  . Smokeless tobacco: Never Used  Vaping Use  . Vaping Use: Never used  Substance Use Topics  . Alcohol use: No  . Drug use: No    Home Medications Prior to Admission medications  Medication Sig Start Date End Date Taking? Authorizing Provider  aspirin 81 MG tablet Take 81 mg by mouth daily.     Yes [provider]  Cholecalciferol (VITAMIN D) 2000 UNITS CAPS Take 2,000 Units by mouth daily. Reported on 11/18/2015   Yes [provider]  diltiazem (TIAZAC) 300 MG 24 hr capsule TAKE 1 CAPSULE BY MOUTH EVERY DAY Patient taking differently: Take 300 mg by mouth daily.  01/17/19  Yes Dorothyann Peng, MD  predniSONE (STERAPRED UNI-PAK 21 TAB) 10 MG (21) TBPK tablet Take by mouth. 08/25/20  Yes [provider]  SitaGLIPtin-MetFORMIN HCl (JANUMET XR) 50-500 MG TB24 Take 2 tablets by mouth daily. 10/17/18  Yes Rodriguez-Southworth,  Nettie Elm, PA-C  trolamine salicylate (ASPERCREME) 10 % cream Apply 1 application topically as needed for muscle pain.   Yes [provider]  fluticasone (FLONASE) 50 MCG/ACT nasal spray Place 2 sprays into both nostrils daily. Patient not taking: Reported on 08/27/2020 11/27/15   Wanda Plump, MD  glucose blood [facility] VERIO) test strip USE TO TEST BLOOD SUGAR 3 TIMES DAILY 12/05/18   Romero Belling, MD  pravastatin (PRAVACHOL) 20 MG tablet TAKE 1 TABLET BY MOUTH EVERY DAY Patient not taking: Reported on 08/27/2020 10/31/18   Dorothyann Peng, MD    Allergies    Colchicine, Hydrocodone, Sulindac, Tramadol, Celecoxib, and Glimepiride  Review of Systems   Review of Systems  All other systems reviewed and are negative.   Physical Exam Updated Vital Signs BP 138/73   Pulse 75   Temp (!) 97.5 F (36.4 C) (Oral)   Resp 17   SpO2 96%   Physical Exam Vitals and nursing note reviewed.  Constitutional:      Appearance: She is well-developed.  HENT:     Head: Normocephalic and atraumatic.  Cardiovascular:     Rate and Rhythm: Normal rate and regular rhythm.     Heart sounds: No murmur heard.   Pulmonary:     Effort: Pulmonary effort is normal. No respiratory distress.     Breath sounds: Normal breath sounds.  Abdominal:     Palpations: Abdomen is soft.     Tenderness: There is no abdominal tenderness. There is no guarding or rebound.  Musculoskeletal:        General: No swelling or tenderness.  Skin:    General: Skin is warm and dry.  Neurological:     Mental Status: She is alert and oriented to person, place, and time.  Psychiatric:        Behavior: Behavior normal.     ED Results / Procedures / Treatments   Labs (all labs ordered are listed, but only abnormal results are displayed) Labs Reviewed  BASIC METABOLIC PANEL - Abnormal; Notable for the following components:      Result Value   Potassium 3.1 (*)    Chloride 95 (*)    Glucose, Bld 529 (*)    GFR,  Estimated 59 (*)    Anion gap 17 (*)    All other components within normal limits  CBC - Abnormal; Notable for the following components:   RBC 3.81 (*)    Hemoglobin 11.3 (*)    HCT 32.6 (*)    All other components within normal limits  URINALYSIS, ROUTINE W REFLEX MICROSCOPIC - Abnormal; Notable for the following components:   Color, Urine STRAW (*)    Specific Gravity, Urine 1.033 (*)    Glucose, UA >=500 (*)    Bacteria, UA RARE (*)  All other components within normal limits  BLOOD GAS, VENOUS - Abnormal; Notable for the following components:   pH, Ven 7.459 (*)    pCO2, Ven 38.7 (*)    pO2, Ven 133.0 (*)    Acid-Base Excess 3.5 (*)    All other components within normal limits  CBG MONITORING, ED - Abnormal; Notable for the following components:   Glucose-Capillary 501 (*)    All other components within normal limits  CBG MONITORING, ED - Abnormal; Notable for the following components:   Glucose-Capillary 320 (*)    All other components within normal limits  CBG MONITORING, ED - Abnormal; Notable for the following components:   Glucose-Capillary 270 (*)    All other components within normal limits  CBG MONITORING, ED    EKG None  Radiology XR Hand 2 View Right  Result Date: 08/26/2020 Severe PIP and DIP narrowing with most severe narrowing in the first and third PIP joint was noted.  Cystic changes are noted in the fifth metacarpal.  No intercarpal or radiocarpal joint space narrowing was noted.  Chondrocalcinosis was noted in the wrist joint. Impression: These findings are consistent with osteoarthritis and pseudogout.  XR Shoulder Right  Result Date: 08/26/2020 No glenohumeral joint space narrowing was noted.  Calcification was noted at the superior aspect of humeral head consistent with chondrocalcinosis.  Inferior spurring was also noted at the inferior part of the humeral head.  Calcification around the acromioclavicular joint was noted.  Mild acromioclavicular  arthritis was noted. Impression: These findings are consistent with pseudogout.  Chondrocalcinosis was noted in the glenohumeral and the acromioclavicular joint.   Procedures Procedures (including critical care time)  Medications Ordered in ED Medications  sodium chloride 0.9 % bolus 1,000 mL (0 mLs Intravenous Stopped 08/27/20 1400)  sodium chloride 0.9 % bolus 1,000 mL (0 mLs Intravenous Stopped 08/27/20 1437)  potassium chloride SA (KLOR-CON) CR tablet 40 mEq (40 mEq Oral Given 08/27/20 1247)    ED Course  I have reviewed the triage vital signs and the nursing notes.  Pertinent labs & imaging results that were available during my care of the patient were reviewed by me and considered in my medical decision making (see chart for details).    MDM Rules/Calculators/A&P                         patient with history of diabetes here for evaluation of hyperglycemia after starting prednisone earlier this week for gout flare. She has no systemic symptoms. Labs with hyperglycemia but no evidence of DKA. She was treated with IV fluid hydration and her blood sugars resolved. No evidence of acute infectious process. Her gout flare appears to be resolved, recommend discontinuing steroids. Discussed outpatient follow-up and return precautions.  Final Clinical Impression(s) / ED Diagnoses Final diagnoses:  Hyperglycemia    Rx / DC Orders ED Discharge Orders    None       Tilden Fossa, MD 08/27/20 1544

## 2020-08-27 NOTE — Telephone Encounter (Signed)
Lab resulted except the 14-3-3 eta. Glucose is extremely elevated-610. ESR is elevated-45. RF negative. Uric acid is WNL.   Per office note on 08/26/2020: Colchicine 0.6 mg 1 tablet by mouth daily.

## 2020-08-27 NOTE — ED Triage Notes (Signed)
Pt sent by PCP for hyperglycemia. Over 600 BGL according to lab test yesterday. 503 on arrival today. Pt has no complaints

## 2020-08-28 NOTE — Telephone Encounter (Signed)
Please call the patient to see how she is doing after being evaluated in the ED for severely elevated blood glucose.   Also clarify if she would like [place] to send in the prescription for colchicine.  Colchicine will help to manage her symptoms due to pseudogout.  It appears that she may have tried colchicine in the past and it caused nausea.  Please clarify if she could tolerate colchicine or if she is willing to try colchicine again.

## 2020-08-28 NOTE — Telephone Encounter (Signed)
Attempted to contact the patient and unable to leave a message as mailbox is full.  

## 2020-09-06 LAB — COMPLETE METABOLIC PANEL WITH GFR
AG Ratio: 1.4 (calc) (ref 1.0–2.5)
ALT: 7 U/L (ref 6–29)
AST: 8 U/L — ABNORMAL LOW (ref 10–35)
Albumin: 4.1 g/dL (ref 3.6–5.1)
Alkaline phosphatase (APISO): 106 U/L (ref 37–153)
BUN: 11 mg/dL (ref 7–25)
CO2: 28 mmol/L (ref 20–32)
Calcium: 10.3 mg/dL (ref 8.6–10.4)
Chloride: 95 mmol/L — ABNORMAL LOW (ref 98–110)
Creat: 0.88 mg/dL (ref 0.60–0.93)
GFR, Est African American: 76 mL/min/{1.73_m2} (ref >=60)
GFR, Est Non African American: 65 mL/min/{1.73_m2} (ref >=60)
Globulin: 2.9 g/dL (calc) (ref 1.9–3.7)
Glucose, Bld: 610 mg/dL (ref 65–99)
Potassium: 3.6 mmol/L (ref 3.5–5.3)
Sodium: 135 mmol/L (ref 135–146)
Total Bilirubin: 0.4 mg/dL (ref 0.2–1.2)
Total Protein: 7 g/dL (ref 6.1–8.1)

## 2020-09-06 LAB — CBC WITH DIFFERENTIAL/PLATELET
Absolute Monocytes: 101 cells/uL — ABNORMAL LOW (ref 200–950)
Basophils Absolute: 20 cells/uL (ref 0–200)
Basophils Relative: 0.3 %
Eosinophils Absolute: 0 cells/uL — ABNORMAL LOW (ref 15–500)
Eosinophils Relative: 0 %
HCT: 37.1 % (ref 35.0–45.0)
Hemoglobin: 12.2 g/dL (ref 11.7–15.5)
Lymphs Abs: 757 cells/uL — ABNORMAL LOW (ref 850–3900)
MCH: 29.8 pg (ref 27.0–33.0)
MCHC: 32.9 g/dL (ref 32.0–36.0)
MCV: 90.5 fL (ref 80.0–100.0)
MPV: 11.2 fL (ref 7.5–12.5)
Monocytes Relative: 1.5 %
Neutro Abs: 5822 cells/uL (ref 1500–7800)
Neutrophils Relative %: 86.9 %
Platelets: 365 10*3/uL (ref 140–400)
RBC: 4.1 10*6/uL (ref 3.80–5.10)
RDW: 12.5 % (ref 11.0–15.0)
Total Lymphocyte: 11.3 %
WBC: 6.7 10*3/uL (ref 3.8–10.8)

## 2020-09-06 LAB — URIC ACID: Uric Acid, Serum: 4.5 mg/dL (ref 2.5–7.0)

## 2020-09-06 LAB — RHEUMATOID FACTOR: Rheumatoid fact SerPl-aCnc: <14 IU/mL (ref <14)

## 2020-09-06 LAB — SEDIMENTATION RATE: Sed Rate: 45 mm/h — ABNORMAL HIGH (ref 0–30)

## 2020-09-06 LAB — 14-3-3 ETA PROTEIN: 14-3-3 eta Protein: <0.2 ng/mL (ref <0.2)

## 2020-09-06 LAB — CYCLIC CITRUL PEPTIDE ANTIBODY, IGG: Cyclic Citrullin Peptide Ab: <16 UNITS

## 2020-09-08 NOTE — Progress Notes (Signed)
Absolute lymphocytes, monocytes, and eosinophils are low.  WBC count is WNL. Please notify the patient and forward results to PCP.   Anti-CCP and 14-3-3 eta negative.

## 2020-09-17 ENCOUNTER — Other Ambulatory Visit: Payer: Medicare Other

## 2020-09-17 ENCOUNTER — Ambulatory Visit: Payer: Medicare Other

## 2020-09-30 NOTE — Progress Notes (Signed)
Office Visit Note  Patient: [name] [name] Clay             Date of Birth: 10/14/1946           MRN: [id number]             PCP: Andi DevonShelton, Kimberly, MD Referring: Andi DevonShelton, Kimberly, MD Visit Date: 10/14/2020 Occupation: @GUAROCC @  Subjective:  Pain in multiple joints   History of Present Illness: [name] [name] [place] is a 74 y.o. female with history of osteoarthritis and CPPD.  She presents today with ongoing pain in the right shoulder joint, right wrist joint, and right hand.  She has noticed swelling intermittently in both hands.  She has been taking ibuprofen 400 mg BID for the past 5 days.  She tolerated ibuprofen without any side effects.  She noticed some improvement in her joint pain, stiffness, and swelling while taking ibuprofen.  In the past she could not tolerate taking colchicine due to experiencing nausea.  She also tried Celebrex in the past but developed a rash.  She is not a good candidate for oral prednisone or cortisone injections due to her history of uncontrolled diabetes.    Activities of Daily Living:  Patient reports joint stiffness all day   Patient reports nocturnal pain.  Difficulty dressing/grooming: Reports  Difficulty climbing stairs: Denies Difficulty getting out of chair: Denies Difficulty using hands for taps, buttons, cutlery, and/or writing: reports   Review of Systems  Constitutional: Positive for fatigue.  HENT: Positive for mouth dryness. Negative for mouth sores and nose dryness.   Eyes: Negative for pain, itching and dryness.  Respiratory: Negative for shortness of breath and difficulty breathing.   Cardiovascular: Negative for chest pain and palpitations.  Gastrointestinal: Negative for blood in stool, constipation and diarrhea.  Endocrine: Negative for increased urination.  Genitourinary: Negative for difficulty urinating.  Musculoskeletal: Positive for arthralgias, joint pain and joint swelling. Negative for myalgias, morning stiffness, muscle  tenderness and myalgias.  Skin: Negative for color change, rash and redness.  Allergic/Immunologic: Negative for susceptible to infections.  Neurological: Negative for dizziness, numbness, headaches, memory loss and weakness.  Hematological: Negative for bruising/bleeding tendency.  Psychiatric/Behavioral: Negative for confusion.    PMFS History:  Patient Active Problem List   Diagnosis Date Noted  . 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 History of hypertension 12/08/2016  . History of diabetes mellitus 12/08/2016  . Dyslipidemia 12/08/2016  . Osteoarthritis of both hands 09/14/2016  . Osteoarthritis of both feet 09/14/2016  . Osteoarthritis of both knees 09/14/2016  . PCP NOTES >>> 08/19/2015  . Low back pain 09/09/2014  . Neuralgia of left saphenous nerve 06/05/2014  . Numbness and tingling in left arm 06/05/2014  . Paresthesia of left arm 05/21/2014  . Gastroparesis 02/20/2013  . Insomnia 02/20/2013  . Annual physical exam 03/31/2012  . History of diverticulitis 04/14/2010  . Osteopenia 05/14/2009  . DYSPHAGIA UNSPECIFIED 01/10/2009  . 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 Hyperlipidemia 06/14/2007  . Essential hypertension 06/14/2007  . Osteoarthritis 06/14/2007  . DM (diabetes mellitus) (HCC) 01/02/2007    Past Medical History:  Diagnosis Date  . Bilateral knee pain    Euflexxa x3  . Chest pain 06/2007   CP and QW on EKG: neg stress test   . Diabetes mellitus    w/ neuropathy  . Diverticulitis 2000  . Gout   . Hyperlipidemia   . Hypertension   . Insomnia 02/20/2013  . Osteoarthritis    gout, CPPD--- sees Dr.Davenshwar  .  Osteoarthritis of both feet 09/14/2016  . Osteoarthritis of both hands 09/14/2016  . Osteoarthritis of both knees 09/14/2016  . Osteopenia    per DEXA 4/10  . Recurrent boils    h/o MRSA    Family History  Problem Relation Age of Onset  . Hypertension Mother   . Diabetes Mother   . Hypertension Father   . Diabetes Father   . Hypertension Daughter    . Hypertension Sister   . Diabetes Sister   . Coronary artery disease Neg Hx   . Stroke Neg Hx   . Colon cancer Neg Hx   . Breast cancer Neg Hx    Past Surgical History:  Procedure Laterality Date  . CHOLECYSTECTOMY    . TONSILLECTOMY     Social History   Social History Narrative   Lives w/ husband   Patient is married with 3 children.   Patient is right handed.   Patient has college education.   Patient drinks 3-4 daily.   Immunization History  Administered Date(s) Administered  . Influenza Split 08/01/2012  . Influenza Whole 10/14/2009  . Influenza, High Dose Seasonal PF 09/12/2013, 08/19/2015, 09/14/2018  . Influenza,inj,Quad PF,6+ Mos 09/24/2014  . Influenza-Unspecified 10/01/2019  . PFIZER SARS-COV-2 Vaccination 12/11/2019, 01/01/2020  . Pneumococcal Conjugate-13 01/15/2014  . Pneumococcal Polysaccharide-23 03/31/2012  . Td 08/14/2010     Objective: Vital Signs: BP (!) 165/85 (BP Location: Left Arm, Patient Position: Sitting, Cuff Size: Normal)   Pulse 81   Resp 13   Ht 5' (1.524 [name])   Wt 136 lb 9.6 oz (62 kg)   BMI 26.68 kg/[name]    Physical Exam Vitals and nursing note reviewed.  Constitutional:      Appearance: She is well-developed.  HENT:     Head: Normocephalic and atraumatic.  Eyes:     Conjunctiva/sclera: Conjunctivae normal.  Pulmonary:     Effort: Pulmonary effort is normal.  Abdominal:     Palpations: Abdomen is soft.  Musculoskeletal:     Cervical back: Normal range of motion.  Skin:    General: Skin is warm and dry.     Capillary Refill: Capillary refill takes less than 2 seconds.  Neurological:     Mental Status: She is alert and oriented to person, place, and time.  Psychiatric:        Behavior: Behavior normal.      Musculoskeletal Exam: C-spine limited ROM with lateral rotation. Postural thoracic kyphosis noted.  No midline spinal tenderness or SI joint tenderness. Right shoulder painful and limited ROM.  Left shoulder full ROM  with no discomfort.  Elbow joints good ROM with no tenderness or inflammation.  Wrist joints good ROM with on tenderness or inflammation.  Thickening of MCP joints noted.  PIP and DIP thickening consistent with osteoarthritis of both hands. Synovitis and joint tenderness of right 2nd and 3rd MCPs and left 3rd and 4th PIP joints.  Swelling and warmth of both knee joints.  Ankle joints good ROM with no tenderness or inflammation.   CDAI Exam: CDAI Score: -- Patient Global: --; Provider Global: -- Swollen: 7 ; Tender: 5  Joint Exam 10/14/2020      Right  Left  Glenohumeral  Swollen Tender     PIP 2  Swollen Tender     PIP 3  Swollen Tender  Swollen Tender  PIP 4     Swollen Tender  Knee  Swollen   Swollen      Investigation: No additional findings.  Imaging: No results found.  Recent Labs: Lab Results  Component Value Date   WBC 9.9 08/27/2020   HGB 11.3 (L) 08/27/2020   PLT 372 08/27/2020   NA 137 08/27/2020   K 3.1 (L) 08/27/2020   CL 95 (L) 08/27/2020   CO2 25 08/27/2020   GLUCOSE 529 (HH) 08/27/2020   BUN 10 08/27/2020   CREATININE 0.96 08/27/2020   BILITOT 0.4 08/26/2020   ALKPHOS 122 (H) 11/16/2018   AST 8 (L) 08/26/2020   ALT 7 08/26/2020   PROT 7.0 08/26/2020   ALBUMIN 4.2 11/16/2018   CALCIUM 9.9 08/27/2020   GFRAA 76 08/26/2020    Speciality Comments: No specialty comments available.  Procedures:  No procedures performed Allergies: Colchicine, Hydrocodone, Sulindac, Tramadol, Celecoxib, and Glimepiride   Assessment / Plan:     Visit Diagnoses: Primary osteoarthritis of both hands: She has PIP and DIP thickening consistent with osteoarthritis of both hands.  She has tenderness and synovitis of several PIP joints as described above.  She has difficulty making a fist bilaterally.  She has been experiencing increased pain, stiffness, and swelling in her right hand over the past several months.  X-rays of the right hand were obtained on 08/26/2020 which were  consistent with osteoarthritis and pseudogout.  She will be starting on colchicine 0.3 mg daily.  She was also given a list of natural anti-inflammatories to start taking.  She will follow-up in the office in 4 weeks to assess her response  Primary osteoarthritis of both knees: She is not experiencing any discomfort in her knee joints currently.  On examination today she has warmth and swelling in both knees especially in the right knee joint.  She has severe osteoarthritis and chondrocalcinosis of both knee joints evident on x-rays from 01/10/2018.  She was given a list of natural antiinflammatories to start taking.   Primary osteoarthritis of both feet: She is not experiencing any discomfort in her feet at this time.  She wears proper fitting shoes.  History of calcium pyrophosphate deposition disease (CPPD) -She has chondrocalcinosis in both knees, right shoulder, and right hand evident on x-rays.  She is currently having a flare in her right shoulder and the right hand.  She has tried taking colchicine in the past but discontinued due to nausea.  She developed a rash while taking Celebrex and discontinued.  She is not a good candidate for oral prednisone or cortisone injections due to history of uncontrolled diabetes.  Different treatment options were discussed today in detail.  She is willing to try low-dose colchicine 0.3 mg daily.  She was advised to notify us if she cannot tolerate this dose.  Meloxicam 7.5 mg 1 tablet by mouth daily may be a treatment option in the future if she cannot tolerate taking the reduced dose of colchicine.  She will require close lab monitoring.  CMP ordered today and prescription for colchicine is pending lab results.  Her and her daughter present during the visit are both in agreement with the above plan.  Medication monitoring encounter -CMP drawn today.  Prescription pending lab results. Plan: COMPLETE METABOLIC PANEL WITH GFR  Osteopenia of multiple sites: DEXA  ordered by Dr. Renae Gloss. She will continue to take vitamin D 2,000 units daily.   Trochanteric bursitis of both hips: Resolved.  No tenderness to palpation on exam.  Other medical conditions are listed as follows:   History of hyperlipidemia  History of diabetes mellitus  History of diverticulitis  History of hypertension  History of insomnia    Orders: Orders Placed This Encounter  Procedures  . COMPLETE METABOLIC PANEL WITH GFR   No orders of the defined types were placed in this encounter.     Follow-Up Instructions: Return in about 4 weeks (around 11/11/2020) for Osteoarthritis, CPPD.   Gearldine Bienenstock, PA-C  Note - This record has been created using Dragon software.  Chart creation errors have been sought, but may not always  have been located. Such creation errors do not reflect on  the standard of medical care.

## 2020-10-06 ENCOUNTER — Telehealth: Payer: Self-pay | Admitting: Rheumatology

## 2020-10-06 NOTE — Telephone Encounter (Signed)
I spoke with patient and patient states she is not taking colchicine. Patient reports taking it a "few times in the past" but she did not tolerate it too well. I advised patient that she can try taking ibuprofen 400mg  BID for 5 days. Patient verbalized understanding.

## 2020-10-06 NOTE — Telephone Encounter (Signed)
Patient's daughter called (who is listed on DPR) and I advised her of the conversation I had with the patient.

## 2020-10-06 NOTE — Telephone Encounter (Signed)
Patient having severe pain in her right shoulder all the way down to hand. Daughter was wondering if there is anything patient can take to help the pain. Patient unable to take Prednisone due to increased blood sugars. Patient uses [facility] on [address]. Please call to advise.

## 2020-10-06 NOTE — Telephone Encounter (Signed)
Please clarify if the patient is taking colchicine.    She can try taking ibuprofen 400 mg BID for 5 days for pain relief.

## 2020-10-14 ENCOUNTER — Ambulatory Visit (INDEPENDENT_AMBULATORY_CARE_PROVIDER_SITE_OTHER): Payer: Medicare Other | Admitting: Physician Assistant

## 2020-10-14 ENCOUNTER — Encounter: Payer: Self-pay | Admitting: Physician Assistant

## 2020-10-14 ENCOUNTER — Other Ambulatory Visit: Payer: Self-pay

## 2020-10-14 VITALS — BP 165/85 | HR 81 | Resp 13 | Ht 60.0 in | Wt 136.6 lb

## 2020-10-14 DIAGNOSIS — Z87898 Personal history of other specified conditions: Secondary | ICD-10-CM

## 2020-10-14 DIAGNOSIS — M7061 Trochanteric bursitis, right hip: Secondary | ICD-10-CM

## 2020-10-14 DIAGNOSIS — Z8679 Personal history of other diseases of the circulatory system: Secondary | ICD-10-CM

## 2020-10-14 DIAGNOSIS — Z8719 Personal history of other diseases of the digestive system: Secondary | ICD-10-CM

## 2020-10-14 DIAGNOSIS — M19072 Primary osteoarthritis, left ankle and foot: Secondary | ICD-10-CM

## 2020-10-14 DIAGNOSIS — M19071 Primary osteoarthritis, right ankle and foot: Secondary | ICD-10-CM | POA: Diagnosis not present

## 2020-10-14 DIAGNOSIS — M8589 Other specified disorders of bone density and structure, multiple sites: Secondary | ICD-10-CM

## 2020-10-14 DIAGNOSIS — M7062 Trochanteric bursitis, left hip: Secondary | ICD-10-CM

## 2020-10-14 DIAGNOSIS — Z8739 Personal history of other diseases of the musculoskeletal system and connective tissue: Secondary | ICD-10-CM

## 2020-10-14 DIAGNOSIS — M170 Bilateral primary osteoarthritis of knee: Secondary | ICD-10-CM | POA: Diagnosis not present

## 2020-10-14 DIAGNOSIS — M19042 Primary osteoarthritis, left hand: Secondary | ICD-10-CM

## 2020-10-14 DIAGNOSIS — Z5181 Encounter for therapeutic drug level monitoring: Secondary | ICD-10-CM

## 2020-10-14 DIAGNOSIS — M19041 Primary osteoarthritis, right hand: Secondary | ICD-10-CM

## 2020-10-14 DIAGNOSIS — Z8639 Personal history of other endocrine, nutritional and metabolic disease: Secondary | ICD-10-CM

## 2020-10-14 NOTE — Telephone Encounter (Signed)
Pending lab results, patient will be starting colchicine 0.3mg  daily per Sherron Ales, PA-C. Thanks!

## 2020-10-14 NOTE — Telephone Encounter (Signed)
[name] [name] (daughter ) requests a call with the lab results, and when medication is sent to pharmacy also.

## 2020-10-15 ENCOUNTER — Ambulatory Visit: Payer: Medicare Other | Admitting: Rheumatology

## 2020-10-15 LAB — COMPLETE METABOLIC PANEL WITH GFR
AG Ratio: 1.4 (calc) (ref 1.0–2.5)
ALT: 6 U/L (ref 6–29)
AST: 9 U/L — ABNORMAL LOW (ref 10–35)
Albumin: 3.8 g/dL (ref 3.6–5.1)
Alkaline phosphatase (APISO): 96 U/L (ref 37–153)
BUN/Creatinine Ratio: 7 (calc) (ref 6–22)
BUN: 5 mg/dL — ABNORMAL LOW (ref 7–25)
CO2: 35 mmol/L — ABNORMAL HIGH (ref 20–32)
Calcium: 9.5 mg/dL (ref 8.6–10.4)
Chloride: 96 mmol/L — ABNORMAL LOW (ref 98–110)
Creat: 0.71 mg/dL (ref 0.60–0.93)
GFR, Est African American: 98 mL/min/{1.73_m2} (ref >=60)
GFR, Est Non African American: 84 mL/min/{1.73_m2} (ref >=60)
Globulin: 2.7 g/dL (calc) (ref 1.9–3.7)
Glucose, Bld: 289 mg/dL — ABNORMAL HIGH (ref 65–99)
Potassium: 3.1 mmol/L — ABNORMAL LOW (ref 3.5–5.3)
Sodium: 141 mmol/L (ref 135–146)
Total Bilirubin: 0.3 mg/dL (ref 0.2–1.2)
Total Protein: 6.5 g/dL (ref 6.1–8.1)

## 2020-10-15 MED ORDER — COLCHICINE 0.6 MG PO TABS: ORAL_TABLET | ORAL | 2 refills | Status: DC

## 2020-10-15 NOTE — Addendum Note (Signed)
Addended by: Henriette Combs on: 10/15/2020 09:13 AM   Modules accepted: Orders

## 2020-10-15 NOTE — Progress Notes (Signed)
Glucose is elevated-289.  Potassium is low-3.1.  please notify the patient and forward lab work to PCP.    Creatinine and GFR are WNL.  AST borderline low.  ALT WNL.  Ok to send in colchicine 0.3 mg by mouth once daily.

## 2020-10-15 NOTE — Addendum Note (Signed)
Addended by: Gearldine Bienenstock on: 10/15/2020 12:02 PM   Modules accepted: Orders

## 2020-10-30 NOTE — Progress Notes (Deleted)
Office Visit Note  Patient: [name] [name]             Date of Birth: [birth year]           MRN: [id number]             PCP: Andi Devon, MD Referring: Andi Devon, MD Visit Date: 11/12/2020 Occupation: @GUAROCC @  Subjective:  No chief complaint on file.   History of Present Illness: [name] [name] is a 74 y.o. female ***   Activities of Daily Living:  Patient reports morning stiffness for *** {minute/hour:19697}.   Patient {ACTIONS;DENIES/REPORTS:21021675::"Denies"} nocturnal pain.  Difficulty dressing/grooming: {ACTIONS;DENIES/REPORTS:21021675::"Denies"} Difficulty climbing stairs: {ACTIONS;DENIES/REPORTS:21021675::"Denies"} Difficulty getting out of chair: {ACTIONS;DENIES/REPORTS:21021675::"Denies"} Difficulty using hands for taps, buttons, cutlery, and/or writing: {ACTIONS;DENIES/REPORTS:21021675::"Denies"}  No Rheumatology ROS completed.   PMFS History:  Patient Active Problem List   Diagnosis Date Noted  . 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 History of hypertension 12/08/2016  . History of diabetes mellitus 12/08/2016  . Dyslipidemia 12/08/2016  . Osteoarthritis of both hands 09/14/2016  . Osteoarthritis of both feet 09/14/2016  . Osteoarthritis of both knees 09/14/2016  . PCP NOTES >>> 08/19/2015  . Low back pain 09/09/2014  . Neuralgia of left saphenous nerve 06/05/2014  . Numbness and tingling in left arm 06/05/2014  . Paresthesia of left arm 05/21/2014  . Gastroparesis 02/20/2013  . Insomnia 02/20/2013  . Annual physical exam 03/31/2012  . History of diverticulitis 04/14/2010  . Osteopenia 05/14/2009  . DYSPHAGIA UNSPECIFIED 01/10/2009  . 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 Hyperlipidemia 06/14/2007  . Essential hypertension 06/14/2007  . Osteoarthritis 06/14/2007  . DM (diabetes mellitus) (HCC) 01/02/2007    Past Medical History:  Diagnosis Date  . Bilateral knee pain    Euflexxa x3  . Chest pain 06/2007   CP and QW on EKG: neg stress test    . Diabetes mellitus    w/ neuropathy  . Diverticulitis 2000  . Gout   . Hyperlipidemia   . Hypertension   . Insomnia 02/20/2013  . Osteoarthritis    gout, CPPD--- sees Dr.Davenshwar  . Osteoarthritis of both feet 09/14/2016  . Osteoarthritis of both hands 09/14/2016  . Osteoarthritis of both knees 09/14/2016  . Osteopenia    per DEXA 4/10  . Recurrent boils    h/o MRSA    Family History  Problem Relation Age of Onset  . Hypertension Mother   . Diabetes Mother   . Hypertension Father   . Diabetes Father   . Hypertension Daughter   . Hypertension Sister   . Diabetes Sister   . Coronary artery disease Neg Hx   . Stroke Neg Hx   . Colon cancer Neg Hx   . Breast cancer Neg Hx    Past Surgical History:  Procedure Laterality Date  . CHOLECYSTECTOMY    . TONSILLECTOMY     Social History   Social History Narrative   Lives w/ husband   Patient is married with 3 children.   Patient is right handed.   Patient has college education.   Patient drinks 3-4 daily.   Immunization History  Administered Date(s) Administered  . Influenza Split 08/01/2012  . Influenza Whole 10/14/2009  . Influenza, High Dose Seasonal PF 09/12/2013, 08/19/2015, 09/14/2018  . Influenza,inj,Quad PF,6+ Mos 09/24/2014  . Influenza-Unspecified 10/01/2019  . PFIZER SARS-COV-2 Vaccination 12/11/2019, 01/01/2020  . Pneumococcal Conjugate-13 01/15/2014  . Pneumococcal Polysaccharide-23 03/31/2012  . Td 08/14/2010     Objective: Vital Signs: There were no vitals  taken for this visit.   Physical Exam   Musculoskeletal Exam: ***  CDAI Exam: CDAI Score: -- Patient Global: --; Provider Global: -- Swollen: --; Tender: -- Joint Exam 11/12/2020   No joint exam has been documented for this visit   There is currently no information documented on the homunculus. Go to the Rheumatology activity and complete the homunculus joint exam.  Investigation: No additional findings.  Imaging: No results  found.  Recent Labs: Lab Results  Component Value Date   WBC 9.9 08/27/2020   HGB 11.3 (L) 08/27/2020   PLT 372 08/27/2020   NA 141 10/14/2020   K 3.1 (L) 10/14/2020   CL 96 (L) 10/14/2020   CO2 35 (H) 10/14/2020   GLUCOSE 289 (H) 10/14/2020   BUN 5 (L) 10/14/2020   CREATININE 0.71 10/14/2020   BILITOT 0.3 10/14/2020   ALKPHOS 122 (H) 11/16/2018   AST 9 (L) 10/14/2020   ALT 6 10/14/2020   PROT 6.5 10/14/2020   ALBUMIN 4.2 11/16/2018   CALCIUM 9.5 10/14/2020   GFRAA 98 10/14/2020    Speciality Comments: No specialty comments available.  Procedures:  No procedures performed Allergies: Colchicine, Hydrocodone, Sulindac, Tramadol, Celecoxib, and Glimepiride   Assessment / Plan:     Visit Diagnoses: Primary osteoarthritis of both hands  Primary osteoarthritis of both knees  Primary osteoarthritis of both feet  History of calcium pyrophosphate deposition disease (CPPD) - Restarted colchicine 0.6 mg half tablet daily   Osteopenia of multiple sites  Trochanteric bursitis of both hips  History of hyperlipidemia  History of diabetes mellitus  History of diverticulitis  History of hypertension  History of insomnia  Orders: No orders of the defined types were placed in this encounter.  No orders of the defined types were placed in this encounter.   Face-to-face time spent with patient was *** minutes. Greater than 50% of time was spent in counseling and coordination of care.  Follow-Up Instructions: No follow-ups on file.   Gearldine Bienenstock, PA-C  Note - This record has been created using Dragon software.  Chart creation errors have been sought, but may not always  have been located. Such creation errors do not reflect on  the standard of medical care.

## 2020-11-12 ENCOUNTER — Ambulatory Visit: Payer: Medicare Other | Admitting: Physician Assistant

## 2020-11-24 ENCOUNTER — Telehealth: Payer: Self-pay | Admitting: Rheumatology

## 2020-11-24 ENCOUNTER — Telehealth: Payer: Self-pay

## 2020-11-24 ENCOUNTER — Ambulatory Visit (INDEPENDENT_AMBULATORY_CARE_PROVIDER_SITE_OTHER): Payer: Medicare Other | Admitting: Rheumatology

## 2020-11-24 ENCOUNTER — Encounter: Payer: Self-pay | Admitting: Rheumatology

## 2020-11-24 ENCOUNTER — Other Ambulatory Visit: Payer: Self-pay

## 2020-11-24 VITALS — BP 166/83 | HR 86 | Resp 14 | Ht 60.0 in | Wt 132.2 lb

## 2020-11-24 DIAGNOSIS — Z8739 Personal history of other diseases of the musculoskeletal system and connective tissue: Secondary | ICD-10-CM | POA: Diagnosis not present

## 2020-11-24 DIAGNOSIS — M19041 Primary osteoarthritis, right hand: Secondary | ICD-10-CM

## 2020-11-24 DIAGNOSIS — Z8719 Personal history of other diseases of the digestive system: Secondary | ICD-10-CM

## 2020-11-24 DIAGNOSIS — M19042 Primary osteoarthritis, left hand: Secondary | ICD-10-CM

## 2020-11-24 DIAGNOSIS — M19071 Primary osteoarthritis, right ankle and foot: Secondary | ICD-10-CM

## 2020-11-24 DIAGNOSIS — Z8679 Personal history of other diseases of the circulatory system: Secondary | ICD-10-CM

## 2020-11-24 DIAGNOSIS — M170 Bilateral primary osteoarthritis of knee: Secondary | ICD-10-CM | POA: Diagnosis not present

## 2020-11-24 DIAGNOSIS — M8589 Other specified disorders of bone density and structure, multiple sites: Secondary | ICD-10-CM

## 2020-11-24 DIAGNOSIS — Z8639 Personal history of other endocrine, nutritional and metabolic disease: Secondary | ICD-10-CM

## 2020-11-24 DIAGNOSIS — Z87898 Personal history of other specified conditions: Secondary | ICD-10-CM

## 2020-11-24 DIAGNOSIS — M19072 Primary osteoarthritis, left ankle and foot: Secondary | ICD-10-CM

## 2020-11-24 MED ORDER — COLCHICINE 0.6 MG PO TABS
ORAL_TABLET | ORAL | 2 refills | Status: DC
Start: 2020-11-24 — End: 2022-02-23

## 2020-11-24 NOTE — Telephone Encounter (Signed)
Patient's daughter [name] [name] called stating [name] [name]'s right hand is still swollen and painful.  [name] [name] states patient has taken Ibuprofen, used ice, elevated in sling, but still cannot get any relief.  [name] [name] requested to speak with the nurse directly before scheduling an appointment since patient cannot drive and she is at work.

## 2020-11-24 NOTE — Telephone Encounter (Signed)
Spoke with Lorrie and advised her patient is to start Colchicine 0.3 mg daily. Advised we sent a new prescription into the pharmacy. She states she will make sure it is picked up. Advised that if patient can not tolerate the Colchicine to let us know and we will discuss other options.

## 2020-11-24 NOTE — Telephone Encounter (Signed)
Daughter calling to find out what was discussed at patient's visit today. What does she need to do? Please call to advise.

## 2020-11-24 NOTE — Progress Notes (Signed)
Office Visit Note  Patient: [name] [name]             Date of Birth: 1946/07/04           MRN: [id number]             PCP: Andi Devon, MD Referring: Andi Devon, MD Visit Date: 11/24/2020 Occupation: @GUAROCC @  Subjective:  Right hand swelling.   History of Present Illness: [name] [name] is a 75 y.o. female with history of osteoarthritis and pseudogout.  She states she did not pick up the prescription for colchicine.  She has been having pain and swelling in her right hand for the last 3 to 4 days.  None of the other joints are painful.  Activities of Daily Living:  Patient reports morning stiffness for 0 minutes.   Patient Denies nocturnal pain.  Difficulty dressing/grooming: Denies Difficulty climbing stairs: Denies Difficulty getting out of chair: Denies Difficulty using hands for taps, buttons, cutlery, and/or writing: Reports  Review of Systems  Constitutional: Negative for fatigue.  HENT: Negative for mouth sores, mouth dryness and nose dryness.   Eyes: Negative for pain, itching and dryness.  Respiratory: Negative for shortness of breath and difficulty breathing.   Cardiovascular: Negative for chest pain and palpitations.  Gastrointestinal: Negative for blood in stool, constipation and diarrhea.  Endocrine: Negative for increased urination.  Genitourinary: Negative for difficulty urinating.  Musculoskeletal: Positive for arthralgias, joint pain and joint swelling. Negative for myalgias, morning stiffness, muscle tenderness and myalgias.  Skin: Negative for color change, rash and redness.  Allergic/Immunologic: Negative for susceptible to infections.  Neurological: Negative for dizziness, numbness, headaches, memory loss and weakness.  Hematological: Negative for bruising/bleeding tendency.  Psychiatric/Behavioral: Negative for confusion.    PMFS History:  Patient Active Problem List   Diagnosis Date Noted  . 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  History of hypertension 12/08/2016  . History of diabetes mellitus 12/08/2016  . Dyslipidemia 12/08/2016  . Osteoarthritis of both hands 09/14/2016  . Osteoarthritis of both feet 09/14/2016  . Osteoarthritis of both knees 09/14/2016  . PCP NOTES >>> 08/19/2015  . Low back pain 09/09/2014  . Neuralgia of left saphenous nerve 06/05/2014  . Numbness and tingling in left arm 06/05/2014  . Paresthesia of left arm 05/21/2014  . Gastroparesis 02/20/2013  . Insomnia 02/20/2013  . Annual physical exam 03/31/2012  . History of diverticulitis 04/14/2010  . Osteopenia 05/14/2009  . DYSPHAGIA UNSPECIFIED 01/10/2009  . 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 Hyperlipidemia 06/14/2007  . Essential hypertension 06/14/2007  . Osteoarthritis 06/14/2007  . DM (diabetes mellitus) (HCC) 01/02/2007    Past Medical History:  Diagnosis Date  . Bilateral knee pain    Euflexxa x3  . Chest pain 06/2007   CP and QW on EKG: neg stress test   . Diabetes mellitus    w/ neuropathy  . Diverticulitis 2000  . Gout   . Hyperlipidemia   . Hypertension   . Insomnia 02/20/2013  . Osteoarthritis    gout, CPPD--- sees Dr.Davenshwar  . Osteoarthritis of both feet 09/14/2016  . Osteoarthritis of both hands 09/14/2016  . Osteoarthritis of both knees 09/14/2016  . Osteopenia    per DEXA 4/10  . Recurrent boils    h/o MRSA    Family History  Problem Relation Age of Onset  . Hypertension Mother   . Diabetes Mother   . Hypertension Father   . Diabetes Father   . Hypertension Daughter   . Hypertension  Sister   . Diabetes Sister   . Coronary artery disease Neg Hx   . Stroke Neg Hx   . Colon cancer Neg Hx   . Breast cancer Neg Hx    Past Surgical History:  Procedure Laterality Date  . CHOLECYSTECTOMY    . TONSILLECTOMY     Social History   Social History Narrative   Lives w/ husband   Patient is married with 3 children.   Patient is right handed.   Patient has college education.   Patient drinks 3-4  daily.   Immunization History  Administered Date(s) Administered  . Influenza Split 08/01/2012  . Influenza Whole 10/14/2009  . Influenza, High Dose Seasonal PF 09/12/2013, 08/19/2015, 09/14/2018  . Influenza,inj,Quad PF,6+ Mos 09/24/2014  . Influenza-Unspecified 10/01/2019  . PFIZER SARS-COV-2 Vaccination 12/11/2019, 01/01/2020  . Pneumococcal Conjugate-13 01/15/2014  . Pneumococcal Polysaccharide-23 03/31/2012  . Td 08/14/2010     Objective: Vital Signs: BP (!) 166/83 (BP Location: Left Arm, Patient Position: Sitting, Cuff Size: Normal)   Pulse 86   Resp 14   Ht 5' (1.524 m)   Wt 132 lb 3.2 oz (60 kg)   BMI 25.82 kg/m    Physical Exam Vitals and nursing note reviewed.  Constitutional:      Appearance: She is well-developed and well-nourished.  HENT:     Head: Normocephalic and atraumatic.  Eyes:     Extraocular Movements: EOM normal.     Conjunctiva/sclera: Conjunctivae normal.  Cardiovascular:     Rate and Rhythm: Normal rate and regular rhythm.     Pulses: Intact distal pulses.     Heart sounds: Normal heart sounds.  Pulmonary:     Effort: Pulmonary effort is normal.     Breath sounds: Normal breath sounds.  Abdominal:     General: Bowel sounds are normal.     Palpations: Abdomen is soft.  Musculoskeletal:     Cervical back: Normal range of motion.  Lymphadenopathy:     Cervical: No cervical adenopathy.  Skin:    General: Skin is warm and dry.     Capillary Refill: Capillary refill takes less than 2 seconds.  Neurological:     Mental Status: She is alert and oriented to person, place, and time.  Psychiatric:        Mood and Affect: Mood and affect normal.        Behavior: Behavior normal.      Musculoskeletal Exam: C-spine was in good range of motion. Shoulder joints and elbow joints with good range of motion. She is swelling over the dorsum of the right hand and also over the right hand MCPs and PIPs. Left hand was in full range of motion with no  synovitis over MCPs PIPs. Hip joints, knee joints, ankles, MTPs with good range of motion with no synovitis.  CDAI Exam: CDAI Score: -- Patient Global: --; Provider Global: -- Swollen: --; Tender: -- Joint Exam 11/24/2020   No joint exam has been documented for this visit   There is currently no information documented on the homunculus. Go to the Rheumatology activity and complete the homunculus joint exam.  Investigation: No additional findings.  Imaging: No results found.  Recent Labs: Lab Results  Component Value Date   WBC 9.9 08/27/2020   HGB 11.3 (L) 08/27/2020   PLT 372 08/27/2020   NA 141 10/14/2020   K 3.1 (L) 10/14/2020   CL 96 (L) 10/14/2020   CO2 35 (H) 10/14/2020   GLUCOSE 289 (H) 10/14/2020  BUN 5 (L) 10/14/2020   CREATININE 0.71 10/14/2020   BILITOT 0.3 10/14/2020   ALKPHOS 122 (H) 11/16/2018   AST 9 (L) 10/14/2020   ALT 6 10/14/2020   PROT 6.5 10/14/2020   ALBUMIN 4.2 11/16/2018   CALCIUM 9.5 10/14/2020   GFRAA 98 10/14/2020    Speciality Comments: No specialty comments available.  Procedures:  No procedures performed Allergies: Colchicine, Hydrocodone, Sulindac, Tramadol, Celecoxib, and Glimepiride   Assessment / Plan:     Visit Diagnoses: Primary osteoarthritis of both hands - X-rays of the right hand were obtained on 08/26/2020 which were consistent with osteoarthritis and pseudogout. She has significant pain and swelling in her right hand. She was placed on colchicine at the last visit but she did not pick up the prescription. Nasal counseling was provided. She was advised to get colchicine today. Her dose will be 0.3 mg p.o. daily. She is unable to take prednisone as she is diabetic.  Primary osteoarthritis of both knees-doing well without any synovitis.  Primary osteoarthritis of both feet-she denies any discomfort currently.  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 She is having a flare with pain and swelling in her right hand for the last 2 to 3 days. A prescription for colchicine 0.3mg  po qd was sent again. She is unable to take the higher dose of colchicine due to diltiazem use. She also had nausea with colchicine in the past. If she is unable to tolerate colchicine she is supposed to notify us and we can try meloxicam at lower dose. Patient voiced understanding.  Osteopenia of multiple sites - DEXA ordered by Dr. Renae Gloss  Trochanteric bursitis of both hips - Resolved.   History of diabetes mellitus  History of hyperlipidemia  History of hypertension-blood pressure is elevated. She has been advised to monitor blood pressure and follow-up with her PCP.  History of diverticulitis  History of insomnia  Orders: No orders of the defined types were placed in this encounter.  Meds ordered this encounter  Medications  . colchicine 0.6 MG tablet    Sig: Take a half tablet (0.3 mg total) by mouth daily.    Dispense:  15 tablet    Refill:  2     Follow-Up Instructions: Return in about 2 months (around 01/22/2021) for OA, pseudogout.   Pollyann Savoy, MD  Note - This record has been created using Animal nutritionist.  Chart creation errors have been sought, but may not always  have been located. Such creation errors do not reflect on  the standard of medical care.

## 2020-11-24 NOTE — Patient Instructions (Addendum)
Please take colchicine 0.6 mg tablet, half tablet daily.

## 2020-11-24 NOTE — Telephone Encounter (Signed)
Patient scheduled for an appointment today at 11:30 am.

## 2020-12-11 ENCOUNTER — Telehealth: Payer: Self-pay

## 2020-12-11 NOTE — Telephone Encounter (Signed)
Patient's daughter [name] [name] called stating her fingers are still swollen and painful, as well as her right shoulder and elbow.  [name] [name] states patient has been using ice and taking the Colchicine.  [name] [name] states her dad told her she fell out of bed, but patient denies falling recently.  [name] [name] is requesting a return call to discuss ordering an MRI to see if anything is broken.

## 2020-12-11 NOTE — Telephone Encounter (Signed)
Spoke with patient's daughter Hilbert Corrigan. Advised her that she is welcome to bring her mother here for an appointment and we can assess and obtain x-rays. Advised her she also has the option to see ortho and they can obtain x-rays and treat if fractured. Advised if they do not find a fracture she may call back and schedule an appointment here to assess the swelling. Lorrie states she will have her seen by ortho and let us know what they say.

## 2020-12-16 ENCOUNTER — Telehealth: Payer: Self-pay

## 2020-12-16 DIAGNOSIS — M79641 Pain in right hand: Secondary | ICD-10-CM

## 2020-12-16 NOTE — Telephone Encounter (Signed)
Patient's daughter Hilbert Corrigan called stating [facility] needs a referral from Dr. Corliss Skains so she can schedule an appointment.  Lorrie requested her name and number be listed on the referral as the contact person.

## 2020-12-16 NOTE — Telephone Encounter (Signed)
Ok to refer.

## 2020-12-16 NOTE — Telephone Encounter (Signed)
Referral placed and faxed over to [facility]. Spoke with patient's daughter [name] [name] to advise. She will reach out to [facility] to schedule appointment.

## 2020-12-17 ENCOUNTER — Ambulatory Visit (INDEPENDENT_AMBULATORY_CARE_PROVIDER_SITE_OTHER): Payer: Medicare Other | Admitting: Podiatry

## 2020-12-17 ENCOUNTER — Encounter: Payer: Self-pay | Admitting: Podiatry

## 2020-12-17 ENCOUNTER — Other Ambulatory Visit: Payer: Self-pay

## 2020-12-17 VITALS — BP 140/80 | HR 86 | Temp 98.2°F

## 2020-12-17 DIAGNOSIS — E663 Overweight: Secondary | ICD-10-CM | POA: Insufficient documentation

## 2020-12-17 DIAGNOSIS — M2142 Flat foot [pes planus] (acquired), left foot: Secondary | ICD-10-CM

## 2020-12-17 DIAGNOSIS — M2141 Flat foot [pes planus] (acquired), right foot: Secondary | ICD-10-CM

## 2020-12-17 DIAGNOSIS — M79674 Pain in right toe(s): Secondary | ICD-10-CM

## 2020-12-17 DIAGNOSIS — E119 Type 2 diabetes mellitus without complications: Secondary | ICD-10-CM | POA: Diagnosis not present

## 2020-12-17 DIAGNOSIS — M79675 Pain in left toe(s): Secondary | ICD-10-CM | POA: Diagnosis not present

## 2020-12-17 DIAGNOSIS — M2011 Hallux valgus (acquired), right foot: Secondary | ICD-10-CM

## 2020-12-17 DIAGNOSIS — M2012 Hallux valgus (acquired), left foot: Secondary | ICD-10-CM

## 2020-12-17 DIAGNOSIS — B351 Tinea unguium: Secondary | ICD-10-CM | POA: Diagnosis not present

## 2020-12-17 DIAGNOSIS — E559 Vitamin D deficiency, unspecified: Secondary | ICD-10-CM | POA: Insufficient documentation

## 2020-12-17 HISTORY — DX: Type 2 diabetes mellitus without complications: E11.9

## 2020-12-17 NOTE — Patient Instructions (Signed)
Diabetes Mellitus and Foot Care Foot care is an important part of your health, especially when you have diabetes. Diabetes may cause you to have problems because of poor blood flow (circulation) to your feet and legs, which can cause your skin to:  Become thinner and drier.  Break more easily.  Heal more slowly.  Peel and crack. You may also have nerve damage (neuropathy) in your legs and feet, causing decreased feeling in them. This means that you may not notice minor injuries to your feet that could lead to more serious problems. Noticing and addressing any potential problems early is the best way to prevent future foot problems. How to care for your feet Foot hygiene  Wash your feet daily with warm water and mild soap. Do not use hot water. Then, pat your feet and the areas between your toes until they are completely dry. Do not soak your feet as this can dry your skin.  Trim your toenails straight across. Do not dig under them or around the cuticle. File the edges of your nails with an emery board or nail file.  Apply a moisturizing lotion or petroleum jelly to the skin on your feet and to dry, brittle toenails. Use lotion that does not contain alcohol and is unscented. Do not apply lotion between your toes.   Shoes and socks  Wear clean socks or stockings every day. Make sure they are not too tight. Do not wear knee-high stockings since they may decrease blood flow to your legs.  Wear shoes that fit properly and have enough cushioning. Always look in your shoes before you put them on to be sure there are no objects inside.  To break in new shoes, wear them for just a few hours a day. This prevents injuries on your feet. Wounds, scrapes, corns, and calluses  Check your feet daily for blisters, cuts, bruises, sores, and redness. If you cannot see the bottom of your feet, use a mirror or ask someone for help.  Do not cut corns or calluses or try to remove them with medicine.  If you  find a minor scrape, cut, or break in the skin on your feet, keep it and the skin around it clean and dry. You may clean these areas with mild soap and water. Do not clean the area with peroxide, alcohol, or iodine.  If you have a wound, scrape, corn, or callus on your foot, look at it several times a day to make sure it is healing and not infected. Check for: ? Redness, swelling, or pain. ? Fluid or blood. ? Warmth. ? Pus or a bad smell.   General tips  Do not cross your legs. This may decrease blood flow to your feet.  Do not use heating pads or hot water bottles on your feet. They may burn your skin. If you have lost feeling in your feet or legs, you may not know this is happening until it is too late.  Protect your feet from hot and cold by wearing shoes, such as at the beach or on hot pavement.  Schedule a complete foot exam at least once a year (annually) or more often if you have foot problems. Report any cuts, sores, or bruises to your health care provider immediately. Where to find more information  American Diabetes Association: www.diabetes.org  Association of Diabetes Care & Education Specialists: www.diabeteseducator.org Contact a health care provider if:  You have a medical condition that increases your risk of infection and   you have any cuts, sores, or bruises on your feet.  You have an injury that is not healing.  You have redness on your legs or feet.  You feel burning or tingling in your legs or feet.  You have pain or cramps in your legs and feet.  Your legs or feet are numb.  Your feet always feel cold.  You have pain around any toenails. Get help right away if:  You have a wound, scrape, corn, or callus on your foot and: ? You have pain, swelling, or redness that gets worse. ? You have fluid or blood coming from the wound, scrape, corn, or callus. ? Your wound, scrape, corn, or callus feels warm to the touch. ? You have pus or a bad smell coming from  the wound, scrape, corn, or callus. ? You have a fever. ? You have a red line going up your leg. Summary  Check your feet every day for blisters, cuts, bruises, sores, and redness.  Apply a moisturizing lotion or petroleum jelly to the skin on your feet and to dry, brittle toenails.  Wear shoes that fit properly and have enough cushioning.  If you have foot problems, report any cuts, sores, or bruises to your health care provider immediately.  Schedule a complete foot exam at least once a year (annually) or more often if you have foot problems. This information is not intended to replace advice given to you by your health care provider. Make sure you discuss any questions you have with your health care provider. Document Revised: 05/22/2020 Document Reviewed: 05/22/2020 Elsevier Patient Education  2021 Elsevier Inc.  

## 2020-12-18 ENCOUNTER — Telehealth: Payer: Self-pay | Admitting: Rheumatology

## 2020-12-18 NOTE — Telephone Encounter (Signed)
Daughter called [facility] to schedule mother's appointment. Per [facility], they did not receive referral from our office. Please resend referral, and call daughter to let her know when she is okay to call [facility] back to schedule.

## 2020-12-18 NOTE — Telephone Encounter (Signed)
Sue Lush called daughter, Refaxed referral, pending appt.

## 2020-12-22 ENCOUNTER — Telehealth: Payer: Self-pay

## 2020-12-22 NOTE — Telephone Encounter (Signed)
FYI:  Patient's daughter Hilbert Corrigan called to let Dr. Corliss Skains know that Hadiya has an appointment at [facility] on 12/24/20 at 3:30 am.

## 2020-12-22 NOTE — Telephone Encounter (Signed)
Patient's chart noted.  

## 2020-12-24 NOTE — Progress Notes (Signed)
ANNUAL DIABETIC FOOT EXAM  Subjective: [name] [name] presents today for for annual diabetic foot examination and painful thick toenails that are difficult to trim. Pain interferes with ambulation. Aggravating factors include wearing enclosed shoe gear. Pain is relieved with periodic professional debridement. She was last seen in our office in 2018.  Patient relates 20 year h/o diabetes.  Patient denies h/o foot wounds.  Patient denies symptoms of foot numbness.  Patient denies symptoms of foot tingling.  Patient does not check blood glucose.  [name] Devon, MD is patient's PCP. Last visit was 10/15/2020.  Past Medical History:  Diagnosis Date  . Bilateral knee pain    Euflexxa x3  . Chest pain 06/2007   CP and QW on EKG: neg stress test   . Diabetes mellitus    w/ neuropathy  . Diverticulitis 2000  . Gout   . Hyperlipidemia   . Hypertension   . Insomnia 02/20/2013  . Osteoarthritis    gout, CPPD--- sees Dr.Davenshwar  . Osteoarthritis of both feet 09/14/2016  . Osteoarthritis of both hands 09/14/2016  . Osteoarthritis of both knees 09/14/2016  . Osteopenia    per DEXA 4/10  . Recurrent boils    h/o MRSA   Patient Active Problem List   Diagnosis Date Noted  . Overweight 12/17/2020  . Type 2 diabetes mellitus without complication (HCC) 12/17/2020  . Vitamin D deficiency 12/17/2020  . 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 History of hypertension 12/08/2016  . History of diabetes mellitus 12/08/2016  . Dyslipidemia 12/08/2016  . Osteoarthritis of both hands 09/14/2016  . Osteoarthritis of both feet 09/14/2016  . Osteoarthritis of both knees 09/14/2016  . PCP NOTES >>> 08/19/2015  . Low back pain 09/09/2014  . Neuralgia of left saphenous nerve 06/05/2014  . Numbness and tingling in left arm 06/05/2014  . Paresthesia of left arm 05/21/2014  . Gastroparesis 02/20/2013  . Insomnia 02/20/2013  . Annual physical exam 03/31/2012  . History of diverticulitis 04/14/2010   . Osteopenia 05/14/2009  . DYSPHAGIA UNSPECIFIED 01/10/2009  . 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 Hyperlipidemia 06/14/2007  . Essential hypertension 06/14/2007  . Osteoarthritis 06/14/2007  . DM (diabetes mellitus) (HCC) 01/02/2007   Past Surgical History:  Procedure Laterality Date  . CHOLECYSTECTOMY    . TONSILLECTOMY     Current Outpatient Medications on File Prior to Visit  Medication Sig Dispense Refill  . aspirin 81 MG tablet Take 81 mg by mouth daily.    . Cholecalciferol (VITAMIN D) 2000 UNITS CAPS Take 2,000 Units by mouth 2 (two) times a week. Reported on 11/18/2015    . colchicine 0.6 MG tablet Take a half tablet (0.3 mg total) by mouth daily. 15 tablet 2  . diltiazem (TIAZAC) 300 MG 24 hr capsule TAKE 1 CAPSULE BY MOUTH EVERY DAY (Patient taking differently: Take 300 mg by mouth daily.) 30 capsule 5  . glucose blood (ONETOUCH VERIO) test strip USE TO TEST BLOOD SUGAR 3 TIMES DAILY 100 each 0  . SitaGLIPtin-MetFORMIN HCl (JANUMET XR) 50-500 MG TB24 Take 2 tablets by mouth daily. 60 tablet 0  . trolamine salicylate (ASPERCREME) 10 % cream Apply 1 application topically as needed for muscle pain.     No current facility-administered medications on file prior to visit.    Allergies  Allergen Reactions  . Colchicine Nausea Only  . Hydrocodone Other (See Comments)  . Sulindac Other (See Comments)  . Tramadol Other (See Comments)    "Made me feel like I was  in another world."  . Celecoxib Rash  . Glimepiride Rash   Social History   Occupational History  . 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 Smoking status: Never Smoker  . Smokeless tobacco: Never Used  Vaping Use  . Vaping Use: Never used  Substance and Sexual Activity  . Alcohol use: No  . Drug use: No  . Sexual activity: Not Currently   Family History  Problem Relation Age of Onset  . Hypertension  Mother   . Diabetes Mother   . Hypertension Father   . Diabetes Father   . Hypertension Daughter   . Hypertension Sister   . Diabetes Sister   . Coronary artery disease Neg Hx   . Stroke Neg Hx   . Colon cancer Neg Hx   . Breast cancer Neg Hx    Immunization History  Administered Date(s) Administered  . Influenza Split 08/01/2012  . Influenza Whole 10/14/2009  . Influenza, High Dose Seasonal PF 09/12/2013, 08/19/2015, 09/14/2018  . Influenza,inj,Quad PF,6+ Mos 09/24/2014  . Influenza-Unspecified 10/01/2019  . PFIZER(Purple Top)SARS-COV-2 Vaccination 12/11/2019, 01/01/2020  . Pneumococcal Conjugate-13 01/15/2014  . Pneumococcal Polysaccharide-23 03/31/2012  . Td 08/14/2010     Review of Systems: Negative except as noted in the HPI.  Objective: Vitals:   12/17/20 1555  BP: 140/80  Pulse: 86  Temp: 98.2 F (36.8 C)    [name] [name] is a pleasant 75 y.o. female in NAD. AAO X 3.  Vascular Examination: Capillary refill time to digits immediate b/l. Palpable pedal pulses b/l LE. Pedal hair absent. Lower extremity skin temperature gradient within normal limits. No pain with calf compression b/l. No edema noted b/l lower extremities.  Dermatological Examination: Pedal skin is thin shiny, atrophic b/l lower extremities. No open wounds bilaterally. No interdigital macerations bilaterally. Toenails 1-5 b/l elongated, discolored, dystrophic, thickened, crumbly with subungual debris and tenderness to dorsal palpation.  Musculoskeletal Examination: Normal muscle strength 5/5 to all lower extremity muscle groups bilaterally. No pain crepitus or joint limitation noted with ROM b/l. Hallux valgus with bunion deformity noted b/l lower extremities. Pes planus deformity noted b/l.  Patient ambulates independent of any assistive aids.  Footwear Assessment: Does the patient wear appropriate shoes? Yes. Does the patient need inserts/orthotics? Yes..  Neurological  Examination: Protective sensation intact 5/5 intact bilaterally with 10g monofilament b/l. Vibratory sensation intact b/l.  Assessment: 1. Pain due to onychomycosis of toenails of both feet   2. Hallux valgus, acquired, bilateral   3. Pes planus of both feet   4. Type 2 diabetes mellitus without complication, without long-term current use of insulin (HCC)   5. Encounter for diabetic foot exam (HCC)      ADA Risk Categorization: Low Risk :  Patient has all of the following: Intact protective sensation No prior foot ulcer  No severe deformity Pedal pulses present  Plan: -Examined patient. -Diabetic foot examination performed on today's visit. -Patient to continue soft, supportive shoe gear daily. Start procedure for diabetic shoes. Patient qualifies based on diagnoses. -Toenails 1-5 b/l were debrided in length and girth with sterile nail nippers and dremel without iatrogenic bleeding.  -Patient to report any pedal injuries to medical professional immediately. -Patient/POA to call should there be question/concern in the interim.  Return in about 3 months (around 03/16/2021) for diabetic nail trim.  Freddie Breech, DPM

## 2021-01-05 NOTE — Progress Notes (Deleted)
Office Visit Note  Patient: [name] [name]             Date of Birth: [birth year]           MRN: [id number]             PCP: Andi Devon, MD Referring: Andi Devon, MD Visit Date: 01/19/2021 Occupation: @GUAROCC @  Subjective:  No chief complaint on file.   History of Present Illness: [name] [name] is a 75 y.o. female ***   Activities of Daily Living:  Patient reports morning stiffness for *** {minute/hour:19697}.   Patient {ACTIONS;DENIES/REPORTS:21021675::"Denies"} nocturnal pain.  Difficulty dressing/grooming: {ACTIONS;DENIES/REPORTS:21021675::"Denies"} Difficulty climbing stairs: {ACTIONS;DENIES/REPORTS:21021675::"Denies"} Difficulty getting out of chair: {ACTIONS;DENIES/REPORTS:21021675::"Denies"} Difficulty using hands for taps, buttons, cutlery, and/or writing: {ACTIONS;DENIES/REPORTS:21021675::"Denies"}  No Rheumatology ROS completed.   PMFS History:  Patient Active Problem List   Diagnosis Date Noted  . Overweight 12/17/2020  . Type 2 diabetes mellitus without complication (HCC) 12/17/2020  . Vitamin D deficiency 12/17/2020  . 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 History of hypertension 12/08/2016  . History of diabetes mellitus 12/08/2016  . Dyslipidemia 12/08/2016  . Osteoarthritis of both hands 09/14/2016  . Osteoarthritis of both feet 09/14/2016  . Osteoarthritis of both knees 09/14/2016  . PCP NOTES >>> 08/19/2015  . Low back pain 09/09/2014  . Neuralgia of left saphenous nerve 06/05/2014  . Numbness and tingling in left arm 06/05/2014  . Paresthesia of left arm 05/21/2014  . Gastroparesis 02/20/2013  . Insomnia 02/20/2013  . Annual physical exam 03/31/2012  . History of diverticulitis 04/14/2010  . Osteopenia 05/14/2009  . DYSPHAGIA UNSPECIFIED 01/10/2009  . 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 Hyperlipidemia 06/14/2007  . Essential hypertension 06/14/2007  . Osteoarthritis 06/14/2007  . DM (diabetes mellitus) (HCC) 01/02/2007    Past  Medical History:  Diagnosis Date  . Bilateral knee pain    Euflexxa x3  . Chest pain 06/2007   CP and QW on EKG: neg stress test   . Diabetes mellitus    w/ neuropathy  . Diverticulitis 2000  . Gout   . Hyperlipidemia   . Hypertension   . Insomnia 02/20/2013  . Osteoarthritis    gout, CPPD--- sees Dr.Davenshwar  . Osteoarthritis of both feet 09/14/2016  . Osteoarthritis of both hands 09/14/2016  . Osteoarthritis of both knees 09/14/2016  . Osteopenia    per DEXA 4/10  . Recurrent boils    h/o MRSA    Family History  Problem Relation Age of Onset  . Hypertension Mother   . Diabetes Mother   . Hypertension Father   . Diabetes Father   . Hypertension Daughter   . Hypertension Sister   . Diabetes Sister   . Coronary artery disease Neg Hx   . Stroke Neg Hx   . Colon cancer Neg Hx   . Breast cancer Neg Hx    Past Surgical History:  Procedure Laterality Date  . CHOLECYSTECTOMY    . TONSILLECTOMY     Social History   Social History Narrative   Lives w/ husband   Patient is married with 3 children.   Patient is right handed.   Patient has college education.   Patient drinks 3-4 daily.   Immunization History  Administered Date(s) Administered  . Influenza Split 08/01/2012  . Influenza Whole 10/14/2009  . Influenza, High Dose Seasonal PF 09/12/2013, 08/19/2015, 09/14/2018  . Influenza,inj,Quad PF,6+ Mos 09/24/2014  . Influenza-Unspecified 10/01/2019  . PFIZER(Purple Top)SARS-COV-2 Vaccination 12/11/2019, 01/01/2020  . Pneumococcal Conjugate-13 01/15/2014  .  Pneumococcal Polysaccharide-23 03/31/2012  . Td 08/14/2010     Objective: Vital Signs: There were no vitals taken for this visit.   Physical Exam   Musculoskeletal Exam: ***  CDAI Exam: CDAI Score: -- Patient Global: --; Provider Global: -- Swollen: --; Tender: -- Joint Exam 01/19/2021   No joint exam has been documented for this visit   There is currently no information documented on the  homunculus. Go to the Rheumatology activity and complete the homunculus joint exam.  Investigation: No additional findings.  Imaging: No results found.  Recent Labs: Lab Results  Component Value Date   WBC 9.9 08/27/2020   HGB 11.3 (L) 08/27/2020   PLT 372 08/27/2020   NA 141 10/14/2020   K 3.1 (L) 10/14/2020   CL 96 (L) 10/14/2020   CO2 35 (H) 10/14/2020   GLUCOSE 289 (H) 10/14/2020   BUN 5 (L) 10/14/2020   CREATININE 0.71 10/14/2020   BILITOT 0.3 10/14/2020   ALKPHOS 122 (H) 11/16/2018   AST 9 (L) 10/14/2020   ALT 6 10/14/2020   PROT 6.5 10/14/2020   ALBUMIN 4.2 11/16/2018   CALCIUM 9.5 10/14/2020   GFRAA 98 10/14/2020    Speciality Comments: No specialty comments available.  Procedures:  No procedures performed Allergies: Colchicine, Hydrocodone, Sulindac, Tramadol, Celecoxib, and Glimepiride   Assessment / Plan:     Visit Diagnoses: No diagnosis found.  Orders: No orders of the defined types were placed in this encounter.  No orders of the defined types were placed in this encounter.   Face-to-face time spent with patient was *** minutes. Greater than 50% of time was spent in counseling and coordination of care.  Follow-Up Instructions: No follow-ups on file.   Ellen Henri, CMA  Note - This record has been created using Animal nutritionist.  Chart creation errors have been sought, but may not always  have been located. Such creation errors do not reflect on  the standard of medical care.

## 2021-01-07 ENCOUNTER — Ambulatory Visit: Payer: Medicare Other | Admitting: Orthotics

## 2021-01-19 ENCOUNTER — Ambulatory Visit: Payer: Medicare Other | Admitting: Physician Assistant

## 2021-01-19 DIAGNOSIS — M170 Bilateral primary osteoarthritis of knee: Secondary | ICD-10-CM

## 2021-01-19 DIAGNOSIS — Z8679 Personal history of other diseases of the circulatory system: Secondary | ICD-10-CM

## 2021-01-19 DIAGNOSIS — Z8739 Personal history of other diseases of the musculoskeletal system and connective tissue: Secondary | ICD-10-CM

## 2021-01-19 DIAGNOSIS — M8589 Other specified disorders of bone density and structure, multiple sites: Secondary | ICD-10-CM

## 2021-01-19 DIAGNOSIS — Z8719 Personal history of other diseases of the digestive system: Secondary | ICD-10-CM

## 2021-01-19 DIAGNOSIS — M19072 Primary osteoarthritis, left ankle and foot: Secondary | ICD-10-CM

## 2021-01-19 DIAGNOSIS — Z8639 Personal history of other endocrine, nutritional and metabolic disease: Secondary | ICD-10-CM

## 2021-01-19 DIAGNOSIS — Z87898 Personal history of other specified conditions: Secondary | ICD-10-CM

## 2021-01-19 DIAGNOSIS — M7061 Trochanteric bursitis, right hip: Secondary | ICD-10-CM

## 2021-01-19 DIAGNOSIS — M19041 Primary osteoarthritis, right hand: Secondary | ICD-10-CM

## 2021-02-03 DIAGNOSIS — M19011 Primary osteoarthritis, right shoulder: Secondary | ICD-10-CM | POA: Insufficient documentation

## 2021-04-01 ENCOUNTER — Ambulatory Visit: Payer: Medicare Other | Admitting: Podiatry

## 2021-06-02 ENCOUNTER — Other Ambulatory Visit: Payer: Self-pay

## 2021-06-02 ENCOUNTER — Ambulatory Visit (INDEPENDENT_AMBULATORY_CARE_PROVIDER_SITE_OTHER): Payer: Medicare Other | Admitting: Podiatry

## 2021-06-02 ENCOUNTER — Encounter: Payer: Self-pay | Admitting: Podiatry

## 2021-06-02 DIAGNOSIS — M79675 Pain in left toe(s): Secondary | ICD-10-CM

## 2021-06-02 DIAGNOSIS — M79674 Pain in right toe(s): Secondary | ICD-10-CM

## 2021-06-02 DIAGNOSIS — B351 Tinea unguium: Secondary | ICD-10-CM

## 2021-06-02 DIAGNOSIS — E119 Type 2 diabetes mellitus without complications: Secondary | ICD-10-CM

## 2021-06-02 NOTE — Progress Notes (Signed)
This patient returns to my office for at risk foot care.  This patient requires this care by a professional since this patient will be at risk due to having type 2 diabetes.  This patient is unable to cut nails herself since the patient cannot reach her nails.These nails are painful walking and wearing shoes.  This patient presents for at risk foot care today. She presents to the office five months after her last visit and with her daughter.  General Appearance  Alert, conversant and in no acute stress.  Vascular  Dorsalis pedis and posterior tibial  pulses are palpable  bilaterally.  Capillary return is within normal limits  bilaterally. Temperature is within normal limits  bilaterally.  Neurologic  Senn-Weinstein monofilament wire test within normal limits  bilaterally. Muscle power within normal limits bilaterally.  Nails Thick disfigured discolored nails with subungual debris  from hallux to fifth toes bilaterally. No evidence of bacterial infection or drainage bilaterally.  Orthopedic  No limitations of motion  feet .  No crepitus or effusions noted.  No bony pathology or digital deformities noted.  HAV  B/L.  Pes planus  Skin  normotropic skin with no porokeratosis noted bilaterally.  No signs of infections or ulcers noted.     Onychomycosis  Pain in right toes  Pain in left toes  Consent was obtained for treatment procedures.   Mechanical debridement of nails 1-5  bilaterally performed with a nail nipper.  Filed with dremel without incident.  Upon finishing nail treatment the daughter did not like the appearance of her mothers right big toenail.  She asked me to recheck my work.  I thought it was fine.  I told her that my job was to work on her nails so they were pain free and not to look perfect.   Return office visit    3 months                  Told patient to return for periodic foot care and evaluation due to potential at risk complications.   Helane Gunther DPM

## 2021-09-02 ENCOUNTER — Ambulatory Visit: Payer: Medicare Other | Admitting: Podiatry

## 2021-09-15 ENCOUNTER — Ambulatory Visit (INDEPENDENT_AMBULATORY_CARE_PROVIDER_SITE_OTHER): Payer: Medicare Other | Admitting: Ophthalmology

## 2021-09-15 ENCOUNTER — Other Ambulatory Visit: Payer: Self-pay

## 2021-09-15 ENCOUNTER — Encounter (INDEPENDENT_AMBULATORY_CARE_PROVIDER_SITE_OTHER): Payer: Self-pay | Admitting: Ophthalmology

## 2021-09-15 DIAGNOSIS — E113592 Type 2 diabetes mellitus with proliferative diabetic retinopathy without macular edema, left eye: Secondary | ICD-10-CM | POA: Insufficient documentation

## 2021-09-15 DIAGNOSIS — H4312 Vitreous hemorrhage, left eye: Secondary | ICD-10-CM | POA: Diagnosis not present

## 2021-09-15 DIAGNOSIS — H2513 Age-related nuclear cataract, bilateral: Secondary | ICD-10-CM | POA: Diagnosis not present

## 2021-09-15 DIAGNOSIS — H2511 Age-related nuclear cataract, right eye: Secondary | ICD-10-CM | POA: Insufficient documentation

## 2021-09-15 DIAGNOSIS — H43821 Vitreomacular adhesion, right eye: Secondary | ICD-10-CM | POA: Diagnosis not present

## 2021-09-15 NOTE — Assessment & Plan Note (Signed)
Dense cataract OU more pronounced left eye particularly with adherent vitreous hemorrhage to the anterior hyaloid blocking visualization posteriorly.  The patient is very likely going to need visual acuity improving vitrectomy and endolaser left eye however the phakic condition left eye halts this from being safely done.  I do recommend consideration of cataract extraction with intraocular lens placement left eye first follow-up within 1 to 2 weeks thereafter for vitrectomy and removal of the hyaloid face and treatment of the underlying condition left eye

## 2021-09-15 NOTE — Progress Notes (Signed)
09/15/2021     CHIEF COMPLAINT Patient presents for  Chief Complaint  Patient presents with   Retina Evaluation      HISTORY OF PRESENT ILLNESS: [name] [name] is a 75 y.o. female who presents to the clinic today for:   HPI     Retina Evaluation   In left eye.  This started 3 days ago.  Duration of 3 days.  Associated Symptoms: Decreased vision.  Context:  distance vision, mid-range vision and near vision.  Treatments tried include no treatments.        Comments   Pt is diabetic. Diagnosed for ~ 50 years.  Poor vision to this level, onset some "months" left eye and for the left eye      Last edited by Edmon Crape, MD on 09/15/2021 11:04 AM.      Referring physician: Ernesto Rutherford, MD [address] [place],  [place] [address]  HISTORICAL INFORMATION:   Selected notes from the MEDICAL RECORD NUMBER    Lab Results  Component Value Date   HGBA1C 7.0 (H) 11/16/2018     CURRENT MEDICATIONS: No current outpatient medications on file. (Ophthalmic Drugs)   No current facility-administered medications for this visit. (Ophthalmic Drugs)   Current Outpatient Medications (Other)  Medication Sig   aspirin 81 MG tablet Take 81 mg by mouth daily.   Cholecalciferol (VITAMIN D) 2000 UNITS CAPS Take 2,000 Units by mouth 2 (two) times a week. Reported on 11/18/2015   colchicine 0.6 MG tablet Take a half tablet (0.3 mg total) by mouth daily.   diltiazem (TIAZAC) 300 MG 24 hr capsule TAKE 1 CAPSULE BY MOUTH EVERY DAY (Patient taking differently: Take 300 mg by mouth daily.)   glucose blood (ONETOUCH VERIO) test strip USE TO TEST BLOOD SUGAR 3 TIMES DAILY   SitaGLIPtin-MetFORMIN HCl (JANUMET XR) 50-500 MG TB24 Take 2 tablets by mouth daily.   trolamine salicylate (ASPERCREME) 10 % cream Apply 1 application topically as needed for muscle pain.   No current facility-administered medications for this visit. (Other)      REVIEW OF SYSTEMS:    ALLERGIES Allergies   Allergen Reactions   Colchicine Nausea Only   Hydrocodone Other (See Comments)   Sulindac Other (See Comments)   Tramadol Other (See Comments)    "Made me feel like I was in another world."   Celecoxib Rash   Glimepiride Rash    PAST MEDICAL HISTORY Past Medical History:  Diagnosis Date   Bilateral knee pain    Euflexxa x3   Chest pain 06/2007   CP and QW on EKG: neg stress test    Diabetes mellitus    w/ neuropathy   Diverticulitis 2000   Gout    Hyperlipidemia    Hypertension    Insomnia 02/20/2013   Osteoarthritis    gout, CPPD--- sees Dr.Davenshwar   Osteoarthritis of both feet 09/14/2016   Osteoarthritis of both hands 09/14/2016   Osteoarthritis of both knees 09/14/2016   Osteopenia    per DEXA 4/10   Recurrent boils    h/o MRSA   Past Surgical History:  Procedure Laterality Date   CHOLECYSTECTOMY     TONSILLECTOMY      FAMILY HISTORY Family History  Problem Relation Age of Onset   Hypertension Mother    Diabetes Mother    Hypertension Father    Diabetes Father    Hypertension Daughter    Hypertension Sister    Diabetes Sister  Coronary artery disease Neg Hx    Stroke Neg Hx    Colon cancer Neg Hx    Breast cancer Neg Hx     SOCIAL HISTORY Social History   Tobacco Use   Smoking status: Never   Smokeless tobacco: Never  Vaping Use   Vaping Use: Never used  Substance Use Topics   Alcohol use: No   Drug use: No         OPHTHALMIC EXAM:  Base Eye Exam     Visual Acuity (ETDRS)       Right Left   Dist cc 20/25 -2 HM   Dist ph cc  NI    Correction: Glasses         Tonometry (Tonopen, 10:12 AM)       Right Left   Pressure 16 12         Pupils       Dark   Right DILATED   Left DILATED         Visual Fields (Counting fingers)       Left Right     Full   Restrictions Total superior temporal, inferior temporal, superior nasal, inferior nasal deficiencies          Extraocular Movement       Right Left     Full, Ortho Full, Ortho         Neuro/Psych     Oriented x3: Yes   Mood/Affect: Normal         Dilation     Both eyes: 1.0% Mydriacyl, 2.5% Phenylephrine @ 10:12 AM           Slit Lamp and Fundus Exam     External Exam       Right Left   External Normal Normal         Slit Lamp Exam       Right Left   Lids/Lashes Normal Normal   Conjunctiva/Sclera White and quiet White and quiet   Cornea Clear Clear   Anterior Chamber Deep and quiet Deep and quiet   Iris Round and reactive Round and reactive, no NVI   Lens 3+ Nuclear sclerosis 3+ Nuclear sclerosis   Anterior Vitreous Normal 3+ Cells, RBCs         Fundus Exam       Right Left   Posterior Vitreous Normal 3-4+ RBCs   Disc Normal No view posteriorly   C/D Ratio 0.75    Macula Normal    Vessels Normal No view posteriorly   Periphery Normal             IMAGING AND PROCEDURES  Imaging and Procedures for 09/15/21  OCT, Retina - OU - Both Eyes       Right Eye Quality was good. Scan locations included subfoveal. Central Foveal Thickness: 227. Findings include vitreomacular adhesion , vitreous traction.   Notes OD with minor inner foveal retinal distortion from focal vitreous traction.  No impact on acuity observe  OS no views     Color Fundus Photography Optos - OU - Both Eyes       Right Eye Progression has no prior data. Disc findings include increased cup to disc ratio. Macula : microaneurysms. Vessels : normal observations. Periphery : normal observations.   Left Eye Progression has no prior data.   Notes OS no view through dense medial opacity vitreous hemorrhage 4+  OD no real significant retinopathy seen     B-Scan Ultrasound - OS -  Left Eye       Quality was good. Findings included vitreous hemorrhage, vitreous opacities.   Notes OS with dense vitreous hemorrhage.  Partial posterior vitreous detachment and no retinal detachment nor retinal holes seen.              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  The patient is very likely going to need visual acuity improving vitrectomy and endolaser left eye however the phakic condition left eye halts this from being safely done.  I do recommend consideration of cataract extraction with intraocular lens placement left eye first follow-up within 1 to 2 weeks thereafter for vitrectomy and removal of the hyaloid face and treatment of the underlying condition left eye  Vitreous hemorrhage of left eye (HCC) Technically, vitreous hemorrhage of unknown origin, likely however duration of diabetes this is occult PDR OS.  Nonetheless we will need cataract extraction intraocular displacement first followed thereafter by vitrectomy and endolaser to treat the underlying cause and recover acuity OS     ICD-10-CM   1. Vitreous hemorrhage of left eye (HCC)  H43.12 Color Fundus Photography Optos - OU - Both Eyes    B-Scan Ultrasound - OS - Left Eye    2. Vitreomacular adhesion of right eye  H43.821 OCT, Retina - OU - Both Eyes    3. Proliferative diabetic retinopathy of left eye associated with type 2 diabetes mellitus, macular edema presence unspecified (HCC)  I77.8242     4. Nuclear sclerotic cataract of both eyes  H25.13       1.  OS with dense vitreous hemorrhage technically of unknown origin likely however from underlying PDR, treatment nave.  Will likely need vitrectomy and endolaser to recover acuity.  B-scan ultrasound confirms the absence of retinal tear and no retinal detachment present.    2.  Dense cataract OS precludes the ability to move the anterior  hyaloid at time of vitrectomy surgery thus I do recommend cataract surgery with intraocular lens placement initially followed thereafter by vitrectomy left eye  3.  Recommend return visit with [facility] and consideration of prompt cataract surgery in the left eye  Ophthalmic Meds Ordered this visit:  No orders of the defined types were placed in this encounter.      Return ,, Post cataract surgery, plan vitrectomy [id number], GSC, LMAC, for Return [facility] for cataract extraction lens placement left eye first,,,.  There are no Patient Instructions on file for this visit.   Explained the diagnoses, plan, and follow up with the patient and they expressed understanding.  Patient expressed understanding of the importance of proper follow up care.   [name] [name] M.D. Diseases & Surgery of the Retina and Vitreous [facility] 09/15/21     Abbreviations: M myopia (nearsighted); A astigmatism; H hyperopia (farsighted); P presbyopia; Mrx spectacle prescription;  CTL contact lenses; OD right eye; OS left eye; OU both eyes  XT exotropia; ET esotropia; PEK punctate epithelial keratitis; PEE punctate epithelial erosions; DES dry eye syndrome; MGD meibomian gland dysfunction; ATs artificial tears; PFAT's preservative free artificial tears; NSC nuclear sclerotic cataract; PSC posterior subcapsular cataract; ERM epi-retinal membrane; PVD posterior vitreous detachment; RD retinal detachment; DM diabetes  mellitus; DR diabetic retinopathy; NPDR non-proliferative diabetic retinopathy; PDR proliferative diabetic retinopathy; CSME clinically significant macular edema; DME diabetic macular edema; dbh dot blot hemorrhages; CWS cotton wool spot; POAG primary open angle glaucoma; C/D cup-to-disc ratio; HVF humphrey visual field; GVF goldmann visual field; OCT optical coherence tomography; IOP intraocular pressure; BRVO Branch retinal vein occlusion; CRVO central retinal vein occlusion; CRAO  central retinal artery occlusion; BRAO branch retinal artery occlusion; RT retinal tear; SB scleral buckle; PPV pars plana vitrectomy; VH Vitreous hemorrhage; PRP panretinal laser photocoagulation; IVK intravitreal kenalog; VMT vitreomacular traction; MH Macular hole;  NVD neovascularization of the disc; NVE neovascularization elsewhere; AREDS age related eye disease study; ARMD age related macular degeneration; POAG primary open angle glaucoma; EBMD epithelial/anterior basement membrane dystrophy; ACIOL anterior chamber intraocular lens; IOL intraocular lens; PCIOL posterior chamber intraocular lens; Phaco/IOL phacoemulsification with intraocular lens placement; PRK photorefractive keratectomy; LASIK laser assisted in situ keratomileusis; HTN hypertension; DM diabetes mellitus; COPD chronic obstructive pulmonary disease

## 2021-09-15 NOTE — Assessment & Plan Note (Signed)
Technically, vitreous hemorrhage of unknown origin, likely however duration of diabetes this is occult PDR OS.  Nonetheless we will need cataract extraction intraocular displacement first followed thereafter by vitrectomy and endolaser to treat the underlying cause and recover acuity OS

## 2021-09-15 NOTE — Assessment & Plan Note (Signed)
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

## 2021-09-25 ENCOUNTER — Other Ambulatory Visit: Payer: Self-pay | Admitting: Rheumatology

## 2021-10-02 ENCOUNTER — Encounter (INDEPENDENT_AMBULATORY_CARE_PROVIDER_SITE_OTHER): Payer: Self-pay

## 2021-10-14 ENCOUNTER — Other Ambulatory Visit: Payer: Self-pay

## 2021-10-14 ENCOUNTER — Ambulatory Visit (INDEPENDENT_AMBULATORY_CARE_PROVIDER_SITE_OTHER): Payer: Medicare Other | Admitting: Ophthalmology

## 2021-10-14 ENCOUNTER — Encounter (INDEPENDENT_AMBULATORY_CARE_PROVIDER_SITE_OTHER): Payer: Self-pay | Admitting: Ophthalmology

## 2021-10-14 DIAGNOSIS — H43821 Vitreomacular adhesion, right eye: Secondary | ICD-10-CM | POA: Diagnosis not present

## 2021-10-14 DIAGNOSIS — E113592 Type 2 diabetes mellitus with proliferative diabetic retinopathy without macular edema, left eye: Secondary | ICD-10-CM | POA: Diagnosis not present

## 2021-10-14 DIAGNOSIS — H4312 Vitreous hemorrhage, left eye: Secondary | ICD-10-CM | POA: Diagnosis not present

## 2021-10-14 DIAGNOSIS — H2511 Age-related nuclear cataract, right eye: Secondary | ICD-10-CM

## 2021-10-14 DIAGNOSIS — Z961 Presence of intraocular lens: Secondary | ICD-10-CM

## 2021-10-14 NOTE — Progress Notes (Signed)
10/14/2021     CHIEF COMPLAINT Patient presents for  Chief Complaint  Patient presents with   Retina Follow Up      HISTORY OF PRESENT ILLNESS: [name] [name] is a 75 y.o. female who presents to the clinic today for:   HPI     Retina Follow Up   Patient presents with  Other.  In left eye.  Severity is mild.  Since onset it is stable.        Comments   Post cataract os oct and fp Pt states, "I had cataract surgery in my left eye on 10/01/2021. I am not able to see any better, I do not think. Everything still seems the same, I cannot see good.: A1C: unsure LBS:130 Pt reports using Ofloxacin, Pred and Ketorolac TID OS        Last edited by Demetrios Loll, COA on 10/14/2021  8:41 AM.      Referring physician: Olivia Canter, MD [address] [place],  [place] [address]  HISTORICAL INFORMATION:   Selected notes from the MEDICAL RECORD NUMBER    Lab Results  Component Value Date   HGBA1C 7.0 (H) 11/16/2018     CURRENT MEDICATIONS: No current outpatient medications on file. (Ophthalmic Drugs)   No current facility-administered medications for this visit. (Ophthalmic Drugs)   Current Outpatient Medications (Other)  Medication Sig   aspirin 81 MG tablet Take 81 mg by mouth daily.   Cholecalciferol (VITAMIN D) 2000 UNITS CAPS Take 2,000 Units by mouth 2 (two) times a week. Reported on 11/18/2015   colchicine 0.6 MG tablet Take a half tablet (0.3 mg total) by mouth daily.   diltiazem (TIAZAC) 300 MG 24 hr capsule TAKE 1 CAPSULE BY MOUTH EVERY DAY (Patient taking differently: Take 300 mg by mouth daily.)   glucose blood (ONETOUCH VERIO) test strip USE TO TEST BLOOD SUGAR 3 TIMES DAILY   SitaGLIPtin-MetFORMIN HCl (JANUMET XR) 50-500 MG TB24 Take 2 tablets by mouth daily.   trolamine salicylate (ASPERCREME) 10 % cream Apply 1 application topically as needed for muscle pain.   No current facility-administered medications for this visit. 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   Osteoarthritis of both feet 09/14/2016   Osteoarthritis of both hands 09/14/2016   Osteoarthritis of both knees 09/14/2016   Osteopenia    per DEXA 4/10   Recurrent boils    h/o MRSA   Past Surgical History:  Procedure Laterality Date   CHOLECYSTECTOMY     TONSILLECTOMY      FAMILY HISTORY Family History  Problem Relation Age of Onset   Hypertension Mother    Diabetes Mother    Hypertension Father    Diabetes Father  Hypertension Daughter    Hypertension Sister    Diabetes Sister    Coronary artery disease Neg Hx    Stroke Neg Hx    Colon cancer Neg Hx    Breast cancer Neg Hx     SOCIAL HISTORY Social History   Tobacco Use   Smoking status: Never   Smokeless tobacco: Never  Vaping Use   Vaping Use: Never used  Substance Use Topics   Alcohol use: No   Drug use: No         OPHTHALMIC EXAM:  Base Eye Exam     Visual Acuity (ETDRS)       Right Left   Dist cc 20/25 -2 HM    Correction: Glasses         Tonometry (Tonopen, 8:45 AM)       Right Left   Pressure 14 11         Pupils       Pupils Dark Light Shape React APD   Right PERRL 5 4 Round Brisk None   Left PERRL 5 4 Round Brisk None         Visual Fields       Left Right     Full   Restrictions Total superior temporal, inferior temporal, superior nasal,  inferior nasal deficiencies          Extraocular Movement       Right Left    Full Full         Neuro/Psych     Oriented x3: Yes   Mood/Affect: Normal         Dilation     Left eye: 1.0% Mydriacyl, 2.5% Phenylephrine @ 8:45 AM           Slit Lamp and Fundus Exam     External Exam       Right Left   External Normal Normal         Slit Lamp Exam       Right Left   Lids/Lashes Normal Normal   Conjunctiva/Sclera White and quiet White and quiet   Cornea Clear Clear   Anterior Chamber Deep and quiet Deep and quiet   Iris Round and reactive Round and reactive, no NVI   Lens 3+ Nuclear sclerosis Centered posterior chamber intraocular lens   Anterior Vitreous Normal 3+ Cells, RBCs         Fundus Exam       Right Left   Posterior Vitreous Normal 3-4+ RBCs, old white   Disc Normal No view posteriorly   C/D Ratio 0.75    Macula Normal    Vessels Normal No view posteriorly   Periphery Normal             IMAGING AND PROCEDURES  Imaging and Procedures for 10/14/21  Color Fundus Photography Optos - OU - Both Eyes       Right Eye Progression has no prior data. Disc findings include increased cup to disc ratio. Macula : microaneurysms. Vessels : normal observations. Periphery : normal observations.   Left Eye Progression has no prior data.   Notes OS no view through dense medial opacity vitreous hemorrhage 4+  OD no real significant retinopathy seen     B-Scan Ultrasound - OS - Left Eye       Quality was good. Findings included vitreous hemorrhage, vitreous opacities.   Notes OS with dense vitreous hemorrhage.  Partial posterior vitreous detachment and no retinal detachment nor retinal holes seen.  ASSESSMENT/PLAN:  Age-related nuclear cataract, right OD, follow-up with Dr. Dione Booze as scheduled  Vitreous hemorrhage of left eye (HCC) The nature of the vitreous hemorrhage was discussed with the patient as well as the  common causes.   Patients with diabetic eye disease may develop retinal neovascularization.  Other eye conditions develop retinal  neovascularization secondary to retinal venous occlusions.   Vitreous hemorrhage may result from spontaneous vitreous detachment or retinal breaks.  Blunt trauma is a common cause as well.  The need for serial evaluation of the fundus (interior of the eye) until  clear views are obtained was addressed. An occasional need  to monitor the condition by in office  B-scan ultrasonography in the case of dense vitreous hemorrhage was discussed.  Dense nonclearing vitreous hemorrhage but no new hemorrhage seen on clinical examination.  B-scan ultrasound today also confirms there is no sign of retinal hole retinal tear detachment or large neovascular fronds.     ICD-10-CM   1. Vitreous hemorrhage of left eye (HCC)  H43.12 Color Fundus Photography Optos - OU - Both Eyes    B-Scan Ultrasound - OS - Left Eye    CANCELED: OCT, Retina - OU - Both Eyes    2. Vitreomacular adhesion of right eye  H43.821 Color Fundus Photography Optos - OU - Both Eyes    CANCELED: OCT, Retina - OU - Both Eyes    3. 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 Age-related nuclear cataract, right  H25.11     5. Pseudophakia of left eye  Z96.1       1.  OS with dense nonclearing vitreous hemorrhage left eye now with media now clear with pseudophakic status established OS.  We will need and will plan vitrectomy endolaser left eye 1 to 2 weeks in order to clear the visual axis and begin the healing process.  2.  Patient's operative experience reviewed and explained to the patient.  Explained that this hemorrhage is not likely clear on its own and that with surgical intervention we can place the left on the road to  recovery and slow improvement in acuity over the coming days to weeks  3.  Ophthalmic Meds Ordered this visit:  No orders of the defined types were placed in this encounter.      Return ,, [facility], [facility], for Schedule vitrectomy, endolaser PRP left eye-[id number], h43.12, OS.  There are no Patient Instructions on file for this visit.   Explained the diagnoses, plan, and follow up with the patient and they expressed understanding.  Patient expressed understanding of the importance of proper follow up care.   Alford Highland Seve Monette M.D. Diseases & Surgery of the Retina and Vitreous [facility] 10/14/21     Abbreviations: M myopia (nearsighted); A astigmatism; H hyperopia (farsighted); P presbyopia; Mrx spectacle prescription;  CTL contact lenses; OD right eye; OS left eye; OU both eyes  XT exotropia; ET esotropia; PEK punctate epithelial keratitis; PEE punctate epithelial erosions; DES dry eye syndrome; MGD meibomian gland dysfunction; ATs artificial tears; PFAT's preservative free artificial tears; NSC nuclear sclerotic cataract; PSC posterior subcapsular cataract; ERM epi-retinal membrane; PVD posterior vitreous detachment; RD retinal detachment; DM diabetes mellitus; DR diabetic retinopathy; NPDR non-proliferative diabetic retinopathy; PDR proliferative diabetic retinopathy; CSME clinically significant  macular edema; DME diabetic macular edema; dbh dot blot hemorrhages; CWS cotton wool spot; POAG primary open angle glaucoma; C/D cup-to-disc ratio; HVF humphrey visual field; GVF goldmann visual field; OCT optical coherence tomography; IOP intraocular pressure; BRVO Branch retinal vein occlusion; CRVO central retinal vein occlusion; CRAO central retinal artery occlusion; BRAO branch retinal artery occlusion; RT retinal tear; SB scleral buckle; PPV pars plana vitrectomy; VH Vitreous hemorrhage; PRP panretinal laser photocoagulation; IVK intravitreal kenalog; VMT vitreomacular  traction; MH Macular hole;  NVD neovascularization of the disc; NVE neovascularization elsewhere; AREDS age related eye disease study; ARMD age related macular degeneration; POAG primary open angle glaucoma; EBMD epithelial/anterior basement membrane dystrophy; ACIOL anterior chamber intraocular lens; IOL intraocular lens; PCIOL posterior chamber intraocular lens; Phaco/IOL phacoemulsification with intraocular lens placement; PRK photorefractive keratectomy; LASIK laser assisted in situ keratomileusis; HTN hypertension; DM diabetes mellitus; COPD chronic obstructive pulmonary disease

## 2021-10-14 NOTE — Assessment & Plan Note (Addendum)
The nature of the vitreous hemorrhage was discussed with the patient as well as the common causes.   Patients with diabetic eye disease may develop retinal neovascularization.  Other eye conditions develop retinal  neovascularization secondary to retinal venous occlusions.   Vitreous hemorrhage may result from spontaneous vitreous detachment or retinal breaks.  Blunt trauma is a common cause as well.  The need for serial evaluation of the fundus (interior of the eye) until  clear views are obtained was addressed. An occasional need  to monitor the condition by in office  B-scan ultrasonography in the case of dense vitreous hemorrhage was discussed.  Dense nonclearing vitreous hemorrhage but no new hemorrhage seen on clinical examination.  B-scan ultrasound today also confirms there is no sign of retinal hole retinal tear detachment or large neovascular fronds.

## 2021-10-14 NOTE — Assessment & Plan Note (Signed)
OD, follow-up with Dr. Dione Booze as scheduled

## 2021-10-19 ENCOUNTER — Encounter (INDEPENDENT_AMBULATORY_CARE_PROVIDER_SITE_OTHER): Payer: Self-pay | Admitting: Ophthalmology

## 2021-10-19 ENCOUNTER — Other Ambulatory Visit: Payer: Self-pay

## 2021-10-19 ENCOUNTER — Ambulatory Visit (INDEPENDENT_AMBULATORY_CARE_PROVIDER_SITE_OTHER): Payer: Medicare Other | Admitting: Ophthalmology

## 2021-10-19 DIAGNOSIS — Z961 Presence of intraocular lens: Secondary | ICD-10-CM

## 2021-10-19 DIAGNOSIS — E113592 Type 2 diabetes mellitus with proliferative diabetic retinopathy without macular edema, left eye: Secondary | ICD-10-CM | POA: Diagnosis not present

## 2021-10-19 DIAGNOSIS — H4312 Vitreous hemorrhage, left eye: Secondary | ICD-10-CM | POA: Diagnosis not present

## 2021-10-19 MED ORDER — OFLOXACIN 0.3 % OP SOLN: 1.0000 [drp] | OPHTHALMIC | 0 refills | Status: AC

## 2021-10-19 NOTE — Assessment & Plan Note (Signed)
OS, responsible for dense vitreous hemorrhage.  Risk benefits reviewed with the patient and family.

## 2021-10-19 NOTE — Assessment & Plan Note (Signed)
Dense nonclearing vitreous hemorrhage of the left eye now in the pseudophakic left eye.  Plan for vitrectomy evacuation of vitreous hemorrhage delivery of PRP left eye under local MAC anesthesia [facility] in the coming days.  Risk benefits will be reviewed with the patient.  Medications were reviewed with the patient.

## 2021-10-19 NOTE — Patient Instructions (Signed)
1.  GIVE PREOPERATIVE EYE MEDICATION TO PATIENT TO TAKE HOME, AND DO NOT USE UNTIL POSTOPERATIVE VISIT THE NEXT DAY IN DR [facility] OFFICE UNLESS INSTRUCTED BY PHYSICIAN TO START THE NEXT DAY.   POSITIONS:  (if gas bubble placed into the eye)  A.  Do not sleep or rest on back  B.  Do not travel by airplane, to the mountains, or elevation above 2000 feet    The patient understands the risks of anesthesia including but not limited to the rare occurrence of death. The patient also understands risks and benefits of the planned surgical procedure include but are not limited to hemorrhage, infection, scarring, loss of vision, progression of disease despite intervention, and need for another procedure.    Patient instructed to be n.p.o., that is eating and drinking nothing more than sips of water to take normal scheduled medication after midnight, the night before surgery.  Patient may take normal medications in the morning with very small sips of water  Patient instructed to follow the anesthesia orders regarding medication use particularly those medications use for diabetes control

## 2021-10-19 NOTE — Assessment & Plan Note (Signed)
Clear anterior segment anticipation of vitrectomy PRP left eye

## 2021-10-19 NOTE — Progress Notes (Signed)
10/19/2021     CHIEF COMPLAINT Patient presents for No chief complaint on file.     HISTORY OF PRESENT ILLNESS: [name] [name] is a 76 y.o. female who presents to the clinic today for:     Referring physician: Willey Blade, [place],   [id number]  HISTORICAL INFORMATION:   Selected notes from the [place]    Lab Results  Component Value Date   HGBA1C 7.0 (H) 11/16/2018     CURRENT MEDICATIONS: No current outpatient medications on file. (Ophthalmic Drugs)   No current facility-administered medications for this visit. (Ophthalmic Drugs)   Current Outpatient Medications (Other)  Medication Sig   aspirin 81 MG tablet Take 81 mg by mouth daily.   Cholecalciferol (VITAMIN D) 2000 UNITS CAPS Take 2,000 Units by mouth 2 (two) times a week. Reported on 11/18/2015   colchicine 0.6 MG tablet Take a half tablet (0.3 mg total) by mouth daily.   diltiazem (TIAZAC) 300 MG 24 hr capsule TAKE 1 CAPSULE BY MOUTH EVERY DAY (Patient taking differently: Take 300 mg by mouth daily.)   glucose blood (ONETOUCH VERIO) test strip USE TO TEST BLOOD SUGAR 3 TIMES DAILY   SitaGLIPtin-MetFORMIN HCl (JANUMET XR) 50-500 MG TB24 Take 2 tablets by mouth daily.   trolamine salicylate (ASPERCREME) 10 % cream Apply 1 application topically as needed for muscle pain.   No current facility-administered medications for this visit. (Other)      REVIEW OF SYSTEMS:    ALLERGIES Allergies  Allergen Reactions   Colchicine Nausea Only   Hydrocodone Other (See Comments)   Sulindac Other (See Comments)   Tramadol Other (See Comments)    "Made me feel like I was in another world."   Celecoxib Rash   Glimepiride Rash    PAST MEDICAL HISTORY Past Medical History:  Diagnosis Date   Bilateral knee pain    Euflexxa x3   Chest pain 06/2007   CP and QW on EKG: neg stress test    Diabetes mellitus    w/ neuropathy   Diverticulitis 2000   Gout     Hyperlipidemia    Hypertension    Insomnia 02/20/2013   Osteoarthritis    gout, CPPD--- sees Dr.Davenshwar   Osteoarthritis of both feet 09/14/2016   Osteoarthritis of both hands 09/14/2016   Osteoarthritis of both knees 09/14/2016   Osteopenia    per DEXA 4/10   Recurrent boils    h/o MRSA   Past Surgical History:  Procedure Laterality Date   CHOLECYSTECTOMY     TONSILLECTOMY      FAMILY HISTORY Family History  Problem Relation Age of Onset   Hypertension Mother    Diabetes Mother    Hypertension Father    Diabetes Father    Hypertension Daughter    Hypertension Sister    Diabetes Sister    Coronary artery disease Neg Hx    Stroke Neg Hx    Colon cancer Neg Hx    Breast cancer Neg Hx     SOCIAL HISTORY Social History   Tobacco Use   Smoking status: Never   Smokeless tobacco: Never  Vaping Use   Vaping Use: Never used  Substance Use Topics   Alcohol use: No   Drug use: No         OPHTHALMIC EXAM:  Base Eye Exam     Visual Acuity (ETDRS)       Right Left   Dist cc 20/30  HM         Tonometry ([place], 8:17 AM)       Right Left   Pressure 18 18         Pupils       Pupils APD   Right PERRL None   Left PERRL None         Visual Fields       Left Right   Restrictions Total superior temporal, inferior temporal, superior nasal, inferior nasal deficiencies          Neuro/Psych     Oriented x3: Yes   Mood/Affect: Normal           Slit Lamp and Fundus Exam     External Exam       Right Left   External Normal Normal         Slit Lamp Exam       Right Left   Lids/Lashes Normal Normal   Conjunctiva/Sclera White and quiet White and quiet   Cornea Clear Clear   Anterior Chamber Deep and quiet Deep and quiet   Iris Round and reactive Round and reactive, no NVI   Lens 3+ Nuclear sclerosis Centered posterior chamber intraocular lens   Anterior Vitreous Normal 3+ Cells, RBCs         Fundus Exam       Right Left    Posterior Vitreous  3-4+ RBCs, old white   Disc  No view posteriorly   Vessels  No view posteriorly            IMAGING AND PROCEDURES  Imaging and Procedures for 10/19/21           ASSESSMENT/PLAN:  Vitreous hemorrhage of left eye (HCC) Dense nonclearing vitreous hemorrhage of the left eye now in the pseudophakic left eye.  Plan for vitrectomy evacuation of vitreous hemorrhage delivery of PRP left eye under local MAC anesthesia [facility] in the coming days.  Risk benefits will be reviewed with the patient.  Medications were reviewed with the patient.  Proliferative diabetic retinopathy, left eye (HCC) OS, responsible for dense vitreous hemorrhage.  Risk benefits reviewed with the patient and family.  Pseudophakia of left eye Clear anterior segment anticipation of vitrectomy PRP left eye     ICD-10-CM   1. Vitreous hemorrhage of left eye (HCC)  H43.12     2. 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 Pseudophakia of left eye  Z96.1       1.  2.  3.  Ophthalmic Meds Ordered this visit:  No orders of the defined types were placed in this encounter.      Return ,,, [facility], [facility], for Surgical vitrectomy with PRP scheduled 10-21-2021, [id number], OS.  Patient Instructions  1.  GIVE PREOPERATIVE EYE MEDICATION TO PATIENT TO TAKE HOME, AND DO NOT USE UNTIL POSTOPERATIVE VISIT THE NEXT DAY IN DR [facility] OFFICE UNLESS INSTRUCTED BY PHYSICIAN TO START THE NEXT DAY.   POSITIONS:  (if gas bubble placed into the eye)  A.  Do not sleep or rest on back  B.  Do not travel by airplane, to the mountains, or elevation above 2000 feet    The patient understands the risks of anesthesia including but not limited to the rare occurrence of death. The patient also understands risks and benefits of the planned surgical procedure include but  are not limited  to hemorrhage, infection, scarring, loss of vision, progression of disease despite intervention, and need for another procedure.    Patient instructed to be n.p.o., that is eating and drinking nothing more than sips of water to take normal scheduled medication after midnight, the night before surgery.  Patient may take normal medications in the morning with very small sips of water  Patient instructed to follow the anesthesia orders regarding medication use particularly those medications use for diabetes control   Explained the diagnoses, plan, and follow up with the patient and they expressed understanding.  Patient expressed understanding of the importance of proper follow up care.   Clent Demark Panagiotis Oelkers M.D. Diseases & Surgery of the Retina and Vitreous [facility] 10/19/21     Abbreviations: M myopia (nearsighted); A astigmatism; H hyperopia (farsighted); P presbyopia; Mrx spectacle prescription;  CTL contact lenses; OD right eye; OS left eye; OU both eyes  XT exotropia; ET esotropia; PEK punctate epithelial keratitis; PEE punctate epithelial erosions; DES dry eye syndrome; MGD meibomian gland dysfunction; ATs artificial tears; PFAT's preservative free artificial tears; [place] nuclear sclerotic cataract; PSC posterior subcapsular cataract; ERM epi-retinal membrane; PVD posterior vitreous detachment; RD retinal detachment; DM diabetes mellitus; DR diabetic retinopathy; NPDR non-proliferative diabetic retinopathy; PDR proliferative diabetic retinopathy; CSME clinically significant macular edema; DME diabetic macular edema; dbh dot blot hemorrhages; CWS cotton wool spot; POAG primary open angle glaucoma; C/D cup-to-disc ratio; HVF humphrey visual field; GVF goldmann visual field; OCT optical coherence tomography; IOP intraocular pressure; BRVO Branch retinal vein occlusion; CRVO central retinal vein occlusion; CRAO central retinal artery occlusion; BRAO branch retinal  artery occlusion; RT retinal tear; SB scleral buckle; PPV pars plana vitrectomy; VH Vitreous hemorrhage; PRP panretinal laser photocoagulation; IVK intravitreal kenalog; VMT vitreomacular traction; MH Macular hole;  NVD neovascularization of the disc; NVE neovascularization elsewhere; AREDS age related eye disease study; ARMD age related macular degeneration; POAG primary open angle glaucoma; EBMD epithelial/anterior basement membrane dystrophy; ACIOL anterior chamber intraocular lens; IOL intraocular lens; PCIOL posterior chamber intraocular lens; Phaco/IOL phacoemulsification with intraocular lens placement; [place] photorefractive keratectomy; LASIK laser assisted in situ keratomileusis; HTN hypertension; DM diabetes mellitus; COPD chronic obstructive pulmonary disease

## 2021-10-20 ENCOUNTER — Ambulatory Visit (INDEPENDENT_AMBULATORY_CARE_PROVIDER_SITE_OTHER): Payer: Medicare Other

## 2021-10-28 ENCOUNTER — Encounter (AMBULATORY_SURGERY_CENTER): Payer: Medicare Other | Admitting: Ophthalmology

## 2021-10-28 DIAGNOSIS — H4312 Vitreous hemorrhage, left eye: Secondary | ICD-10-CM

## 2021-10-28 DIAGNOSIS — E113592 Type 2 diabetes mellitus with proliferative diabetic retinopathy without macular edema, left eye: Secondary | ICD-10-CM

## 2021-10-29 ENCOUNTER — Other Ambulatory Visit: Payer: Self-pay

## 2021-10-29 ENCOUNTER — Encounter (INDEPENDENT_AMBULATORY_CARE_PROVIDER_SITE_OTHER): Payer: Self-pay | Admitting: Ophthalmology

## 2021-10-29 ENCOUNTER — Ambulatory Visit (INDEPENDENT_AMBULATORY_CARE_PROVIDER_SITE_OTHER): Payer: Medicare Other | Admitting: Ophthalmology

## 2021-10-29 DIAGNOSIS — H4312 Vitreous hemorrhage, left eye: Secondary | ICD-10-CM

## 2021-10-29 DIAGNOSIS — E113592 Type 2 diabetes mellitus with proliferative diabetic retinopathy without macular edema, left eye: Secondary | ICD-10-CM

## 2021-10-29 NOTE — Progress Notes (Signed)
10/29/2021     CHIEF COMPLAINT Patient presents for  Chief Complaint  Patient presents with   Post-op Follow-up      HISTORY OF PRESENT ILLNESS: [name] [name] is a 75 y.o. female who presents to the clinic today for:   HPI     Post-op Follow-up           Laterality: left eye   Discomfort: Negative for pain, itching, foreign body sensation, tearing, discharge and floaters   Vision: is improved         Comments   1 day PO VITRECTOMY PRP FOR PDR WITH DENSE NONCLEARING VITREOUS HEMORRHAGE OS sx 10/28/2021. Pt states her vision has improved. Denies FOL or floaters. Pt is using the prednisolone and ofloxacin QID OS.  "I can see"      Last edited by Edmon Crape, MD on 10/29/2021  8:47 AM.      Referring physician: Sallye Lat, MD [address] [place],  [place] [address]  HISTORICAL INFORMATION:   Selected notes from the MEDICAL RECORD NUMBER    Lab Results  Component Value Date   HGBA1C 7.0 (H) 11/16/2018     CURRENT MEDICATIONS: Current Outpatient Medications (Ophthalmic Drugs)  Medication Sig   ofloxacin (OCUFLOX) 0.3 % ophthalmic solution Place 1 drop into the left eye every 4 (four) hours for 10 days.   No current facility-administered medications for this visit. (Ophthalmic Drugs)   Current Outpatient Medications (Other)  Medication Sig   aspirin 81 MG tablet Take 81 mg by mouth daily.   Cholecalciferol (VITAMIN D) 2000 UNITS CAPS Take 2,000 Units by mouth 2 (two) times a week. Reported on 11/18/2015   colchicine 0.6 MG tablet Take a half tablet (0.3 mg total) by mouth daily.   diltiazem (TIAZAC) 300 MG 24 hr capsule TAKE 1 CAPSULE BY MOUTH EVERY DAY (Patient taking differently: Take 300 mg by mouth daily.)   glucose blood (ONETOUCH VERIO) test strip USE TO TEST BLOOD SUGAR 3 TIMES DAILY   SitaGLIPtin-MetFORMIN HCl (JANUMET XR) 50-500 MG TB24 Take 2 tablets by mouth daily.   trolamine salicylate (ASPERCREME) 10 % cream Apply 1  application topically as needed for muscle pain.   No current facility-administered medications for this visit. 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   Osteoarthritis of both feet 09/14/2016   Osteoarthritis of both hands 09/14/2016   Osteoarthritis of both knees 09/14/2016   Osteopenia    per DEXA 4/10   Recurrent boils    h/o MRSA   Past Surgical History:  Procedure Laterality Date   CHOLECYSTECTOMY     TONSILLECTOMY      FAMILY HISTORY Family History  Problem Relation Age of Onset   Hypertension  Mother    Diabetes Mother    Hypertension Father    Diabetes Father    Hypertension Daughter    Hypertension Sister    Diabetes Sister    Coronary artery disease Neg Hx    Stroke Neg Hx    Colon cancer Neg Hx    Breast cancer Neg Hx     SOCIAL HISTORY Social History   Tobacco Use   Smoking status: Never   Smokeless tobacco: Never  Vaping Use   Vaping Use: Never used  Substance Use Topics   Alcohol use: No   Drug use: No         OPHTHALMIC EXAM:  Base Eye Exam     Visual Acuity (Snellen - Linear)       Right Left   Dist [place] 20/30 -1 20/30 -1         Tonometry (Tonopen, 8:22 AM)       Right Left   Pressure 21 20         Pupils       Dark Light Shape React APD   Right 4 3 Round Brisk None   Left [employer]. Dilated    None         Visual Fields (Counting  fingers)       Left Right    Full Full         Extraocular Movement       Right Left    Full Full         Neuro/Psych     Oriented x3: Yes   Mood/Affect: Normal         Dilation     Left eye: 1.0% Mydriacyl, 2.5% Phenylephrine @ 8:22 AM           Slit Lamp and Fundus Exam     External Exam       Right Left   External Normal Normal         Slit Lamp Exam       Right Left   Lids/Lashes Normal Normal   Conjunctiva/Sclera White and quiet White and quiet   Cornea Clear Clear   Anterior Chamber Deep and quiet Deep and quiet   Iris Round and reactive Round and reactive, no NVI   Lens 3+ Nuclear sclerosis Centered posterior chamber intraocular lens   Anterior Vitreous Normal 3+ Cells, RBCs         Fundus Exam       Right Left   Posterior Vitreous  Clear, avitric   Macula  Macula attached   Vessels  Very severe NPDR peripherally   Periphery  Good peripheral anterior PRP 360            IMAGING AND PROCEDURES  Imaging and Procedures for 10/29/21           ASSESSMENT/PLAN:  Vitreous hemorrhage of left eye (HCC) Looks great OS today clear vision, count fingers vision now improved to 20/30 OS      ICD-10-CM   1. 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 Vitreous hemorrhage of left eye (HCC)  H43.12       1.  2.  3.  Ophthalmic Meds Ordered this visit:  No orders of the defined types were placed in this encounter.      Return in about 1 week (around 11/05/2021) for POST OP, dilate, OS, COLOR FP, OCT.  Patient Instructions  Ofloxacin  4 times daily to the operative eye  Prednisolone acetate 1 drop to the operative eye 4 times daily  Patient instructed not to refill the medications and use them for maximum of 3 weeks.  Patient instructed do not rub the eye.  Patient has the option to use the patch at night.   Explained the  diagnoses, plan, and follow up with the patient and they expressed understanding.  Patient expressed understanding of the importance of proper follow up care.   Alford Highland Zanaria Morell M.D. Diseases & Surgery of the Retina and Vitreous [facility] 10/29/21     Abbreviations: M myopia (nearsighted); A astigmatism; H hyperopia (farsighted); P presbyopia; Mrx spectacle prescription;  CTL contact lenses; OD right eye; OS left eye; OU both eyes  XT exotropia; ET esotropia; PEK punctate epithelial keratitis; PEE punctate epithelial erosions; DES dry eye syndrome; MGD meibomian gland dysfunction; ATs artificial tears; PFAT's preservative free artificial tears; NSC nuclear sclerotic cataract; PSC posterior subcapsular cataract; ERM epi-retinal membrane; PVD posterior vitreous detachment; RD retinal detachment; DM diabetes mellitus; DR diabetic retinopathy; NPDR non-proliferative diabetic retinopathy; PDR proliferative diabetic retinopathy; CSME clinically significant macular edema; DME diabetic macular edema; dbh dot blot hemorrhages; CWS cotton wool spot; POAG primary open angle glaucoma; C/D cup-to-disc ratio; HVF humphrey visual field; GVF goldmann visual field; OCT optical coherence tomography; IOP intraocular pressure; BRVO Branch retinal vein occlusion; CRVO central retinal vein occlusion; CRAO central retinal artery occlusion; BRAO branch retinal artery occlusion; RT retinal tear; SB scleral buckle; PPV pars plana vitrectomy; VH Vitreous hemorrhage; PRP panretinal laser photocoagulation; IVK intravitreal kenalog; VMT vitreomacular traction; MH Macular hole;  NVD neovascularization of the disc; NVE neovascularization elsewhere; AREDS age related eye disease study; ARMD age related macular degeneration; POAG primary open angle glaucoma; EBMD epithelial/anterior basement membrane dystrophy; ACIOL anterior chamber intraocular lens; IOL intraocular lens; PCIOL posterior chamber intraocular lens;  Phaco/IOL phacoemulsification with intraocular lens placement; PRK photorefractive keratectomy; LASIK laser assisted in situ keratomileusis; HTN hypertension; DM diabetes mellitus; COPD chronic obstructive pulmonary disease

## 2021-10-29 NOTE — Patient Instructions (Signed)
Ofloxacin  4 times daily to the operative eye  Prednisolone acetate 1 drop to the operative eye 4 times daily  Patient instructed not to refill the medications and use them for maximum of 3 weeks.  Patient instructed do not rub the eye.  Patient has the option to use the patch at night. 

## 2021-10-29 NOTE — Assessment & Plan Note (Signed)
Looks great OS today clear vision, count fingers vision now improved to 20/30 OS

## 2021-11-05 ENCOUNTER — Other Ambulatory Visit: Payer: Self-pay

## 2021-11-05 ENCOUNTER — Encounter (INDEPENDENT_AMBULATORY_CARE_PROVIDER_SITE_OTHER): Payer: Medicare Other | Admitting: Ophthalmology

## 2021-11-05 ENCOUNTER — Encounter (INDEPENDENT_AMBULATORY_CARE_PROVIDER_SITE_OTHER): Payer: Self-pay | Admitting: Ophthalmology

## 2021-11-05 ENCOUNTER — Ambulatory Visit (INDEPENDENT_AMBULATORY_CARE_PROVIDER_SITE_OTHER): Payer: Medicare Other | Admitting: Ophthalmology

## 2021-11-05 DIAGNOSIS — H4312 Vitreous hemorrhage, left eye: Secondary | ICD-10-CM

## 2021-11-05 DIAGNOSIS — R0683 Snoring: Secondary | ICD-10-CM | POA: Insufficient documentation

## 2021-11-05 NOTE — Patient Instructions (Signed)
Ofloxacin  4 times daily to the operative eye  Prednisolone acetate 1 drop to the operative eye 4 times daily  Patient instructed not to refill the medications and use them for maximum of 3 weeks.  Patient instructed do not rub the eye.  Patient has the option to use the patch at night. 

## 2021-11-05 NOTE — Assessment & Plan Note (Signed)
Occasional nocturia, may have sleep apnea patient encouraged to check at this condition with home sleep study via PCP Dr. Andi Devon

## 2021-11-05 NOTE — Assessment & Plan Note (Signed)
1 week postop vitrectomy for possible PDR related disease, now with good peripheral anterior PRP to possible ischemic areas.

## 2021-11-05 NOTE — Progress Notes (Signed)
11/05/2021     CHIEF COMPLAINT Patient presents for  Chief Complaint  Patient presents with   Post-op Follow-up      HISTORY OF PRESENT ILLNESS: [name] [name] is a 75 y.o. female who presents to the clinic today for:   HPI     Post-op Follow-up           Laterality: left eye   Discomfort: Negative for pain, itching, foreign body sensation, tearing, discharge and floaters   Vision: is improved         Comments   1 week PO dilate OS color FP and and OCT. Pt states she feels like her vision has improved.  Pt is using prednisolone and ofloxacin QID OS. Pt has not instilled drops this morning yet. Pt daughter states [name] [name]'s husband fell yesterday and she tried to help him up and strained a little bit but patient is fine, pt just concerned if it possibly did anything to her eye.      Last edited by Laurin Coder on 11/05/2021  8:24 AM.      Referring physician: Willey Blade, MD [address] [place],  [place] [address]  HISTORICAL INFORMATION:   Selected notes from the MEDICAL RECORD NUMBER    Lab Results  Component Value Date   HGBA1C 7.0 (H) 11/16/2018     CURRENT MEDICATIONS: No current outpatient medications on file. (Ophthalmic Drugs)   No current facility-administered medications for this visit. (Ophthalmic Drugs)   Current Outpatient Medications (Other)  Medication Sig   aspirin 81 MG tablet Take 81 mg by mouth daily.   Cholecalciferol (VITAMIN D) 2000 UNITS CAPS Take 2,000 Units by mouth 2 (two) times a week. Reported on 11/18/2015   colchicine 0.6 MG tablet Take a half tablet (0.3 mg total) by mouth daily.   diltiazem (TIAZAC) 300 MG 24 hr capsule TAKE 1 CAPSULE BY MOUTH EVERY DAY (Patient taking differently: Take 300 mg by mouth daily.)   glucose blood (ONETOUCH VERIO) test strip USE TO TEST BLOOD SUGAR 3 TIMES DAILY   SitaGLIPtin-MetFORMIN HCl (JANUMET XR) 50-500 MG TB24 Take 2 tablets by mouth daily.   trolamine  salicylate (ASPERCREME) 10 % cream Apply 1 application topically as needed for muscle pain.   No current facility-administered medications for this visit. 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   Osteoarthritis of both feet 09/14/2016   Osteoarthritis of both hands 09/14/2016   Osteoarthritis of both knees 09/14/2016   Osteopenia    per DEXA 4/10   Recurrent boils    h/o MRSA   Past Surgical History:  Procedure Laterality Date   CHOLECYSTECTOMY     TONSILLECTOMY      FAMILY HISTORY Family History  Problem Relation Age of Onset   Hypertension Mother    Diabetes Mother    Hypertension Father    Diabetes Father    Hypertension Daughter    Hypertension Sister    Diabetes Sister    Coronary artery disease Neg Hx    Stroke Neg Hx    Colon cancer Neg Hx    Breast cancer Neg Hx     SOCIAL HISTORY Social History   Tobacco Use   Smoking status: Never   Smokeless tobacco: Never  Vaping Use   Vaping Use: Never used  Substance Use Topics   Alcohol use: No   Drug use: No         OPHTHALMIC EXAM:  Base Eye Exam     Visual Acuity (ETDRS)       Right Left   Dist [place] 20/25 -2 20/25         Tonometry (Tonopen, 8:25 AM)       Right Left   Pressure 15 15         Pupils       Pupils Dark Light React APD   Right PERRL 4 3  None   Left PERRL 5 4 Slow None          Extraocular Movement       Right Left    Full Full         Neuro/Psych     Oriented x3: Yes   Mood/Affect: Normal         Dilation     Left eye: 1.0% Mydriacyl, 2.5% Phenylephrine @ 8:25 AM           Slit Lamp and Fundus Exam     External Exam       Right Left   External Normal Normal         Slit Lamp Exam       Right Left   Lids/Lashes Normal Normal   Conjunctiva/Sclera White and quiet White and quiet   Cornea Clear Clear   Anterior Chamber Deep and quiet Deep and quiet   Iris Round and reactive Round and reactive, no NVI   Lens 3+ Nuclear sclerosis Centered posterior chamber intraocular lens   Anterior Vitreous Normal 3+ Cells, RBCs         Fundus Exam       Right Left   Posterior Vitreous  Clear, avitric   Disc  Normal   C/D Ratio  0.7   Macula  Macula attached   Vessels  Very severe NPDR peripherally   Periphery  Good peripheral anterior PRP 360            IMAGING AND PROCEDURES  Imaging and Procedures for 11/05/21  OCT, Retina - OU - Both Eyes       Right Eye Quality was good. Scan locations included subfoveal. Central Foveal Thickness: 227. Progression has been stable. Findings include vitreomacular adhesion , vitreous traction.   Left Eye Quality was good. Scan locations included subfoveal. Central Foveal Thickness: 329. Progression has no prior data. Findings include abnormal foveal contour.   Notes OD with minor inner foveal retinal distortion from focal vitreous traction.  No impact on acuity observe  OS with minor surprising CME perifoveal temporal aspect of the fovea raises the specter of possible MAC-TEL.     Color Fundus Photography Optos - OU - Both Eyes       Right Eye Progression has no prior data. Disc findings include increased cup to disc  ratio. Macula : microaneurysms. Vessels : normal observations. Periphery : normal observations.   Left Eye Progression has no prior data. Disc findings include normal  observations, increased cup to disc ratio. Macula : normal observations. Vessels : normal observations.   Notes  OS vastly improved media, hemorrhage cleared.  Good peripheral PRP.  Excellent acuity OS   OD no real significant retinopathy seen             ASSESSMENT/PLAN:  Snores Occasional nocturia, may have sleep apnea patient encouraged to check at this condition with home sleep study via PCP Dr. Willey Blade  Vitreous hemorrhage of left eye ([place]) 1 week postop vitrectomy for possible PDR related disease, now with good peripheral anterior PRP to possible ischemic areas.       ICD-10-CM   1. Vitreous hemorrhage of left eye (HCC)  H43.12 OCT, Retina - OU - Both Eyes    Color Fundus Photography Optos - OU - Both Eyes    2. Snores  R06.83       1.  OS looks great, status post vitrectomy continue to use medications topically for 2 more weeks.  Patient is not to refill either 1 if the medications are completed before that time  2.  Patient is not to continue medications left eye from surgery after 2 weeks  3.  Incidentally patient has micro CME noted on OCT left eye which suggested underlying possibility of macular telangiectasis.  This could also be diabetic retinopathy.  Patient  Review of systems notable the patient does snore I encouraged the patient therefore did not seek evaluation home sleep study via Dr. Willey Blade simply to confirm she does not have significant sleep apnea as experienced by her daughter  Ophthalmic Meds Ordered this visit:  No orders of the defined types were placed in this encounter.      Return in about 10 weeks (around 01/14/2022) for DILATE OU, OCT, COLOR FP.  Patient Instructions  Ofloxacin  4 times daily to the operative eye  Prednisolone acetate 1 drop to the operative eye 4 times daily  Patient instructed not to refill the medications and use them for maximum of 3 weeks.  Patient instructed do not rub the eye.  Patient  has the option to use the patch at night.    Explained the diagnoses, plan, and follow up with the patient and they expressed understanding.  Patient expressed understanding of the importance of proper follow up care.   Clent Demark Mally Gavina M.D. Diseases & Surgery of the Retina and Vitreous [facility] 11/05/21     Abbreviations: M myopia (nearsighted); A astigmatism; H hyperopia (farsighted); P presbyopia; Mrx spectacle prescription;  CTL contact lenses; OD right eye; OS left eye; OU both eyes  XT exotropia; ET esotropia; PEK punctate epithelial keratitis; PEE punctate epithelial erosions; DES dry eye syndrome; MGD meibomian gland dysfunction; ATs artificial tears; PFAT's preservative free artificial tears; [place] nuclear sclerotic cataract; PSC posterior subcapsular cataract; ERM epi-retinal membrane; PVD posterior vitreous detachment; RD retinal detachment; DM diabetes mellitus; DR diabetic retinopathy; NPDR non-proliferative diabetic retinopathy; PDR proliferative diabetic retinopathy; CSME clinically significant macular edema; DME diabetic macular edema; dbh dot blot hemorrhages; CWS cotton wool spot; POAG primary open angle glaucoma; C/D cup-to-disc ratio; HVF humphrey visual field; GVF goldmann visual field; OCT optical coherence tomography; IOP intraocular pressure; BRVO Branch retinal vein occlusion; CRVO central retinal vein occlusion; CRAO central retinal artery occlusion; BRAO branch retinal artery occlusion; RT retinal tear; SB scleral  buckle; PPV pars plana vitrectomy; VH Vitreous hemorrhage; PRP panretinal laser photocoagulation; IVK intravitreal kenalog; VMT vitreomacular traction; MH Macular hole;  NVD neovascularization of the disc; NVE neovascularization elsewhere; AREDS age related eye disease study; ARMD age related macular degeneration; POAG primary open angle glaucoma; EBMD epithelial/anterior basement membrane dystrophy; ACIOL anterior chamber intraocular lens; IOL  intraocular lens; PCIOL posterior chamber intraocular lens; Phaco/IOL phacoemulsification with intraocular lens placement; [place] photorefractive keratectomy; LASIK laser assisted in situ keratomileusis; HTN hypertension; DM diabetes mellitus; COPD chronic obstructive pulmonary disease

## 2022-01-01 ENCOUNTER — Other Ambulatory Visit: Payer: Self-pay | Admitting: Rheumatology

## 2022-01-14 ENCOUNTER — Encounter (INDEPENDENT_AMBULATORY_CARE_PROVIDER_SITE_OTHER): Payer: Medicare Other | Admitting: Ophthalmology

## 2022-01-26 ENCOUNTER — Encounter (INDEPENDENT_AMBULATORY_CARE_PROVIDER_SITE_OTHER): Payer: Medicare Other | Admitting: Ophthalmology

## 2022-02-02 ENCOUNTER — Other Ambulatory Visit: Payer: Self-pay

## 2022-02-02 ENCOUNTER — Ambulatory Visit (INDEPENDENT_AMBULATORY_CARE_PROVIDER_SITE_OTHER): Payer: Medicare Other | Admitting: Ophthalmology

## 2022-02-02 ENCOUNTER — Encounter (INDEPENDENT_AMBULATORY_CARE_PROVIDER_SITE_OTHER): Payer: Self-pay | Admitting: Ophthalmology

## 2022-02-02 DIAGNOSIS — H2511 Age-related nuclear cataract, right eye: Secondary | ICD-10-CM

## 2022-02-02 DIAGNOSIS — E113592 Type 2 diabetes mellitus with proliferative diabetic retinopathy without macular edema, left eye: Secondary | ICD-10-CM

## 2022-02-02 DIAGNOSIS — H4312 Vitreous hemorrhage, left eye: Secondary | ICD-10-CM | POA: Diagnosis not present

## 2022-02-02 DIAGNOSIS — E113391 Type 2 diabetes mellitus with moderate nonproliferative diabetic retinopathy without macular edema, right eye: Secondary | ICD-10-CM

## 2022-02-02 NOTE — Assessment & Plan Note (Signed)
OS condition cleared postvitrectomy PRP ?

## 2022-02-02 NOTE — Assessment & Plan Note (Signed)
OS stable overall, no active disease, good PRP ?

## 2022-02-02 NOTE — Assessment & Plan Note (Signed)
OD with cataract remaining, moderate ?

## 2022-02-02 NOTE — Assessment & Plan Note (Signed)
OD stable, no active progression ?

## 2022-02-02 NOTE — Progress Notes (Signed)
02/02/2022     CHIEF COMPLAINT Patient presents for  Chief Complaint  Patient presents with   Diabetic Retinopathy without Macular Edema      HISTORY OF PRESENT ILLNESS: [name] [name] is a 76 y.o. female who presents to the clinic today for:   HPI   OS doing well some 3 months after vitrectomy for dense nonclearing vitreous hemorrhage left eye.  Visual acuity has continued to improve  No medical history changes in the interim since visits Last edited by Edmon Crape, MD on 02/02/2022  8:39 AM.      Referring physician: Sallye Lat, MD [address] [place],  [place] [address]  HISTORICAL INFORMATION:   Selected notes from the MEDICAL RECORD NUMBER    Lab Results  Component Value Date   HGBA1C 7.0 (H) 11/16/2018     CURRENT MEDICATIONS: No current outpatient medications on file. (Ophthalmic Drugs)   No current facility-administered medications for this visit. (Ophthalmic Drugs)   Current Outpatient Medications (Other)  Medication Sig   aspirin 81 MG tablet Take 81 mg by mouth daily.   Cholecalciferol (VITAMIN D) 2000 UNITS CAPS Take 2,000 Units by mouth 2 (two) times a week. Reported on 11/18/2015   colchicine 0.6 MG tablet Take a half tablet (0.3 mg total) by mouth daily.   diltiazem (TIAZAC) 300 MG 24 hr capsule TAKE 1 CAPSULE BY MOUTH EVERY DAY (Patient taking differently: Take 300 mg by mouth daily.)   glucose blood (ONETOUCH VERIO) test strip USE TO TEST BLOOD SUGAR 3 TIMES DAILY   SitaGLIPtin-MetFORMIN HCl (JANUMET XR) 50-500 MG TB24 Take 2 tablets by mouth daily.   trolamine salicylate (ASPERCREME) 10 % cream Apply 1 application topically as needed for muscle pain.   No current facility-administered medications for this visit. (Other)      REVIEW OF SYSTEMS: ROS   Negative for: Constitutional, Gastrointestinal, Neurological, Skin, Genitourinary, Musculoskeletal, HENT, Endocrine, Cardiovascular, Eyes, Respiratory, Psychiatric,  Allergic/Imm, Heme/Lymph Last edited by Edmon Crape, MD on 02/02/2022  8:34 AM.       ALLERGIES Allergies  Allergen Reactions   Colchicine Nausea Only   Hydrocodone Other (See Comments)   Sulindac Other (See Comments)   Tramadol Other (See Comments)    "Made me feel like I was in another world."   Celecoxib Rash   Glimepiride Rash    PAST MEDICAL HISTORY Past Medical History:  Diagnosis Date   Bilateral knee pain    Euflexxa x3   Chest pain 06/2007   CP and QW on EKG: neg stress test    Diabetes mellitus    w/ neuropathy   Diverticulitis 2000   Gout    Hyperlipidemia    Hypertension    Insomnia 02/20/2013   Osteoarthritis    gout, CPPD--- sees Dr.Davenshwar   Osteoarthritis of both feet 09/14/2016   Osteoarthritis of both hands 09/14/2016   Osteoarthritis of both knees 09/14/2016   Osteopenia    per DEXA 4/10   Recurrent boils    h/o MRSA   Type 2 diabetes mellitus without complication (HCC) 12/17/2020   Past Surgical History:  Procedure Laterality Date   CHOLECYSTECTOMY     TONSILLECTOMY      FAMILY HISTORY Family History  Problem Relation Age of Onset   Hypertension Mother    Diabetes Mother    Hypertension Father    Diabetes Father    Hypertension Daughter    Hypertension Sister    Diabetes Sister  Coronary artery disease Neg Hx    Stroke Neg Hx    Colon cancer Neg Hx    Breast cancer Neg Hx     SOCIAL HISTORY Social History   Tobacco Use   Smoking status: Never   Smokeless tobacco: Never  Vaping Use   Vaping Use: Never used  Substance Use Topics   Alcohol use: No   Drug use: No         OPHTHALMIC EXAM:  Base Eye Exam     Visual Acuity (ETDRS)       Right Left   Dist [place] 20/30 +2 20/25         Tonometry (Tonopen, 8:37 AM)       Right Left   Pressure 11 12         Pupils       Pupils APD   Right PERRL None   Left PERRL None         Visual Fields       Left Right    Full Full         Extraocular  Movement       Right Left    Full, Ortho Full, Ortho         Neuro/Psych     Oriented x3: Yes   Mood/Affect: Normal         Dilation     Both eyes: 1.0% Mydriacyl, 2.5% Phenylephrine @ 8:37 AM           Slit Lamp and Fundus Exam     External Exam       Right Left   External Normal Normal         Slit Lamp Exam       Right Left   Lids/Lashes Normal Normal   Conjunctiva/Sclera White and quiet White and quiet   Cornea Clear Clear   Anterior Chamber Deep and quiet Deep and quiet   Iris Round and reactive Round and reactive, no NVI   Lens 3+ Nuclear sclerosis Centered posterior chamber intraocular lens   Anterior Vitreous Normal Clear and avitric         Fundus Exam       Right Left   Posterior Vitreous Posterior vitreous detachment Clear, avitric   Disc  Normal   C/D Ratio 0.7 0.7   Macula  Macula attached   Vessels  Very severe NPDR peripherally   Periphery  Good peripheral anterior PRP 360            IMAGING AND PROCEDURES  Imaging and Procedures for 02/02/22  Color Fundus Photography Optos - OU - Both Eyes       Right Eye Progression has no prior data. Disc findings include increased cup to disc ratio. Macula : microaneurysms. Vessels : normal observations. Periphery : normal observations.   Left Eye Progression has no prior data. Disc findings include normal observations, increased cup to disc ratio. Macula : normal observations. Vessels : normal observations.   Notes  OS vastly improved media, hemorrhage cleared.  Good peripheral PRP.  Excellent acuity OS   OD no real significant retinopathy seen on color fundus photography             ASSESSMENT/PLAN:  Proliferative diabetic retinopathy, left eye (HCC) OS stable overall, no active disease, good PRP  Vitreous hemorrhage of left eye (HCC) OS condition cleared postvitrectomy PRP  Age-related nuclear cataract, right OD with cataract remaining, moderate  Moderate  nonproliferative diabetic retinopathy of right eye (  HCC) OD stable, no active progression     ICD-10-CM   1. Proliferative diabetic retinopathy of left eye without macular edema associated with type 2 diabetes mellitus (HCC)  W09.8119 Color Fundus Photography Optos - OU - Both Eyes    2. Vitreous hemorrhage of left eye (HCC)  H43.12 Color Fundus Photography Optos - OU - Both Eyes    3. Age-related nuclear cataract, right  H25.11     4. Moderate nonproliferative diabetic retinopathy of right eye without macular edema associated with type 2 diabetes mellitus (HCC)  J47.8295 Color Fundus Photography Optos - OU - Both Eyes      1.  OS vastly improved vision from hand motion now to 20/25+.  With quiescent PDR OS.    2.  OD only moderate NPDR noted.  We will continue to monitor closely  3.  Moderate NSC changes OD follow-up Dr. Dione Booze as scheduled  Ophthalmic Meds Ordered this visit:  No orders of the defined types were placed in this encounter.      Return in about 6 months (around 08/05/2022) for DILATE OU, COLOR FP, OCT.  There are no Patient Instructions on file for this visit.   Explained the diagnoses, plan, and follow up with the patient and they expressed understanding.  Patient expressed understanding of the importance of proper follow up care.   Alford Highland Marnita Poirier M.D. Diseases & Surgery of the Retina and Vitreous [facility] 02/02/22     Abbreviations: M myopia (nearsighted); A astigmatism; H hyperopia (farsighted); P presbyopia; Mrx spectacle prescription;  CTL contact lenses; OD right eye; OS left eye; OU both eyes  XT exotropia; ET esotropia; PEK punctate epithelial keratitis; PEE punctate epithelial erosions; DES dry eye syndrome; MGD meibomian gland dysfunction; ATs artificial tears; PFAT's preservative free artificial tears; NSC nuclear sclerotic cataract; PSC posterior subcapsular cataract; ERM epi-retinal membrane; PVD posterior vitreous detachment;  RD retinal detachment; DM diabetes mellitus; DR diabetic retinopathy; NPDR non-proliferative diabetic retinopathy; PDR proliferative diabetic retinopathy; CSME clinically significant macular edema; DME diabetic macular edema; dbh dot blot hemorrhages; CWS cotton wool spot; POAG primary open angle glaucoma; C/D cup-to-disc ratio; HVF humphrey visual field; GVF goldmann visual field; OCT optical coherence tomography; IOP intraocular pressure; BRVO Branch retinal vein occlusion; CRVO central retinal vein occlusion; CRAO central retinal artery occlusion; BRAO branch retinal artery occlusion; RT retinal tear; SB scleral buckle; PPV pars plana vitrectomy; VH Vitreous hemorrhage; PRP panretinal laser photocoagulation; IVK intravitreal kenalog; VMT vitreomacular traction; MH Macular hole;  NVD neovascularization of the disc; NVE neovascularization elsewhere; AREDS age related eye disease study; ARMD age related macular degeneration; POAG primary open angle glaucoma; EBMD epithelial/anterior basement membrane dystrophy; ACIOL anterior chamber intraocular lens; IOL intraocular lens; PCIOL posterior chamber intraocular lens; Phaco/IOL phacoemulsification with intraocular lens placement; PRK photorefractive keratectomy; LASIK laser assisted in situ keratomileusis; HTN hypertension; DM diabetes mellitus; COPD chronic obstructive pulmonary disease

## 2022-02-04 ENCOUNTER — Other Ambulatory Visit: Payer: Self-pay | Admitting: Rheumatology

## 2022-02-22 NOTE — Progress Notes (Signed)
? ?Office Visit Note ? ?Patient: [name] [name]             ?Date of Birth: [birth year]           ?MRN: [id number]             ?PCP: Andi Devon, MD ?Referring: Andi Devon, MD ?Visit Date: 02/23/2022 ?Occupation: @GUAROCC @ ? ?Subjective:  ?Pain in both shoulders and lower back ? ?History of Present Illness: [name] [name] is a 76 y.o. female with history of osteoarthritis and pseudogout.  She states she has been having pain and discomfort in her both shoulders.  She is having difficulty raising her left arm.  She has been experiencing discomfort in her neck and lower back as well.  She is currently not having much discomfort in her hands and knee joints.  The trochanteric bursitis has improved.  She has been experiencing some lower back pain for the last couple of days.  She denies any radiculopathy. ? ?Activities of Daily Living:  ?Patient reports morning stiffness for all day. ?Patient Denies nocturnal pain.  ?Difficulty dressing/grooming: Reports ?Difficulty climbing stairs: Denies ?Difficulty getting out of chair: Denies ?Difficulty using hands for taps, buttons, cutlery, and/or writing: Denies ? ?Review of Systems  ?Constitutional:  Negative for fatigue.  ?HENT:  Negative for mouth sores, mouth dryness and nose dryness.   ?Eyes:  Negative for pain, itching and dryness.  ?Respiratory:  Negative for shortness of breath and difficulty breathing.   ?Cardiovascular:  Negative for chest pain and palpitations.  ?Gastrointestinal:  Negative for blood in stool, constipation and diarrhea.  ?Endocrine: Negative for increased urination.  ?Genitourinary:  Negative for difficulty urinating.  ?Musculoskeletal:  Positive for joint pain, joint pain, joint swelling, myalgias, morning stiffness, muscle tenderness and myalgias.  ?Skin:  Negative for color change, rash and redness.  ?Allergic/Immunologic: Negative for susceptible to infections.  ?Neurological:  Negative for dizziness, numbness, headaches, memory  loss and weakness.  ?Hematological:  Negative for bruising/bleeding tendency.  ?Psychiatric/Behavioral:  Negative for confusion.   ? ?PMFS History:  ?Patient Active Problem List  ? Diagnosis Date Noted  ? 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) 02/02/2022  ? Snores 11/05/2021  ? Pseudophakia of left eye 10/14/2021  ? Vitreous hemorrhage of left eye (HCC) 09/15/2021  ? 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 Proliferative diabetic retinopathy, left eye (HCC) 09/15/2021  ? Age-related nuclear cataract, right 09/15/2021  ? Overweight 12/17/2020  ? Vitamin D deficiency 12/17/2020  ? 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 History of hypertension 12/08/2016  ? History of diabetes mellitus 12/08/2016  ? Dyslipidemia 12/08/2016  ? Osteoarthritis of both hands 09/14/2016  ? Osteoarthritis of both feet 09/14/2016  ? Osteoarthritis of both knees 09/14/2016  ? PCP NOTES >>> 08/19/2015  ? Low back pain 09/09/2014  ? Neuralgia of left saphenous nerve 06/05/2014  ? Numbness and tingling in left arm 06/05/2014  ? Paresthesia of left arm 05/21/2014  ? Gastroparesis 02/20/2013  ? Insomnia 02/20/2013  ? Annual physical exam 03/31/2012  ? History of diverticulitis 04/14/2010  ? Osteopenia 05/14/2009  ? DYSPHAGIA UNSPECIFIED 01/10/2009  ? 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 Hyperlipidemia 06/14/2007  ? Essential hypertension 06/14/2007  ? Osteoarthritis 06/14/2007  ? DM (diabetes mellitus) (HCC) 01/02/2007  ?  ?Past Medical History:  ?Diagnosis Date  ? Bilateral knee pain   ? Euflexxa x3  ? Chest pain 06/2007  ? CP and QW on EKG: neg stress test   ? Diabetes  mellitus   ? w/ neuropathy  ? Diverticulitis 2000  ? Gout   ? Hyperlipidemia   ? Hypertension   ? Insomnia 02/20/2013  ? Osteoarthritis   ? gout, CPPD--- sees Dr.Davenshwar  ? Osteoarthritis of both feet 09/14/2016  ? Osteoarthritis of both hands 09/14/2016  ? Osteoarthritis of both knees 09/14/2016  ? Osteopenia   ? per DEXA 4/10  ? Recurrent boils   ? h/o  MRSA  ? Type 2 diabetes mellitus without complication (HCC) 12/17/2020  ?  ?Family History  ?Problem Relation Age of Onset  ? Hypertension Mother   ? Diabetes Mother   ? Hypertension Father   ? Diabetes Father   ? Hypertension Daughter   ? Hypertension Sister   ? Diabetes Sister   ? Coronary artery disease Neg Hx   ? Stroke Neg Hx   ? Colon cancer Neg Hx   ? Breast cancer Neg Hx   ? ?Past Surgical History:  ?Procedure Laterality Date  ? CHOLECYSTECTOMY    ? TONSILLECTOMY    ? ?Social History  ? ?Social History Narrative  ? Lives w/ husband  ? Patient is married with 3 children.  ? Patient is right handed.  ? Patient has college education.  ? Patient drinks 3-4 daily.  ? ?Immunization History  ?Administered Date(s) Administered  ? Influenza Split 08/01/2012  ? Influenza Whole 10/14/2009  ? Influenza, High Dose Seasonal PF 09/12/2013, 08/19/2015, 09/14/2018  ? Influenza,inj,Quad PF,6+ Mos 09/24/2014  ? Influenza-Unspecified 10/01/2019  ? PFIZER(Purple Top)SARS-COV-2 Vaccination 12/11/2019, 01/01/2020  ? Pneumococcal Conjugate-13 01/15/2014  ? Pneumococcal Polysaccharide-23 03/31/2012  ? Td 08/14/2010  ?  ? ?Objective: ?Vital Signs: BP (!) 145/81 (BP Location: Left Arm, Patient Position: Sitting, Cuff Size: Normal)   Pulse 86   Ht 5\' 2"  (1.575 m)   Wt 142 lb 9.6 oz (64.7 kg)   BMI 26.08 kg/m?   ? ?Physical Exam ?Vitals and nursing note reviewed.  ?Constitutional:   ?   Appearance: She is well-developed.  ?HENT:  ?   Head: Normocephalic and atraumatic.  ?Eyes:  ?   Conjunctiva/sclera: Conjunctivae normal.  ?Cardiovascular:  ?   Rate and Rhythm: Normal rate and regular rhythm.  ?   Heart sounds: Normal heart sounds.  ?Pulmonary:  ?   Effort: Pulmonary effort is normal.  ?   Breath sounds: Normal breath sounds.  ?Abdominal:  ?   General: Bowel sounds are normal.  ?   Palpations: Abdomen is soft.  ?Musculoskeletal:  ?   Cervical back: Normal range of motion.  ?Lymphadenopathy:  ?   Cervical: No cervical adenopathy.   ?Skin: ?   General: Skin is warm and dry.  ?   Capillary Refill: Capillary refill takes less than 2 seconds.  ?Neurological:  ?   Mental Status: She is alert and oriented to person, place, and time.  ?Psychiatric:     ?   Behavior: Behavior normal.  ?  ? ?Musculoskeletal Exam: C-spine was in good range of motion.  Thoracic and lumbar spine were difficult to assess.  She had no point tenderness.  She had good range of motion of her right shoulder joint.  She had discomfort with range of motion of her left shoulder joint with abduction limited to 120 degrees.  She had limited internal rotation.  Elbows with good range of motion.  She had bilateral PIP and DIP thickening with no synovitis.  Hip joints and knee joints in good range of motion.  There was no  tenderness over ankles or MTPs. ? ?CDAI Exam: ?CDAI Score: -- ?Patient Global: --; Provider Global: -- ?Swollen: --; Tender: -- ?Joint Exam 02/23/2022  ? ?No joint exam has been documented for this visit  ? ?There is currently no information documented on the homunculus. Go to the Rheumatology activity and complete the homunculus joint exam. ? ?Investigation: ?No additional findings. ? ?Imaging: ?Color Fundus Photography Optos - OU - Both Eyes ? ?Result Date: 02/02/2022 ?Right Eye Progression has no prior data. Disc findings include increased cup to disc ratio. Macula : microaneurysms. Vessels : normal observations. Periphery : normal observations. Left Eye Progression has no prior data. Disc findings include normal observations, increased cup to disc ratio. Macula : normal observations. Vessels : normal observations. Notes OS vastly improved media, hemorrhage cleared.  Good peripheral PRP.  Excellent acuity OS OD no real significant retinopathy seen on color fundus photography  ? ?Recent Labs: ?Lab Results  ?Component Value Date  ? WBC 9.9 08/27/2020  ? HGB 11.3 (L) 08/27/2020  ? PLT 372 08/27/2020  ? NA 141 10/14/2020  ? K 3.1 (L) 10/14/2020  ? CL 96 (L) 10/14/2020   ? CO2 35 (H) 10/14/2020  ? GLUCOSE 289 (H) 10/14/2020  ? BUN 5 (L) 10/14/2020  ? CREATININE 0.71 10/14/2020  ? BILITOT 0.3 10/14/2020  ? ALKPHOS 122 (H) 11/16/2018  ? AST 9 (L) 10/14/2020  ? ALT 6 10/14/2020  ? PRO

## 2022-02-23 ENCOUNTER — Ambulatory Visit (INDEPENDENT_AMBULATORY_CARE_PROVIDER_SITE_OTHER): Payer: Medicare Other | Admitting: Rheumatology

## 2022-02-23 ENCOUNTER — Encounter: Payer: Self-pay | Admitting: Rheumatology

## 2022-02-23 VITALS — BP 145/81 | HR 86 | Ht 62.0 in | Wt 142.6 lb

## 2022-02-23 DIAGNOSIS — M19071 Primary osteoarthritis, right ankle and foot: Secondary | ICD-10-CM | POA: Diagnosis not present

## 2022-02-23 DIAGNOSIS — M170 Bilateral primary osteoarthritis of knee: Secondary | ICD-10-CM

## 2022-02-23 DIAGNOSIS — M7062 Trochanteric bursitis, left hip: Secondary | ICD-10-CM

## 2022-02-23 DIAGNOSIS — M25512 Pain in left shoulder: Secondary | ICD-10-CM | POA: Diagnosis not present

## 2022-02-23 DIAGNOSIS — Z87898 Personal history of other specified conditions: Secondary | ICD-10-CM

## 2022-02-23 DIAGNOSIS — M19041 Primary osteoarthritis, right hand: Secondary | ICD-10-CM | POA: Diagnosis not present

## 2022-02-23 DIAGNOSIS — M8589 Other specified disorders of bone density and structure, multiple sites: Secondary | ICD-10-CM

## 2022-02-23 DIAGNOSIS — Z8739 Personal history of other diseases of the musculoskeletal system and connective tissue: Secondary | ICD-10-CM

## 2022-02-23 DIAGNOSIS — Z8679 Personal history of other diseases of the circulatory system: Secondary | ICD-10-CM

## 2022-02-23 DIAGNOSIS — M19042 Primary osteoarthritis, left hand: Secondary | ICD-10-CM

## 2022-02-23 DIAGNOSIS — G8929 Other chronic pain: Secondary | ICD-10-CM

## 2022-02-23 DIAGNOSIS — M545 Low back pain, unspecified: Secondary | ICD-10-CM

## 2022-02-23 DIAGNOSIS — Z8639 Personal history of other endocrine, nutritional and metabolic disease: Secondary | ICD-10-CM

## 2022-02-23 DIAGNOSIS — M19072 Primary osteoarthritis, left ankle and foot: Secondary | ICD-10-CM

## 2022-02-23 DIAGNOSIS — Z8719 Personal history of other diseases of the digestive system: Secondary | ICD-10-CM

## 2022-02-23 DIAGNOSIS — M7061 Trochanteric bursitis, right hip: Secondary | ICD-10-CM

## 2022-02-23 MED ORDER — COLCHICINE 0.6 MG PO TABS: ORAL_TABLET | ORAL | 1 refills | Status: DC

## 2022-02-23 NOTE — Patient Instructions (Signed)
Back Exercises ?The following exercises strengthen the muscles that help to support the trunk (torso) and back. They also help to keep the lower back flexible. Doing these exercises can help to prevent or lessen existing low back pain. ?If you have back pain or discomfort, try doing these exercises 2-3 times each day or as told by your health care provider. ?As your pain improves, do them once each day, but increase the number of times that you repeat the steps for each exercise (do more repetitions). ?To prevent the recurrence of back pain, continue to do these exercises once each day or as told by your health care provider. ?Do exercises exactly as told by your health care provider and adjust them as directed. It is normal to feel mild stretching, pulling, tightness, or discomfort as you do these exercises, but you should stop right away if you feel sudden pain or your pain gets worse. ?Exercises ?Single knee to chest ?Repeat these steps 3-5 times for each leg: ?Lie on your back on a firm bed or the floor with your legs extended. ?Bring one knee to your chest. Your other leg should stay extended and in contact with the floor. ?Hold your knee in place by grabbing your knee or thigh with both hands and hold. ?Pull on your knee until you feel a gentle stretch in your lower back or buttocks. ?Hold the stretch for 10-30 seconds. ?Slowly release and straighten your leg. ? ?Pelvic tilt ?Repeat these steps 5-10 times: ?Lie on your back on a firm bed or the floor with your legs extended. ?Bend your knees so they are pointing toward the ceiling and your feet are flat on the floor. ?Tighten your lower abdominal muscles to press your lower back against the floor. This motion will tilt your pelvis so your tailbone points up toward the ceiling instead of pointing to your feet or the floor. ?With gentle tension and even breathing, hold this position for 5-10 seconds. ? ?Cat-cow ?Repeat these steps until your lower back becomes  more flexible: ?Get into a hands-and-knees position on a firm bed or the floor. Keep your hands under your shoulders, and keep your knees under your hips. You may place padding under your knees for comfort. ?Let your head hang down toward your chest. Contract your abdominal muscles and point your tailbone toward the floor so your lower back becomes rounded like the back of a cat. ?Hold this position for 5 seconds. ?Slowly lift your head, let your abdominal muscles relax, and point your tailbone up toward the ceiling so your back forms a sagging arch like the back of a cow. ?Hold this position for 5 seconds. ? ?Press-ups ?Repeat these steps 5-10 times: ?Lie on your abdomen (face-down) on a firm bed or the floor. ?Place your palms near your head, about shoulder-width apart. ?Keeping your back as relaxed as possible and keeping your hips on the floor, slowly straighten your arms to raise the top half of your body and lift your shoulders. Do not use your back muscles to raise your upper torso. You may adjust the placement of your hands to make yourself more comfortable. ?Hold this position for 5 seconds while you keep your back relaxed. ?Slowly return to lying flat on the floor. ? ?Bridges ?Repeat these steps 10 times: ?Lie on your back on a firm bed or the floor. ?Bend your knees so they are pointing toward the ceiling and your feet are flat on the floor. Your arms should be flat  at your sides, next to your body. ?Tighten your buttocks muscles and lift your buttocks off the floor until your waist is at almost the same height as your knees. You should feel the muscles working in your buttocks and the back of your thighs. If you do not feel these muscles, slide your feet 1-2 inches (2.5-5 cm) farther away from your buttocks. ?Hold this position for 3-5 seconds. ?Slowly lower your hips to the starting position, and allow your buttocks muscles to relax completely. ?If this exercise is too easy, try doing it with your arms  crossed over your chest. ?Abdominal crunches ?Repeat these steps 5-10 times: ?Lie on your back on a firm bed or the floor with your legs extended. ?Bend your knees so they are pointing toward the ceiling and your feet are flat on the floor. ?Cross your arms over your chest. ?Tip your chin slightly toward your chest without bending your neck. ?Tighten your abdominal muscles and slowly raise your torso high enough to lift your shoulder blades a tiny bit off the floor. Avoid raising your torso higher than that because it can put too much stress on your lower back and does not help to strengthen your abdominal muscles. ?Slowly return to your starting position. ? ?Back lifts ?Repeat these steps 5-10 times: ?Lie on your abdomen (face-down) with your arms at your sides, and rest your forehead on the floor. ?Tighten the muscles in your legs and your buttocks. ?Slowly lift your chest off the floor while you keep your hips pressed to the floor. Keep the back of your head in line with the curve in your back. Your eyes should be looking at the floor. ?Hold this position for 3-5 seconds. ?Slowly return to your starting position. ? ?Contact a health care provider if: ?Your back pain or discomfort gets much worse when you do an exercise. ?Your worsening back pain or discomfort does not lessen within 2 hours after you exercise. ?If you have any of these problems, stop doing these exercises right away. Do not do them again unless your health care provider says that you can. ?Get help right away if: ?You develop sudden, severe back pain. If this happens, stop doing the exercises right away. Do not do them again unless your health care provider says that you can. ?This information is not intended to replace advice given to you by your health care provider. Make sure you discuss any questions you have with your health care provider. ?Document Revised: 04/28/2021 Document Reviewed: 01/14/2021 ?Elsevier Patient Education ? 2022 [employer]  [employer] ? ?Shoulder Exercises ?Ask your health care provider which exercises are safe for you. Do exercises exactly as told by your health care provider and adjust them as directed. It is normal to feel mild stretching, pulling, tightness, or discomfort as you do these exercises. Stop right away if you feel sudden pain or your pain gets worse. Do not begin these exercises until told by your health care provider. ?Stretching exercises ?External rotation and abduction ?This exercise is sometimes called corner stretch. This exercise rotates your arm outward (external rotation) and moves your arm out from your body (abduction). ?Stand in a doorway with one of your feet slightly in front of the other. This is called a staggered stance. If you cannot reach your forearms to the door frame, stand facing a corner of a room. ?Choose one of the following positions as told by your health care provider: ?Place your hands and forearms on the door frame above  your head. ?Place your hands and forearms on the door frame at the height of your head. ?Place your hands on the door frame at the height of your elbows. ?Slowly move your weight onto your front foot until you feel a stretch across your chest and in the front of your shoulders. Keep your head and chest upright and keep your abdominal muscles tight. ?Hold for __________ seconds. ?To release the stretch, shift your weight to your back foot. ?Repeat __________ times. Complete this exercise __________ times a day. ?Extension, standing ?Stand and hold a broomstick, a cane, or a similar object behind your back. ?Your hands should be a little wider than shoulder width apart. ?Your palms should face away from your back. ?Keeping your elbows straight and your shoulder muscles relaxed, move the stick away from your body until you feel a stretch in your shoulders (extension). ?Avoid shrugging your shoulders while you move the stick. Keep your shoulder blades tucked down toward the middle  of your back. ?Hold for __________ seconds. ?Slowly return to the starting position. ?Repeat __________ times. Complete this exercise __________ times a day. ?Range-of-motion exercises ?Pendulum ? ?Weyman Croon

## 2022-03-17 ENCOUNTER — Encounter (INDEPENDENT_AMBULATORY_CARE_PROVIDER_SITE_OTHER): Payer: Self-pay

## 2022-03-17 ENCOUNTER — Other Ambulatory Visit: Payer: Self-pay | Admitting: Rheumatology

## 2022-04-22 ENCOUNTER — Encounter (INDEPENDENT_AMBULATORY_CARE_PROVIDER_SITE_OTHER): Payer: Self-pay | Admitting: Ophthalmology

## 2022-04-22 ENCOUNTER — Ambulatory Visit (INDEPENDENT_AMBULATORY_CARE_PROVIDER_SITE_OTHER): Payer: Medicare Other | Admitting: Ophthalmology

## 2022-04-22 DIAGNOSIS — E113592 Type 2 diabetes mellitus with proliferative diabetic retinopathy without macular edema, left eye: Secondary | ICD-10-CM

## 2022-04-22 DIAGNOSIS — E113391 Type 2 diabetes mellitus with moderate nonproliferative diabetic retinopathy without macular edema, right eye: Secondary | ICD-10-CM | POA: Diagnosis not present

## 2022-04-22 DIAGNOSIS — H2511 Age-related nuclear cataract, right eye: Secondary | ICD-10-CM

## 2022-04-22 NOTE — Assessment & Plan Note (Signed)
Early PDR.  Controlled post vitrectomy PRP left eye with good peripheral PRP stable

## 2022-04-22 NOTE — Assessment & Plan Note (Signed)
Moderate to advanced nuclear sclerotic cataract right eye.  Certainly visually significant in certain lighting conditions if not all the time.  Also causing fluctuation in vision when her blood sugars swinging.  I discussed with patient that cataract surgery would stop these fluctuations in vision if the blood sugar swings.  Follow-up Dr. Dione Booze at any time

## 2022-04-22 NOTE — Progress Notes (Signed)
04/22/2022     CHIEF COMPLAINT Patient presents for  Chief Complaint  Patient presents with   Diabetic Retinopathy without Macular Edema      HISTORY OF PRESENT ILLNESS: [name] [name] is a 76 y.o. female who presents to the clinic today for:   HPI   WIP- Decreased vision/redness OD- advised by [facility] to FU. Last visit was 11 weeks ago. Pt stated, "we went to Dr. Katy Fitch office and he informed us that there was a little blood in the left eye. Before we came to see Dr. Katy Fitch, the right eye was red but main concern was the left eye."  Pt states vision has been stable since last visit. Pt denies floaters and FOL. Pt had cataract surgery performed this year. Pt is unsure when the surgery was performed.    Last edited by Silvestre Moment on 04/22/2022  4:12 PM.      Referring physician: Warden Fillers, MD [address] [place],   [phone]  HISTORICAL INFORMATION:   Selected notes from the MEDICAL RECORD NUMBER    Lab Results  Component Value Date   HGBA1C 7.0 (H) 11/16/2018     CURRENT MEDICATIONS: No current outpatient medications on file. (Ophthalmic Drugs)   No current facility-administered medications for this visit. (Ophthalmic Drugs)   Current Outpatient Medications (Other)  Medication Sig   aspirin 81 MG tablet Take 81 mg by mouth daily.   Cholecalciferol (VITAMIN D) 2000 UNITS CAPS Take 2,000 Units by mouth 2 (two) times a week. Reported on 11/18/2015   colchicine 0.6 MG tablet Take a half tablet (0.3 mg total) by mouth daily.   diltiazem (TIAZAC) 300 MG 24 hr capsule TAKE 1 CAPSULE BY MOUTH EVERY DAY (Patient taking differently: Take 300 mg by mouth daily.)   glucose blood (ONETOUCH VERIO) test strip USE TO TEST BLOOD SUGAR 3 TIMES DAILY   SitaGLIPtin-MetFORMIN HCl (JANUMET XR) 50-500 MG TB24 Take 2 tablets by mouth daily.   trolamine salicylate (ASPERCREME) 10 % cream Apply 1 application topically as needed for muscle pain.   No current  facility-administered medications for this visit. (Other)      REVIEW OF SYSTEMS: ROS   Negative for: Constitutional, Gastrointestinal, Neurological, Skin, Genitourinary, Musculoskeletal, HENT, Endocrine, Cardiovascular, Eyes, Respiratory, Psychiatric, Allergic/Imm, Heme/Lymph Last edited by Silvestre Moment on 04/22/2022  4:11 PM.       ALLERGIES Allergies  Allergen Reactions   Colchicine Nausea Only   Hydrocodone Other (See Comments)   Sulindac Other (See Comments)   Tramadol Other (See Comments)    "Made me feel like I was in another world."   Celecoxib Rash   Glimepiride Rash    PAST MEDICAL HISTORY Past Medical History:  Diagnosis Date   Bilateral knee pain    Euflexxa x3   Chest pain 06/2007   CP and QW on EKG: neg stress test    Diabetes mellitus    w/ neuropathy   Diverticulitis 2000   Gout    Hyperlipidemia    Hypertension    Insomnia 02/20/2013   Osteoarthritis    gout, CPPD--- sees Dr.Davenshwar   Osteoarthritis of both feet 09/14/2016   Osteoarthritis of both hands 09/14/2016   Osteoarthritis of both knees 09/14/2016   Osteopenia    per DEXA 4/10   Recurrent boils    h/o MRSA   Type 2 diabetes mellitus without complication ([place]) 02/20/5461   Past Surgical History:  Procedure Laterality Date   CHOLECYSTECTOMY  TONSILLECTOMY      FAMILY HISTORY Family History  Problem Relation Age of Onset   Hypertension Mother    Diabetes Mother    Hypertension Father    Diabetes Father    Hypertension Daughter    Hypertension Sister    Diabetes Sister    Coronary artery disease Neg Hx    Stroke Neg Hx    Colon cancer Neg Hx    Breast cancer Neg Hx     SOCIAL HISTORY Social History   Tobacco Use   Smoking status: Never    Passive exposure: Never   Smokeless tobacco: Never  Vaping Use   Vaping Use: Never used  Substance Use Topics   Alcohol use: No   Drug use: No         OPHTHALMIC EXAM:  Base Eye Exam     Visual Acuity (ETDRS)       Right  Left   Dist cc 20/25 -2 +2 20/50   Dist ph cc  20/30 -1    Correction: Glasses         Tonometry (Tonopen, 4:19 PM)       Right Left   Pressure 16 16         Pupils       Pupils APD   Right PERRL None   Left PERRL None         Visual Fields       Left Right    Full Full         Neuro/Psych     Oriented x3: Yes   Mood/Affect: Normal         Dilation     Both eyes: 1.0% Mydriacyl, 2.5% Phenylephrine @ 4:19 PM           Slit Lamp and Fundus Exam     External Exam       Right Left   External Normal Normal         Slit Lamp Exam       Right Left   Lids/Lashes Normal Normal   Conjunctiva/Sclera White and quiet White and quiet   Cornea Clear Clear   Anterior Chamber Deep and quiet Deep and quiet   Iris Round and reactive Round and reactive, no NVI   Lens 3+ Nuclear sclerosis Centered posterior chamber intraocular lens   Anterior Vitreous Normal Clear and avitric         Fundus Exam       Right Left   Posterior Vitreous Posterior vitreous detachment Clear, avitric   Disc Normal Normal   C/D Ratio 0.7 0.7   Macula Microaneurysms Macula attached   Vessels NPDR-Severe Very severe NPDR peripherally controlled now with peripheral PRP   Periphery No significant peripheral retinal nonperfusion Good peripheral anterior PRP 360            IMAGING AND PROCEDURES  Imaging and Procedures for 04/22/22  OCT, Retina - OU - Both Eyes       Right Eye Quality was good. Scan locations included subfoveal. Central Foveal Thickness: 226. Progression has been stable. Findings include vitreous traction, vitreomacular adhesion .   Left Eye Quality was good. Scan locations included subfoveal. Central Foveal Thickness: 271. Progression has improved. Findings include abnormal foveal contour.   Notes OD with minor inner foveal retinal distortion from focal vitreous traction.  No impact on acuity observe  OS with minor surprising CME perifoveal  temporal aspect of the fovea raises the specter of possible MAC-TEL.  No active maculopathy at this point left eye     Color Fundus Photography Optos - OU - Both Eyes       Right Eye Progression has no prior data. Disc findings include increased cup to disc ratio. Macula : microaneurysms. Vessels : normal observations. Periphery : normal observations.   Left Eye Progression has no prior data. Disc findings include normal observations, increased cup to disc ratio. Macula : normal observations. Vessels : normal observations.   Notes  OS vastly improved media, hemorrhage cleared.  Good peripheral PRP.  Excellent acuity OS   OD no real significant retinopathy seen on color fundus photography             ASSESSMENT/PLAN:  Age-related nuclear cataract, right Moderate to advanced nuclear sclerotic cataract right eye.  Certainly visually significant in certain lighting conditions if not all the time.  Also causing fluctuation in vision when her blood sugars swinging.  I discussed with patient that cataract surgery would stop these fluctuations in vision if the blood sugar swings.  Follow-up Dr. Katy Fitch at any time  Moderate nonproliferative diabetic retinopathy of right eye ([place]) Minor OD observe  Proliferative diabetic retinopathy, left eye ([place]) Early PDR.  Controlled post vitrectomy PRP left eye with good peripheral PRP stable     ICD-10-CM   1. 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 Age-related nuclear cataract, right  H25.11     3. Moderate nonproliferative diabetic retinopathy of right eye without macular edema associated with type 2 diabetes mellitus ([place])  G38.7564       1.  Moderate NPDR in the right eye observe  2.  OS with quiet PDR now.  3.  OD with cataract follow-up Dr. Katy Fitch as  scheduled or as needed  Ophthalmic Meds Ordered this visit:  No orders of the defined types were placed in this encounter.      Return in about 6 months (around 10/22/2022) for DILATE OU, COLOR FP, OCT.  There are no Patient Instructions on file for this visit.   Explained the diagnoses, plan, and follow up with the patient and they expressed understanding.  Patient expressed understanding of the importance of proper follow up care.   Clent Demark Ashlye Oviedo M.D. Diseases & Surgery of the Retina and Vitreous [facility] Diabetic [place] 04/22/22     Abbreviations: M myopia (nearsighted); A astigmatism; H hyperopia (farsighted); P presbyopia; Mrx spectacle prescription;  CTL contact lenses; OD right eye; OS left eye; OU both eyes  XT exotropia; ET esotropia; PEK punctate epithelial keratitis; PEE punctate epithelial erosions; DES dry eye syndrome; MGD meibomian gland dysfunction; ATs artificial tears; PFAT's preservative free artificial tears; [place] nuclear sclerotic cataract; PSC posterior subcapsular cataract; ERM epi-retinal membrane; PVD posterior vitreous detachment; RD retinal detachment; DM diabetes mellitus; DR diabetic retinopathy; NPDR non-proliferative diabetic retinopathy; PDR proliferative diabetic retinopathy; CSME clinically significant macular edema; DME diabetic macular edema; dbh dot blot hemorrhages; CWS cotton wool spot; POAG primary open angle glaucoma; C/D cup-to-disc ratio; HVF humphrey visual field; GVF goldmann visual field; OCT optical coherence tomography; IOP intraocular pressure; BRVO Branch retinal vein occlusion; CRVO central retinal vein occlusion; CRAO central retinal artery occlusion; BRAO branch retinal artery occlusion; RT retinal tear; SB scleral buckle; PPV pars plana vitrectomy; VH Vitreous hemorrhage; PRP panretinal laser photocoagulation;  IVK intravitreal kenalog; VMT vitreomacular traction; MH Macular hole;  NVD neovascularization of the disc; NVE  neovascularization elsewhere; AREDS age related eye disease study; ARMD age related macular degeneration; POAG primary open angle glaucoma; EBMD epithelial/anterior basement membrane dystrophy; ACIOL anterior chamber intraocular lens; IOL intraocular lens; PCIOL posterior chamber intraocular lens; Phaco/IOL phacoemulsification with intraocular lens placement; [place] photorefractive keratectomy; LASIK laser assisted in situ keratomileusis; HTN hypertension; DM diabetes mellitus; COPD chronic obstructive pulmonary disease

## 2022-04-22 NOTE — Assessment & Plan Note (Signed)
Minor OD observe 

## 2022-04-26 ENCOUNTER — Ambulatory Visit (INDEPENDENT_AMBULATORY_CARE_PROVIDER_SITE_OTHER): Payer: Self-pay | Admitting: Podiatry

## 2022-04-26 DIAGNOSIS — Z91199 Patient's noncompliance with other medical treatment and regimen due to unspecified reason: Secondary | ICD-10-CM

## 2022-05-01 NOTE — Progress Notes (Signed)
   Complete physical exam  Patient: [name] [name]   DOB: 09/04/1998   76 y.o. Female  MRN: [id number]  Subjective:    No chief complaint on file.   [name] [name] is a 76 y.o. female who presents today for a complete physical exam. She reports consuming a {diet types:17450} diet. {types:19826} She generally feels {DESC; WELL/FAIRLY WELL/POORLY:18703}. She reports sleeping {DESC; WELL/FAIRLY WELL/POORLY:18703}. She {does/does not:200015} have additional problems to discuss today.    Most recent fall risk assessment:    05/12/2022   10:42 AM  Fall Risk   Falls in the past year? 0  Number falls in past yr: 0  Injury with Fall? 0  Risk for fall due to : No Fall Risks  Follow up Falls evaluation completed     Most recent depression screenings:    05/12/2022   10:42 AM 04/02/2021   10:46 AM  PHQ 2/9 Scores  PHQ - 2 Score 0 0  PHQ- 9 Score 5     {VISON DENTAL STD PSA (Optional):27386}  {History (Optional):23778}  Patient Care Team: Jessup, Joy, NP as PCP - General (Nurse Practitioner)   Outpatient Medications Prior to Visit  Medication Sig   fluticasone (FLONASE) 50 MCG/ACT nasal spray Place 2 sprays into both nostrils in the morning and at bedtime. After 7 days, reduce to once daily.   norgestimate-ethinyl estradiol (SPRINTEC 28) 0.25-35 MG-MCG tablet Take 1 tablet by mouth daily.   Nystatin POWD Apply liberally to affected area 2 times per day   spironolactone (ALDACTONE) 100 MG tablet Take 1 tablet (100 mg total) by mouth daily.   No facility-administered medications prior to visit.    ROS        Objective:     There were no vitals taken for this visit. {Vitals History (Optional):23777}  Physical Exam   No results found for any visits on 06/17/22. {Show previous labs (optional):23779}    Assessment & Plan:    Routine Health Maintenance and Physical Exam  Immunization History  Administered Date(s) Administered   DTaP 11/18/1999, 01/14/2000,  03/24/2000, 12/08/2000, 06/23/2004   Hepatitis A 04/19/2008, 04/25/2009   Hepatitis B 09/05/1999, 10/13/1999, 03/24/2000   HiB (PRP-OMP) 11/18/1999, 01/14/2000, 03/24/2000, 12/08/2000   IPV 11/18/1999, 01/14/2000, 09/12/2000, 06/23/2004   Influenza,inj,Quad PF,6+ Mos 07/26/2014   Influenza-Unspecified 10/25/2012   MMR 09/12/2001, 06/23/2004   Meningococcal Polysaccharide 04/24/2012   Pneumococcal Conjugate-13 12/08/2000   Pneumococcal-Unspecified 03/24/2000, 06/07/2000   Tdap 04/24/2012   Varicella 09/12/2000, 04/19/2008    Health Maintenance  Topic Date Due   HIV Screening  Never done   Hepatitis C Screening  Never done   INFLUENZA VACCINE  06/15/2022   PAP-Cervical Cytology Screening  06/17/2022 (Originally 09/03/2020)   PAP SMEAR-Modifier  06/17/2022 (Originally 09/03/2020)   TETANUS/TDAP  06/17/2022 (Originally 04/24/2022)   HPV VACCINES  Discontinued   COVID-19 Vaccine  Discontinued    Discussed health benefits of physical activity, and encouraged her to engage in regular exercise appropriate for her age and condition.  Problem List Items Addressed This Visit   None Visit Diagnoses     Annual physical exam    -  Primary   Cervical cancer screening       Need for Tdap vaccination          No follow-ups on file.     Joy Jessup, NP   

## 2022-06-02 ENCOUNTER — Ambulatory Visit (INDEPENDENT_AMBULATORY_CARE_PROVIDER_SITE_OTHER): Payer: Medicare Other | Admitting: Podiatry

## 2022-06-02 DIAGNOSIS — B351 Tinea unguium: Secondary | ICD-10-CM | POA: Diagnosis not present

## 2022-06-02 DIAGNOSIS — E119 Type 2 diabetes mellitus without complications: Secondary | ICD-10-CM

## 2022-06-02 DIAGNOSIS — M79675 Pain in left toe(s): Secondary | ICD-10-CM | POA: Diagnosis not present

## 2022-06-02 DIAGNOSIS — M79674 Pain in right toe(s): Secondary | ICD-10-CM

## 2022-06-02 NOTE — Progress Notes (Signed)
SheThis patient returns to my office for at risk foot care.  This patient requires this care by a professional since this patient will be at risk due to having type 2 diabetes.  This patient is unable to cut nails herself since the patient cannot reach her nails.These nails are painful walking and wearing shoes.  This patient presents for at risk foot care today. She presents to the office five months after her last visit and with her daughter.  General Appearance  Alert, conversant and in no acute stress.  Vascular  Dorsalis pedis and posterior tibial  pulses are palpable  bilaterally.  Capillary return is within normal limits  bilaterally. Temperature is within normal limits  bilaterally.  Neurologic  Senn-Weinstein monofilament wire test within normal limits  bilaterally. Muscle power within normal limits bilaterally.  Nails Thick disfigured discolored nails with subungual debris  from hallux to fifth toes bilaterally. No evidence of bacterial infection or drainage bilaterally.  Orthopedic  No limitations of motion  feet .  No crepitus or effusions noted.  No bony pathology or digital deformities noted.  HAV  B/L.  Pes planus  Skin  normotropic skin with no porokeratosis noted bilaterally.  No signs of infections or ulcers noted.     Onychomycosis  Pain in right toes  Pain in left toes  Consent was obtained for treatment procedures.   Mechanical debridement of nails 1-5  bilaterally performed with a nail nipper.  Filed with dremel without incident.  Upon finishing nail treatment the daughter did not like the appearance of her mothers right big toenail.  She asked me to recheck my work.  I thought it was fine.  I told her that my job was to work on her nails so they were pain free and not to look perfect.   Return office visit    3 months                  Told patient to return for periodic foot care and evaluation due to potential at risk complications.    Nicholes Rough D.P.M.

## 2022-06-29 ENCOUNTER — Other Ambulatory Visit: Payer: Self-pay | Admitting: Rheumatology

## 2022-07-07 ENCOUNTER — Telehealth: Payer: Self-pay | Admitting: Rheumatology

## 2022-07-07 NOTE — Telephone Encounter (Signed)
Patients daughter called the office stating Neyah's right knee is very tight and painful. She states Aubrey would like to come in to have the fluid drained from her knee. Due to limited availability on Dr. Fatima Sanger schedule, please advise.

## 2022-07-07 NOTE — Telephone Encounter (Signed)
Attempted to contact Lorrie and left message for her to call the office to schedule an appointment for [facility]. Will offer 1:40 pm or 2:20 pm with Sherron Ales, PA-C for Right knee pain.

## 2022-07-07 NOTE — Telephone Encounter (Signed)
Spoke with Lorrie and scheduled patient to be seen on 07/08/2022 at 2:20 pm for evaluation of right knee.

## 2022-07-07 NOTE — Progress Notes (Unsigned)
Office Visit Note  Patient: [name] [name]             Date of Birth: 06/10/1946           MRN: [id number]             PCP: Andi Devon, MD Referring: Andi Devon, MD Visit Date: 07/08/2022 Occupation: @GUAROCC @  Subjective:  Right knee joint pain   History of Present Illness: [name] [name] is a 76 y.o. female with history of osteoarthritis and CPPD.  She presents today with right knee joint pain and joint swelling which started 1 week ago. She denies any recent falls or injuries.  She denies any mechanical symptoms.  She has had difficulty walking during the severity of pain.  She is currently using a walker to assist with ambulation.  She has tried ibuprofen, ice, and colchicine 0.6 mg daily for the past 1 week. She has not noticed any improvement in her joint pain or joint swelling in the right knee yet.  She denies any other joint pain or joint swelling.    Activities of Daily Living:  Patient reports morning stiffness for 0 minutes.   Patient Denies nocturnal pain.  Difficulty dressing/grooming: Reports Difficulty climbing stairs: Reports Difficulty getting out of chair: Reports Difficulty using hands for taps, buttons, cutlery, and/or writing: Denies  Review of Systems  Constitutional:  Negative for fatigue.  HENT:  Negative for mouth sores and mouth dryness.   Eyes:  Negative for dryness.  Respiratory:  Negative for shortness of breath.   Cardiovascular:  Negative for chest pain and palpitations.  Gastrointestinal:  Negative for blood in stool, constipation and diarrhea.  Endocrine: Negative for increased urination.  Genitourinary:  Negative for involuntary urination.  Musculoskeletal:  Positive for joint pain, gait problem, joint pain, joint swelling, myalgias and myalgias. Negative for muscle weakness and muscle tenderness.  Skin:  Negative for color change, rash, hair loss and sensitivity to sunlight.  Allergic/Immunologic: Negative for susceptible to  infections.  Neurological:  Negative for dizziness and headaches.  Hematological:  Negative for swollen glands.  Psychiatric/Behavioral:  Negative for depressed mood and sleep disturbance. The patient is not nervous/anxious.     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) 02/02/2022   Snores 11/05/2021   Pseudophakia of left eye 10/14/2021   Vitreous hemorrhage of left eye (HCC) 09/15/2021   Vitreomacular adhesion of right eye 09/15/2021   Proliferative diabetic retinopathy, left eye (HCC) 09/15/2021   Age-related nuclear cataract, right 09/15/2021   Overweight 12/17/2020   Vitamin D deficiency 12/17/2020   CPPD knees hands 12/08/2016   History of hypertension 12/08/2016   History of diabetes mellitus 12/08/2016   Dyslipidemia 12/08/2016   Osteoarthritis of both hands 09/14/2016   Osteoarthritis of both feet 09/14/2016   Osteoarthritis of both knees 09/14/2016   PCP NOTES >>> 08/19/2015   Low back pain 09/09/2014   Neuralgia of left saphenous nerve 06/05/2014   Numbness and tingling in left arm 06/05/2014   Paresthesia of left arm 05/21/2014   Gastroparesis 02/20/2013   Insomnia 02/20/2013   Annual physical exam 03/31/2012   History of diverticulitis 04/14/2010   Osteopenia 05/14/2009   DYSPHAGIA UNSPECIFIED 01/10/2009   Pseudogout and DJD 09/12/2008   Hyperlipidemia 06/14/2007   Essential hypertension 06/14/2007   Osteoarthritis 06/14/2007   DM (diabetes mellitus) (HCC) 01/02/2007    Past Medical History:  Diagnosis Date  Bilateral knee pain    Euflexxa x3   Chest pain 06/2007   CP and QW on EKG: neg stress test    Diabetes mellitus    w/ neuropathy   Diverticulitis 2000   Gout    Hyperlipidemia    Hypertension    Insomnia 02/20/2013   Osteoarthritis    gout, CPPD--- sees Dr.Davenshwar   Osteoarthritis of both feet 09/14/2016   Osteoarthritis of both hands 09/14/2016    Osteoarthritis of both knees 09/14/2016   Osteopenia    per DEXA 4/10   Recurrent boils    h/o MRSA   Type 2 diabetes mellitus without complication (HCC) 12/17/2020    Family History  Problem Relation Age of Onset   Hypertension Mother    Diabetes Mother    Hypertension Father    Diabetes Father    Hypertension Daughter    Hypertension Sister    Diabetes Sister    Coronary artery disease Neg Hx    Stroke Neg Hx    Colon cancer Neg Hx    Breast cancer Neg Hx    Past Surgical History:  Procedure Laterality Date   CHOLECYSTECTOMY     TONSILLECTOMY     Social History   Social History Narrative   Lives w/ husband   Patient is married with 3 children.   Patient is right handed.   Patient has college education.   Patient drinks 3-4 daily.   Immunization History  Administered Date(s) Administered   Influenza Split 08/01/2012   Influenza Whole 10/14/2009   Influenza, High Dose Seasonal PF 09/12/2013, 08/19/2015, 09/14/2018   Influenza,inj,Quad PF,6+ Mos 09/24/2014   Influenza-Unspecified 10/01/2019   PFIZER(Purple Top)SARS-COV-2 Vaccination 12/11/2019, 01/01/2020   Pneumococcal Conjugate-13 01/15/2014   Pneumococcal Polysaccharide-23 03/31/2012   Td 08/14/2010     Objective: Vital Signs: BP 105/64 (BP Location: Left Arm, Patient Position: Sitting, Cuff Size: Normal)   Pulse 73   Resp 14   Ht 5\' 2"  (1.575 m)   Wt 140 lb 9.6 oz (63.8 kg)   BMI 25.72 kg/m    Physical Exam Vitals and nursing note reviewed.  Constitutional:      Appearance: She is well-developed.  HENT:     Head: Normocephalic and atraumatic.  Eyes:     Conjunctiva/sclera: Conjunctivae normal.  Cardiovascular:     Rate and Rhythm: Normal rate and regular rhythm.     Heart sounds: Normal heart sounds.  Pulmonary:     Effort: Pulmonary effort is normal.     Breath sounds: Normal breath sounds.  Abdominal:     General: Bowel sounds are normal.     Palpations: Abdomen is soft.  Musculoskeletal:      Cervical back: Normal range of motion.  Lymphadenopathy:     Cervical: No cervical adenopathy.  Skin:    General: Skin is warm and dry.     Capillary Refill: Capillary refill takes less than 2 seconds.  Neurological:     Mental Status: She is alert and oriented to person, place, and time.  Psychiatric:        Behavior: Behavior normal.      Musculoskeletal Exam: C-spine has good ROM.  Shoulder joints have good ROM with no discomfort or tenderness.  Elbow joints and wrist joints have good ROM.  PIP and DIP thickening consistent with osteoarthritis of both hands.  Hip joints have limited ROM.  Painful ROM and limited extension of the right knee. Warmth and swelling of the right knee.  Moderate effusion  of right knee.  Left knee has good ROM with no discomfort, warmth, or effusion.  Ankle joints have good ROM with no tenderness or joint swelling.   CDAI Exam: CDAI Score: -- Patient Global: --; Provider Global: -- Swollen: --; Tender: -- Joint Exam 07/08/2022   No joint exam has been documented for this visit   There is currently no information documented on the homunculus. Go to the Rheumatology activity and complete the homunculus joint exam.  Investigation: No additional findings.  Imaging: No results found.  Recent Labs: Lab Results  Component Value Date   WBC 9.9 08/27/2020   HGB 11.3 (L) 08/27/2020   PLT 372 08/27/2020   NA 141 10/14/2020   K 3.1 (L) 10/14/2020   CL 96 (L) 10/14/2020   CO2 35 (H) 10/14/2020   GLUCOSE 289 (H) 10/14/2020   BUN 5 (L) 10/14/2020   CREATININE 0.71 10/14/2020   BILITOT 0.3 10/14/2020   ALKPHOS 122 (H) 11/16/2018   AST 9 (L) 10/14/2020   ALT 6 10/14/2020   PROT 6.5 10/14/2020   ALBUMIN 4.2 11/16/2018   CALCIUM 9.5 10/14/2020   GFRAA 98 10/14/2020    Speciality Comments: No specialty comments available.  Procedures:  Large Joint Inj: R knee on 07/08/2022 4:36 PM Indications: pain Details: 22 G 1.5 in needle, medial  approach  Arthrogram: No  Medications: 3 mL lidocaine 1 %; 40 mg triamcinolone acetonide 40 MG/ML Aspirate: 0 mL Outcome: tolerated well, no immediate complications Procedure, treatment alternatives, risks and benefits explained, specific risks discussed. Consent was given by the patient. Immediately prior to procedure a time out was called to verify the correct patient, procedure, equipment, support staff and site/side marked as required. Patient was prepped and draped in the usual sterile fashion.     Allergies: Colchicine, Hydrocodone, Sulindac, Tramadol, Celecoxib, and Glimepiride   Assessment / Plan:     Visit Diagnoses: Primary osteoarthritis of both hands - X-rays of the right hand were obtained on 08/26/2020 which were consistent with osteoarthritis and pseudogout.  She has severe PIP and DIP thickening consistent with osteoarthritis of both hands.  No tenderness or inflammation noted on examination today.  Chronic left shoulder pain: Resolved.  Good range of motion with no discomfort on examination.  Primary osteoarthritis of both knees: Left knee joint has good range of motion with no warmth or effusion on exam.  She presents today with a acute pseudogout flare in the right knee.  A right knee joint cortisone injection was performed and the procedure note was completed above.  Chronic pain of right knee: Patient presents today with acute on chronic pain in her right knee joint.  This pseudogout flare started 1 week ago with no identifiable trigger.  She has not had any recent injury or fall.  She is not experiencing any mechanical symptoms.  She is having difficulty ambulating due to severity of pain and swelling.  She has been using a walker to assist with ambulation.  She has tried taking ibuprofen as well as colchicine 0.6 mg 1 tablet daily with no improvement in her symptoms.  She is also tried topical agents as well as ice for symptomatic relief.  Different treatment options were  discussed today.  The patient requested a right knee joint cortisone injection.  She tolerated the procedure well.  Procedure note was completed above.  An aspiration was attempted but no fluid was obtained.  She was advised to notify us if her symptoms persist or worsen.  History  of calcium pyrophosphate deposition disease (CPPD) - She has chondrocalcinosis in both knees, right shoulder, and right hand evident on x-rays.  She presents today experiencing a pseudogout flare in the right knee.  She has warmth and a moderate effusion in the right knee with painful range of motion and limited extension. No fevers or chills. No erythema noted. She has been having a flare for the past 1 week.  She has tried taking ibuprofen as well as colchicine 0.6 mg 1 tablet at bedtime.  She has been tolerating colchicine without any side effects.  She has not noticed any improvement in her symptoms yet.  She is currently having difficulty ambulating and is using a walker to assist.  Different treatment options were discussed today.  A right knee joint aspiration was attempted by Dr. Corliss Skains but no fluid was obtained.  The right knee joint was injected with cortisone.  The procedure note was completed above.  Aftercare was discussed.  She was advised to notify us if her symptoms persist or worsen.  Medication monitoring encounter - CBC and CMP updated today. Plan: COMPLETE METABOLIC PANEL WITH GFR, CBC with Differential/Platelet  Primary osteoarthritis of both feet: She is not experiencing any increased discomfort in her feet at this time.  Trochanteric bursitis of both hips - Resolved.   Other medical conditions are listed as follows:  Osteopenia of multiple sites - DEXA ordered by Dr. Renae Gloss.  History of hypertension: BP was 105/64 today in the office.   History of diabetes mellitus: The patient's daughter reports checking her mother's blood sugar every morning.  Advised the patient to have her blood sugar checked  tonight and several times tomorrow to monitor her blood glucose closely following the cortisone injection today.  She was advised to watch her sugar and carbohydrate intake through the weekend.  History of hyperlipidemia  History of insomnia  History of diverticulitis    Orders: Orders Placed This Encounter  Procedures   Large Joint Inj   COMPLETE METABOLIC PANEL WITH GFR   CBC with Differential/Platelet   No orders of the defined types were placed in this encounter.   Follow-Up Instructions: Return in 6 months (on 01/08/2023) for Osteoarthritis, CPPD.   Gearldine Bienenstock, PA-C  Note - This record has been created using Dragon software.  Chart creation errors have been sought, but may not always  have been located. Such creation errors do not reflect on  the standard of medical care.

## 2022-07-08 ENCOUNTER — Encounter: Payer: Self-pay | Admitting: Physician Assistant

## 2022-07-08 ENCOUNTER — Ambulatory Visit: Payer: Medicare Other | Attending: Physician Assistant | Admitting: Physician Assistant

## 2022-07-08 VITALS — BP 105/64 | HR 73 | Resp 14 | Ht 62.0 in | Wt 140.6 lb

## 2022-07-08 DIAGNOSIS — Z5181 Encounter for therapeutic drug level monitoring: Secondary | ICD-10-CM

## 2022-07-08 DIAGNOSIS — M25561 Pain in right knee: Secondary | ICD-10-CM

## 2022-07-08 DIAGNOSIS — Z8719 Personal history of other diseases of the digestive system: Secondary | ICD-10-CM

## 2022-07-08 DIAGNOSIS — M19072 Primary osteoarthritis, left ankle and foot: Secondary | ICD-10-CM

## 2022-07-08 DIAGNOSIS — M19071 Primary osteoarthritis, right ankle and foot: Secondary | ICD-10-CM | POA: Diagnosis not present

## 2022-07-08 DIAGNOSIS — M25512 Pain in left shoulder: Secondary | ICD-10-CM

## 2022-07-08 DIAGNOSIS — M170 Bilateral primary osteoarthritis of knee: Secondary | ICD-10-CM

## 2022-07-08 DIAGNOSIS — Z8639 Personal history of other endocrine, nutritional and metabolic disease: Secondary | ICD-10-CM

## 2022-07-08 DIAGNOSIS — Z87898 Personal history of other specified conditions: Secondary | ICD-10-CM

## 2022-07-08 DIAGNOSIS — M19042 Primary osteoarthritis, left hand: Secondary | ICD-10-CM

## 2022-07-08 DIAGNOSIS — G8929 Other chronic pain: Secondary | ICD-10-CM

## 2022-07-08 DIAGNOSIS — M7061 Trochanteric bursitis, right hip: Secondary | ICD-10-CM

## 2022-07-08 DIAGNOSIS — M545 Low back pain, unspecified: Secondary | ICD-10-CM

## 2022-07-08 DIAGNOSIS — Z8739 Personal history of other diseases of the musculoskeletal system and connective tissue: Secondary | ICD-10-CM

## 2022-07-08 DIAGNOSIS — M19041 Primary osteoarthritis, right hand: Secondary | ICD-10-CM

## 2022-07-08 DIAGNOSIS — Z8679 Personal history of other diseases of the circulatory system: Secondary | ICD-10-CM

## 2022-07-08 DIAGNOSIS — M8589 Other specified disorders of bone density and structure, multiple sites: Secondary | ICD-10-CM

## 2022-07-08 DIAGNOSIS — M7062 Trochanteric bursitis, left hip: Secondary | ICD-10-CM

## 2022-07-08 MED ORDER — LIDOCAINE HCL 1 % IJ SOLN
3.0000 mL | INTRAMUSCULAR | Status: AC | PRN
  Administered 2022-07-08: 3 mL

## 2022-07-08 MED ORDER — TRIAMCINOLONE ACETONIDE 40 MG/ML IJ SUSP
40.0000 mg | INTRAMUSCULAR | Status: AC | PRN
  Administered 2022-07-08: 40 mg via INTRA_ARTICULAR

## 2022-07-09 ENCOUNTER — Telehealth: Payer: Self-pay

## 2022-07-09 ENCOUNTER — Telehealth: Payer: Self-pay | Admitting: Rheumatology

## 2022-07-09 LAB — CBC WITH DIFFERENTIAL/PLATELET
Absolute Monocytes: 592 cells/uL (ref 200–950)
Basophils Absolute: 41 cells/uL (ref 0–200)
Basophils Relative: 0.6 %
Eosinophils Absolute: 211 cells/uL (ref 15–500)
Eosinophils Relative: 3.1 %
HCT: 34.3 % — ABNORMAL LOW (ref 35.0–45.0)
Hemoglobin: 11.9 g/dL (ref 11.7–15.5)
Lymphs Abs: 2788 cells/uL (ref 850–3900)
MCH: 29.1 pg (ref 27.0–33.0)
MCHC: 34.7 g/dL (ref 32.0–36.0)
MCV: 83.9 fL (ref 80.0–100.0)
MPV: 11.6 fL (ref 7.5–12.5)
Monocytes Relative: 8.7 %
Neutro Abs: 3169 cells/uL (ref 1500–7800)
Neutrophils Relative %: 46.6 %
Platelets: 182 10*3/uL (ref 140–400)
RBC: 4.09 10*6/uL (ref 3.80–5.10)
RDW: 14 % (ref 11.0–15.0)
Total Lymphocyte: 41 %
WBC: 6.8 10*3/uL (ref 3.8–10.8)

## 2022-07-09 LAB — COMPLETE METABOLIC PANEL WITH GFR
AG Ratio: 1.4 (calc) (ref 1.0–2.5)
ALT: 6 U/L (ref 6–29)
AST: 10 U/L (ref 10–35)
Albumin: 4.4 g/dL (ref 3.6–5.1)
Alkaline phosphatase (APISO): 84 U/L (ref 37–153)
BUN/Creatinine Ratio: 10 (calc) (ref 6–22)
BUN: 18 mg/dL (ref 7–25)
CO2: 24 mmol/L (ref 20–32)
Calcium: 9.6 mg/dL (ref 8.6–10.4)
Chloride: 99 mmol/L (ref 98–110)
Creat: 1.73 mg/dL — ABNORMAL HIGH (ref 0.60–1.00)
Globulin: 3.1 g/dL (calc) (ref 1.9–3.7)
Glucose, Bld: 89 mg/dL (ref 65–99)
Potassium: 2.7 mmol/L — CL (ref 3.5–5.3)
Sodium: 141 mmol/L (ref 135–146)
Total Bilirubin: 0.6 mg/dL (ref 0.2–1.2)
Total Protein: 7.5 g/dL (ref 6.1–8.1)
eGFR: 30 mL/min/{1.73_m2} — ABNORMAL LOW (ref >=60)

## 2022-07-09 NOTE — Progress Notes (Signed)
Patient's daughter has been notified of low potassium results.   Creatinine is elevated-1.73 and GFR low at 30.  She should avoid the use of NSAIDs-discontinue ibuprofen.   CBC WNL.

## 2022-07-09 NOTE — Telephone Encounter (Signed)
See previous phone note from Marissa G.

## 2022-07-09 NOTE — Telephone Encounter (Signed)
I called patient's PCP's office and left a message for the nurse regarding patient's potassium level. Advised that we had also faxed over a copy of the lab results as well. Called patient's daughter and advised her to take potassium and get in touch with Dr. Mathews Robinsons office. Lorrie verbalized understanding.

## 2022-07-09 NOTE — Telephone Encounter (Signed)
Received a phone call from the answering service at 5:44 AM that patient's potassium was 2.7.  Please notify the patient.  If she has potassium she should take potassium supplement.  She should also contact her PCP.  Please call PCPs office to notify that patient's potassium is low. Pollyann Savoy, MD

## 2022-08-05 ENCOUNTER — Encounter (INDEPENDENT_AMBULATORY_CARE_PROVIDER_SITE_OTHER): Payer: Medicare Other | Admitting: Ophthalmology

## 2022-08-26 ENCOUNTER — Ambulatory Visit: Payer: Medicare Other | Admitting: Physician Assistant

## 2022-10-25 ENCOUNTER — Encounter (INDEPENDENT_AMBULATORY_CARE_PROVIDER_SITE_OTHER): Payer: Medicare Other | Admitting: Ophthalmology

## 2022-12-04 ENCOUNTER — Ambulatory Visit (INDEPENDENT_AMBULATORY_CARE_PROVIDER_SITE_OTHER): Payer: Self-pay | Admitting: Podiatry

## 2022-12-04 DIAGNOSIS — Z91199 Patient's noncompliance with other medical treatment and regimen due to unspecified reason: Secondary | ICD-10-CM

## 2022-12-06 NOTE — Progress Notes (Signed)
1. No-show for appointment

## 2022-12-28 NOTE — Progress Notes (Deleted)
Office Visit Note  Patient: [name] [name]             Date of Birth: November 26, 1945           MRN: [id number]             PCP: Willey Blade, MD Referring: Willey Blade, MD Visit Date: 01/11/2023 Occupation: @GUAROCC$ @  Subjective:  No chief complaint on file.   History of Present Illness: [name] [name] is a 77 y.o. female ***     Activities of Daily Living:  Patient reports morning stiffness for *** {minute/hour:19697}.   Patient {ACTIONS;DENIES/REPORTS:21021675::"Denies"} nocturnal pain.  Difficulty dressing/grooming: {ACTIONS;DENIES/REPORTS:21021675::"Denies"} Difficulty climbing stairs: {ACTIONS;DENIES/REPORTS:21021675::"Denies"} Difficulty getting out of chair: {ACTIONS;DENIES/REPORTS:21021675::"Denies"} Difficulty using hands for taps, buttons, cutlery, and/or writing: {ACTIONS;DENIES/REPORTS:21021675::"Denies"}  No Rheumatology ROS completed.   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 ([place]) 02/02/2022   Snores 11/05/2021   Pseudophakia of left eye 10/14/2021   Vitreous hemorrhage of left eye ([place]) 09/15/2021   Vitreomacular adhesion of right eye 09/15/2021   Proliferative diabetic retinopathy, left eye ([place]) 09/15/2021   Age-related nuclear cataract, right 09/15/2021   Overweight 12/17/2020   Vitamin D deficiency 12/17/2020   CPPD knees hands 12/08/2016   History of hypertension 12/08/2016   History of diabetes mellitus 12/08/2016   Dyslipidemia 12/08/2016   Osteoarthritis of both hands 09/14/2016   Osteoarthritis of both feet 09/14/2016   Osteoarthritis of both knees 09/14/2016   PCP NOTES >>> 08/19/2015   Low back pain 09/09/2014   Neuralgia of left saphenous nerve 06/05/2014   Numbness and tingling in left arm 06/05/2014   Paresthesia of left arm 05/21/2014   Gastroparesis 02/20/2013   Insomnia 02/20/2013   Annual physical exam 03/31/2012   History of  diverticulitis 04/14/2010   Osteopenia 05/14/2009   DYSPHAGIA UNSPECIFIED 01/10/2009   Pseudogout and DJD 09/12/2008   Hyperlipidemia 06/14/2007   Essential hypertension 06/14/2007   Osteoarthritis 06/14/2007   DM (diabetes mellitus) ([place]) 01/02/2007    Past Medical History:  Diagnosis Date   Bilateral knee pain    Euflexxa x3   Chest pain 06/2007   CP and QW on EKG: neg stress test    Diabetes mellitus    w/ neuropathy   Diverticulitis 2000   Gout    Hyperlipidemia    Hypertension    Insomnia 02/20/2013   Osteoarthritis    gout, CPPD--- sees Dr.Davenshwar   Osteoarthritis of both feet 09/14/2016   Osteoarthritis of both hands 09/14/2016   Osteoarthritis of both knees 09/14/2016   Osteopenia    per DEXA 4/10   Recurrent boils    h/o MRSA   Type 2 diabetes mellitus without complication ([place]) [id number]    Family History  Problem Relation Age of Onset   Hypertension Mother    Diabetes Mother    Hypertension Father    Diabetes Father    Hypertension Daughter    Hypertension Sister    Diabetes Sister    Coronary artery disease Neg Hx    Stroke Neg Hx    Colon cancer Neg Hx    Breast cancer Neg Hx    Past Surgical History:  Procedure Laterality Date   CHOLECYSTECTOMY     TONSILLECTOMY     Social History   Social History Narrative   Lives w/ husband   Patient is married with 3 children.   Patient is right handed.   Patient has college  education.   Patient drinks 3-4 daily.   Immunization History  Administered Date(s) Administered   Influenza Split 08/01/2012   Influenza Whole 10/14/2009   Influenza, High Dose Seasonal PF 09/12/2013, 08/19/2015, 09/14/2018   Influenza,inj,Quad PF,6+ Mos 09/24/2014   Influenza-Unspecified 10/01/2019   PFIZER(Purple Top)SARS-COV-2 Vaccination 12/11/2019, 01/01/2020   Pneumococcal Conjugate-13 01/15/2014   Pneumococcal Polysaccharide-23 03/31/2012   Td 08/14/2010     Objective: Vital Signs: There were no vitals taken for  this visit.   Physical Exam   Musculoskeletal Exam: ***  CDAI Exam: CDAI Score: -- Patient Global: --; Provider Global: -- Swollen: --; Tender: -- Joint Exam 01/11/2023   No joint exam has been documented for this visit   There is currently no information documented on the homunculus. Go to the Rheumatology activity and complete the homunculus joint exam.  Investigation: No additional findings.  Imaging: No results found.  Recent Labs: Lab Results  Component Value Date   WBC 6.8 07/08/2022   HGB 11.9 07/08/2022   PLT 182 07/08/2022   NA 141 07/08/2022   K 2.7 (LL) 07/08/2022   CL 99 07/08/2022   CO2 24 07/08/2022   GLUCOSE 89 07/08/2022   BUN 18 07/08/2022   CREATININE 1.73 (H) 07/08/2022   BILITOT 0.6 07/08/2022   ALKPHOS 122 (H) 11/16/2018   AST 10 07/08/2022   ALT 6 07/08/2022   PROT 7.5 07/08/2022   ALBUMIN 4.2 11/16/2018   CALCIUM 9.6 07/08/2022   GFRAA 98 10/14/2020    Speciality Comments: No specialty comments available.  Procedures:  No procedures performed Allergies: Colchicine, Hydrocodone, Sulindac, Tramadol, Celecoxib, and Glimepiride   Assessment / Plan:     Visit Diagnoses: Primary osteoarthritis of both hands  Chronic left shoulder pain  Primary osteoarthritis of both knees  Primary osteoarthritis of both feet  History of calcium pyrophosphate deposition disease (CPPD)  Trochanteric bursitis of both hips  Osteopenia of multiple sites  History of hypertension  History of diabetes mellitus  History of hyperlipidemia  History of insomnia  History of diverticulitis  Orders: No orders of the defined types were placed in this encounter.  No orders of the defined types were placed in this encounter.   Face-to-face time spent with patient was *** minutes. Greater than 50% of time was spent in counseling and coordination of care.  Follow-Up Instructions: No follow-ups on file.   Ofilia Neas, PA-C  Note - This record has  been created using Dragon software.  Chart creation errors have been sought, but may not always  have been located. Such creation errors do not reflect on  the standard of medical care.

## 2022-12-30 ENCOUNTER — Ambulatory Visit: Payer: Medicare Other | Admitting: Family Medicine

## 2023-01-11 ENCOUNTER — Ambulatory Visit: Payer: Medicare Other | Admitting: Rheumatology

## 2023-01-11 DIAGNOSIS — Z8739 Personal history of other diseases of the musculoskeletal system and connective tissue: Secondary | ICD-10-CM

## 2023-01-11 DIAGNOSIS — G8929 Other chronic pain: Secondary | ICD-10-CM

## 2023-01-11 DIAGNOSIS — M7061 Trochanteric bursitis, right hip: Secondary | ICD-10-CM

## 2023-01-11 DIAGNOSIS — M170 Bilateral primary osteoarthritis of knee: Secondary | ICD-10-CM

## 2023-01-11 DIAGNOSIS — Z8679 Personal history of other diseases of the circulatory system: Secondary | ICD-10-CM

## 2023-01-11 DIAGNOSIS — M19071 Primary osteoarthritis, right ankle and foot: Secondary | ICD-10-CM

## 2023-01-11 DIAGNOSIS — M8589 Other specified disorders of bone density and structure, multiple sites: Secondary | ICD-10-CM

## 2023-01-11 DIAGNOSIS — Z8719 Personal history of other diseases of the digestive system: Secondary | ICD-10-CM

## 2023-01-11 DIAGNOSIS — Z87898 Personal history of other specified conditions: Secondary | ICD-10-CM

## 2023-01-11 DIAGNOSIS — M19041 Primary osteoarthritis, right hand: Secondary | ICD-10-CM

## 2023-01-11 DIAGNOSIS — Z8639 Personal history of other endocrine, nutritional and metabolic disease: Secondary | ICD-10-CM

## 2023-01-13 ENCOUNTER — Ambulatory Visit (INDEPENDENT_AMBULATORY_CARE_PROVIDER_SITE_OTHER): Payer: Medicare Other | Admitting: Family Medicine

## 2023-01-13 ENCOUNTER — Encounter: Payer: Self-pay | Admitting: Family Medicine

## 2023-01-13 VITALS — BP 132/76 | HR 75 | Temp 97.6°F | Ht 62.0 in | Wt 146.0 lb

## 2023-01-13 DIAGNOSIS — R4189 Other symptoms and signs involving cognitive functions and awareness: Secondary | ICD-10-CM

## 2023-01-13 DIAGNOSIS — E785 Hyperlipidemia, unspecified: Secondary | ICD-10-CM | POA: Diagnosis not present

## 2023-01-13 DIAGNOSIS — E1149 Type 2 diabetes mellitus with other diabetic neurological complication: Secondary | ICD-10-CM

## 2023-01-13 DIAGNOSIS — N183 Chronic kidney disease, stage 3 unspecified: Secondary | ICD-10-CM

## 2023-01-13 DIAGNOSIS — I10 Essential (primary) hypertension: Secondary | ICD-10-CM

## 2023-01-13 DIAGNOSIS — E559 Vitamin D deficiency, unspecified: Secondary | ICD-10-CM | POA: Diagnosis not present

## 2023-01-13 DIAGNOSIS — E10618 Type 1 diabetes mellitus with other diabetic arthropathy: Secondary | ICD-10-CM | POA: Insufficient documentation

## 2023-01-13 DIAGNOSIS — M1120 Other chondrocalcinosis, unspecified site: Secondary | ICD-10-CM

## 2023-01-13 LAB — COMPREHENSIVE METABOLIC PANEL
ALT: 7 U/L (ref 0–35)
AST: 9 U/L (ref 0–37)
Albumin: 4 g/dL (ref 3.5–5.2)
Alkaline Phosphatase: 96 U/L (ref 39–117)
BUN: 5 mg/dL — ABNORMAL LOW (ref 6–23)
CO2: 30 mEq/L (ref 19–32)
Calcium: 10 mg/dL (ref 8.4–10.5)
Chloride: 103 mEq/L (ref 96–112)
Creatinine, Ser: 0.78 mg/dL (ref 0.40–1.20)
GFR: 74.22 mL/min (ref >60.00)
Glucose, Bld: 157 mg/dL — ABNORMAL HIGH (ref 70–99)
Potassium: 3.7 mEq/L (ref 3.5–5.1)
Sodium: 145 mEq/L (ref 135–145)
Total Bilirubin: 0.4 mg/dL (ref 0.2–1.2)
Total Protein: 7.5 g/dL (ref 6.0–8.3)

## 2023-01-13 LAB — URINALYSIS, ROUTINE W REFLEX MICROSCOPIC
Bilirubin Urine: NEGATIVE
Hgb urine dipstick: NEGATIVE
Ketones, ur: NEGATIVE
Nitrite: NEGATIVE
Specific Gravity, Urine: 1.025 (ref 1.000–1.030)
Total Protein, Urine: NEGATIVE
Urine Glucose: NEGATIVE
Urobilinogen, UA: 0.2 (ref 0.0–1.0)
pH: 6.5 (ref 5.0–8.0)

## 2023-01-13 LAB — LIPID PANEL
Cholesterol: 202 mg/dL — ABNORMAL HIGH (ref 0–200)
HDL: 76.3 mg/dL (ref >39.00)
LDL Cholesterol: 111 mg/dL — ABNORMAL HIGH (ref 0–99)
NonHDL: 125.96
Total CHOL/HDL Ratio: 3
Triglycerides: 75 mg/dL (ref 0.0–149.0)
VLDL: 15 mg/dL (ref 0.0–40.0)

## 2023-01-13 LAB — T4, FREE: Free T4: 1.04 ng/dL (ref 0.60–1.60)

## 2023-01-13 LAB — CBC WITH DIFFERENTIAL/PLATELET
Basophils Absolute: 0.1 10*3/uL (ref 0.0–0.1)
Basophils Relative: 1.1 % (ref 0.0–3.0)
Eosinophils Absolute: 0.2 10*3/uL (ref 0.0–0.7)
Eosinophils Relative: 2.9 % (ref 0.0–5.0)
HCT: 35.9 % — ABNORMAL LOW (ref 36.0–46.0)
Hemoglobin: 12.3 g/dL (ref 12.0–15.0)
Lymphocytes Relative: 39 % (ref 12.0–46.0)
Lymphs Abs: 2.6 10*3/uL (ref 0.7–4.0)
MCHC: 34.2 g/dL (ref 30.0–36.0)
MCV: 86.8 fl (ref 78.0–100.0)
Monocytes Absolute: 0.5 10*3/uL (ref 0.1–1.0)
Monocytes Relative: 6.8 % (ref 3.0–12.0)
Neutro Abs: 3.4 10*3/uL (ref 1.4–7.7)
Neutrophils Relative %: 50.2 % (ref 43.0–77.0)
Platelets: 276 10*3/uL (ref 150.0–400.0)
RBC: 4.13 Mil/uL (ref 3.87–5.11)
RDW: 13.9 % (ref 11.5–15.5)
WBC: 6.8 10*3/uL (ref 4.0–10.5)

## 2023-01-13 LAB — VITAMIN B12: Vitamin B-12: 188 pg/mL — ABNORMAL LOW (ref 211–911)

## 2023-01-13 LAB — TSH: TSH: 1.39 u[IU]/mL (ref 0.35–5.50)

## 2023-01-13 LAB — VITAMIN D 25 HYDROXY (VIT D DEFICIENCY, FRACTURES): VITD: 13.69 ng/mL — ABNORMAL LOW (ref 30.00–100.00)

## 2023-01-13 LAB — MICROALBUMIN / CREATININE URINE RATIO
Creatinine,U: 107.7 mg/dL
Microalb Creat Ratio: 0.9 mg/g (ref 0.0–30.0)
Microalb, Ur: 0.9 mg/dL (ref 0.0–1.9)

## 2023-01-13 LAB — HEMOGLOBIN A1C: Hgb A1c MFr Bld: 8.9 % — ABNORMAL HIGH (ref 4.6–6.5)

## 2023-01-13 NOTE — Assessment & Plan Note (Signed)
Reportedly started 2 years ago with getting lost while driving. Worsening memory per family. MRI ordered. Reviewed MRI from 2015.  Check labs to look for reversible causes. Referral to neurology.

## 2023-01-13 NOTE — Assessment & Plan Note (Signed)
Controlled. Check BMP. Monitor BP at home.

## 2023-01-13 NOTE — Assessment & Plan Note (Signed)
Patient and daughter were unaware of this diagnosis. Discussed good HTN and DM control and avoiding nephrotoxic medications. Check renal function.

## 2023-01-13 NOTE — Assessment & Plan Note (Signed)
Check lipid panel. She is not on statin therapy.

## 2023-01-13 NOTE — Assessment & Plan Note (Signed)
Continue current medications and check A1c today. Urine microalbumin ordered.

## 2023-01-13 NOTE — Progress Notes (Signed)
New Patient Office Visit  Subjective    Patient ID: [name] [name], female    DOB: [birth year]  Age: 77 y.o. MRN: [id number]  CC:  Chief Complaint  Patient presents with   Establish Care    Wants to check on short term memory    HPI [name] [name] presents to establish care Previous medical care: Dr. Willey Blade   Lives with her husband. Her daughter Cecille Rubin is with her today.  Her other daughter Lattie Haw is on speaker phone.  Main concern today is memory loss.  Daughter reports patient was getting lost 2 years ago so she stopped driving.  They report her short-term memory is getting worse.  Forgetful.  Denies seeing neurology or being on medication.  DM type 2- does not check BS at home.  Taking Januvia 100 mg and glipizide XL 2.5 mg    Gout- takes colchicine prn. 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      01/13/2023    9:14 AM 09/14/2018   10:36 AM  6CIT Screen  What Year? 4 points 0 points  What month? 0 points 0 points  What time? 3 points 3 points  Count back from 20 0 points 0 points  Months in reverse 2 points 2 points  Repeat phrase 4 points 2 points  Total Score 13 points 7 points       Outpatient Encounter Medications as of 01/13/2023  Medication Sig   aspirin 81 MG tablet Take 81 mg by mouth daily.   diltiazem (TIAZAC) 300 MG 24 hr capsule TAKE 1 CAPSULE BY MOUTH EVERY DAY (Patient taking differently: Take 300 mg by mouth daily.)   glipiZIDE (GLUCOTROL XL) 2.5 MG 24 hr tablet Take 2.5 mg by mouth 2 (two) times daily.   glucose blood (ONETOUCH VERIO) test strip USE TO TEST BLOOD SUGAR 3 TIMES DAILY   JANUVIA 100 MG tablet Take 100 mg by mouth daily.   Potassium 99 MG TABS Take by mouth.   colchicine 0.6 MG tablet Take a half tablet (0.3 mg total) by mouth daily. (Patient not taking: Reported on 01/13/2023)   [DISCONTINUED] Cholecalciferol (VITAMIN D) 2000 UNITS CAPS Take  2,000 Units by mouth 2 (two) times a week. Reported on 11/18/2015   [DISCONTINUED] donepezil (ARICEPT) 5 MG tablet  (Patient not taking: Reported on 07/08/2022)   [DISCONTINUED] SitaGLIPtin-MetFORMIN HCl (JANUMET XR) 50-500 MG TB24 Take 2 tablets by mouth daily. (Patient not taking: Reported on 07/08/2022)   [DISCONTINUED] trolamine salicylate (ASPERCREME) 10 % cream Apply 1 application topically as needed for muscle pain. (Patient not taking: Reported on 01/13/2023)   No facility-administered encounter medications on file as of 01/13/2023.    Past Medical History:  Diagnosis Date   Bilateral knee pain    Euflexxa x3   Chest pain 06/2007   CP and QW on EKG: neg stress test    Diabetes mellitus    w/ neuropathy   Diverticulitis 2000   Gout    Hyperlipidemia    Hypertension    Insomnia 02/20/2013   Osteoarthritis    gout, CPPD--- sees Dr.Davenshwar   Osteoarthritis of both feet 09/14/2016   Osteoarthritis of both hands 09/14/2016   Osteoarthritis of both knees 09/14/2016   Osteopenia    per DEXA 4/10   Recurrent boils    h/o MRSA   Type 2 diabetes mellitus without complication ([place]) 123XX123  Past Surgical History:  Procedure Laterality Date   CHOLECYSTECTOMY     TONSILLECTOMY      Family History  Problem Relation Age of Onset   Hypertension Mother    Diabetes Mother    Hypertension Father    Diabetes Father    Hypertension Daughter    Hypertension Sister    Diabetes Sister    Coronary artery disease Neg Hx    Stroke Neg Hx    Colon cancer Neg Hx    Breast cancer Neg Hx     Social History   Socioeconomic History   Marital status: Married    Spouse name: herbert   Number of children: 3   Years of education: college   Highest education level: Not on file  Occupational History   Occupation: Retired- 2009, doing part time work sometimes     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   Patient is right handed.   Patient has college education.   Patient drinks 3-4 daily.   Social Determinants of Health   Financial Resource Strain: Low Risk  (09/14/2018)   Overall Financial Resource Strain (CARDIA)    Difficulty of Paying Living Expenses: Not hard at all  Food Insecurity: No Food Insecurity (09/14/2018)   Hunger Vital Sign    Worried About Running Out of Food in the Last Year: Never true    Ran Out of Food in the Last Year: Never true  Transportation Needs: No Transportation Needs (09/14/2018)   PRAPARE - Hydrologist (Medical): No    Lack of Transportation (Non-Medical): No  Physical Activity: Inactive (09/14/2018)   Exercise Vital Sign    Days of Exercise per Week: 0 days    Minutes of Exercise per Session: 0 min  Stress: No Stress Concern Present (09/14/2018)   [place]    Feeling of Stress : Not at all  Social Connections: Not on file  Intimate Partner Violence: Not At Risk (09/14/2018)   Humiliation, Afraid, Rape, and Kick questionnaire    Fear of Current or Ex-Partner: No    Emotionally Abused: No    Physically Abused: No    Sexually Abused: No    ROS      Objective    BP 132/76 (BP Location: Left Arm, Patient Position: Sitting, Cuff Size: Large)   Pulse 75   Temp 97.6 F (36.4 C) (Temporal)   Ht '5\' 2"'$  (1.575 m)   Wt 146 lb (66.2 kg)   SpO2 99%   BMI 26.70 kg/m   Physical Exam Constitutional:      General: She is not in acute distress.    Appearance: She is not ill-appearing.  HENT:     Mouth/Throat:     Mouth: Mucous membranes are moist.      Pharynx: Oropharynx is clear.  Eyes:     Extraocular Movements: Extraocular movements intact.     Conjunctiva/sclera: Conjunctivae normal.     Pupils: Pupils are  equal, round, and reactive to light.  Cardiovascular:     Rate and Rhythm: Normal rate and regular rhythm.  Pulmonary:     Effort: Pulmonary effort is normal.     Breath sounds: Normal breath sounds.  Musculoskeletal:        General: Normal range of motion.     Cervical back: Normal range of motion and neck supple. No tenderness.     Right lower leg: No edema.     Left lower leg: No edema.  Lymphadenopathy:     Cervical: No cervical adenopathy.  Skin:    General: Skin is warm and dry.  Neurological:     General: No focal deficit present.     Mental Status: She is alert. She is disoriented.     Cranial Nerves: No cranial nerve deficit or facial asymmetry.     Sensory: No sensory deficit.     Motor: No weakness, tremor or pronator drift.     Coordination: Coordination normal.     Gait: Gait is intact.  Psychiatric:        Attention and Perception: Attention normal.        Mood and Affect: Mood normal.        Speech: Speech normal.        Behavior: Behavior normal.         Assessment & Plan:   Problem List Items Addressed This Visit       Cardiovascular and Mediastinum   Essential hypertension    Controlled. Check BMP. Monitor BP at home.       Relevant Orders   CBC with Differential/Platelet (Completed)   Comprehensive metabolic panel (Completed)     Endocrine   DM (diabetes mellitus) ([place])    Continue current medications and check A1c today. Urine microalbumin ordered.       Relevant Orders   Hemoglobin A1c (Completed)   Microalbumin / creatinine urine ratio (Completed)     Musculoskeletal and Integument   Pseudogout and DJD     Genitourinary   Stage 3 chronic kidney disease ([place])    Patient and daughter were unaware of this diagnosis. Discussed good HTN and DM control and avoiding  nephrotoxic medications. Check renal function.       Relevant Orders   Comprehensive metabolic panel (Completed)     Other   Cognitive changes - Primary    Reportedly started 2 years ago with getting lost while driving. Worsening memory per family. MRI ordered. Reviewed MRI from 2015.  Check labs to look for reversible causes. Referral to neurology.       Relevant Orders   CBC with Differential/Platelet (Completed)   Comprehensive metabolic panel (Completed)   TSH (Completed)   T4, free (Completed)   Vitamin B12 (Completed)   RPR   MR Brain W Wo Contrast   Urinalysis, Routine w reflex microscopic (Completed)   Ambulatory referral to Neurology   Dyslipidemia    Check lipid panel. She is not on statin therapy.       Relevant Orders   Lipid panel (Completed)   Vitamin D deficiency    Check vitamin D level.       Relevant Orders   VITAMIN D 25 Hydroxy (Vit-D Deficiency, Fractures) (Completed)   Reviewed EMR including rheumatologist notes, podiatrist, retina spec, as well as previous PCP.   Visit time 30 minutes in face to face communication with patient and coordination of care, additional 15 minutes spent in record review, coordination or care, ordering tests,  communicating/referring to other healthcare professionals, documenting in medical records all on the same day of the visit for total time 45 minutes spent on the visit.    Return in about 3 months (around 04/13/2023).   Harland Dingwall, NP-C

## 2023-01-13 NOTE — Progress Notes (Signed)
Several abnormal labs. Please ask her to come in for follow up in one week to discuss recommendations. Her B12 is low, vitamin D is low and we need to work on lowering blood sugars. I would also like to recheck her urine at her follow up.

## 2023-01-13 NOTE — Assessment & Plan Note (Signed)
Check vitamin D level 

## 2023-01-13 NOTE — Patient Instructions (Signed)
Thank you for trusting [place] with your health care.  Please go downstairs for labs and a urine check before you leave.  You will be called by [facility] imaging to schedule a brain MRI.  I have also referred you to [facility] neurology and they will call you to schedule a visit.  I will see you back in 3 months or sooner if needed.

## 2023-01-14 LAB — RPR: RPR Ser Ql: NONREACTIVE

## 2023-01-20 ENCOUNTER — Encounter: Payer: Self-pay | Admitting: Family Medicine

## 2023-01-20 ENCOUNTER — Ambulatory Visit (INDEPENDENT_AMBULATORY_CARE_PROVIDER_SITE_OTHER): Payer: Medicare Other | Admitting: Family Medicine

## 2023-01-20 VITALS — BP 140/82 | HR 92 | Temp 97.8°F | Ht 62.0 in | Wt 146.0 lb

## 2023-01-20 DIAGNOSIS — E1165 Type 2 diabetes mellitus with hyperglycemia: Secondary | ICD-10-CM | POA: Diagnosis not present

## 2023-01-20 DIAGNOSIS — E538 Deficiency of other specified B group vitamins: Secondary | ICD-10-CM | POA: Diagnosis not present

## 2023-01-20 DIAGNOSIS — E785 Hyperlipidemia, unspecified: Secondary | ICD-10-CM

## 2023-01-20 DIAGNOSIS — E1169 Type 2 diabetes mellitus with other specified complication: Secondary | ICD-10-CM

## 2023-01-20 DIAGNOSIS — I10 Essential (primary) hypertension: Secondary | ICD-10-CM

## 2023-01-20 DIAGNOSIS — R82998 Other abnormal findings in urine: Secondary | ICD-10-CM | POA: Diagnosis not present

## 2023-01-20 DIAGNOSIS — E559 Vitamin D deficiency, unspecified: Secondary | ICD-10-CM | POA: Diagnosis not present

## 2023-01-20 LAB — POCT URINALYSIS DIPSTICK
Bilirubin, UA: NEGATIVE
Blood, UA: NEGATIVE
Glucose, UA: NEGATIVE
Ketones, UA: NEGATIVE
Nitrite, UA: NEGATIVE
Protein, UA: NEGATIVE
Spec Grav, UA: 1.025 (ref 1.010–1.025)
Urobilinogen, UA: 0.2 E.U./dL
pH, UA: 6 (ref 5.0–8.0)

## 2023-01-20 MED ORDER — PRAVASTATIN SODIUM 10 MG PO TABS: 10.0000 mg | ORAL_TABLET | Freq: Every day | ORAL | 1 refills | Status: DC

## 2023-01-20 MED ORDER — DEXCOM G7 SENSOR MISC: 4 refills | Status: DC

## 2023-01-20 MED ORDER — DEXCOM G7 RECEIVER DEVI: 0 refills | Status: DC

## 2023-01-20 MED ORDER — CYANOCOBALAMIN 1000 MCG/ML IJ SOLN
1000.0000 ug | Freq: Once | INTRAMUSCULAR | Status: AC
  Administered 2023-01-20: 1000 ug via INTRAMUSCULAR

## 2023-01-20 MED ORDER — VITAMIN D (ERGOCALCIFEROL) 1.25 MG (50000 UNIT) PO CAPS: 50000.0000 [IU] | ORAL_CAPSULE | ORAL | 2 refills | Status: DC

## 2023-01-20 NOTE — Patient Instructions (Signed)
Take the once weekly vitamin D prescription tablet.   Start the cholesterol medication daily called pravastatin.   Cut back on sweets, sugar, potatoes, pasta, bread, rice.   Start a multivitamin   Diabetes Mellitus and Nutrition, Adult When you have diabetes, or diabetes mellitus, it is very important to have healthy eating habits because your blood sugar (glucose) levels are greatly affected by what you eat and drink. Eating healthy foods in the right amounts, at about the same times every day, can help you: Manage your blood glucose. Lower your risk of heart disease. Improve your blood pressure. Reach or maintain a healthy weight. What can affect my meal plan? Every person with diabetes is different, and each person has different needs for a meal plan. Your health care provider may recommend that you work with a dietitian to make a meal plan that is best for you. Your meal plan may vary depending on factors such as: The calories you need. The medicines you take. Your weight. Your blood glucose, blood pressure, and cholesterol levels. Your activity level. Other health conditions you have, such as heart or kidney disease. How do carbohydrates affect me? Carbohydrates, also called carbs, affect your blood glucose level more than any other type of food. Eating carbs raises the amount of glucose in your blood. It is important to know how many carbs you can safely have in each meal. This is different for every person. Your dietitian can help you calculate how many carbs you should have at each meal and for each snack. How does alcohol affect me? Alcohol can cause a decrease in blood glucose (hypoglycemia), especially if you use insulin or take certain diabetes medicines by mouth. Hypoglycemia can be a life-threatening condition. Symptoms of hypoglycemia, such as sleepiness, dizziness, and confusion, are similar to symptoms of having too much alcohol. Do not drink alcohol if: Your health care  provider tells you not to drink. You are pregnant, may be pregnant, or are planning to become pregnant. If you drink alcohol: Limit how much you have to: 0-1 drink a day for women. 0-2 drinks a day for men. Know how much alcohol is in your drink. In the [place] one drink equals one 12 oz bottle of beer (355 mL), one 5 oz glass of wine (148 mL), or one 1 oz glass of hard liquor (44 mL). Keep yourself hydrated with water, diet soda, or unsweetened iced tea. Keep in mind that regular soda, juice, and other mixers may contain a lot of sugar and must be counted as carbs. What are tips for following this plan?  Reading food labels Start by checking the serving size on the Nutrition Facts label of packaged foods and drinks. The number of calories and the amount of carbs, fats, and other nutrients listed on the label are based on one serving of the item. Many items contain more than one serving per package. Check the total grams (g) of carbs in one serving. Check the number of grams of saturated fats and trans fats in one serving. Choose foods that have a low amount or none of these fats. Check the number of milligrams (mg) of salt (sodium) in one serving. Most people should limit total sodium intake to less than 2,300 mg per day. Always check the nutrition information of foods labeled as "low-fat" or "nonfat." These foods may be higher in added sugar or refined carbs and should be avoided. Talk to your dietitian to identify your daily goals for nutrients listed on  the label. Shopping Avoid buying canned, pre-made, or processed foods. These foods tend to be high in fat, sodium, and added sugar. Shop around the outside edge of the grocery store. This is where you will most often find fresh fruits and vegetables, bulk grains, fresh meats, and fresh dairy products. Cooking Use low-heat cooking methods, such as baking, instead of high-heat cooking methods, such as deep frying. Cook using healthy oils, such  as olive, canola, or sunflower oil. Avoid cooking with butter, cream, or high-fat meats. Meal planning Eat meals and snacks regularly, preferably at the same times every day. Avoid going long periods of time without eating. Eat foods that are high in fiber, such as fresh fruits, vegetables, beans, and whole grains. Eat 4-6 oz (112-168 g) of lean protein each day, such as lean meat, chicken, fish, eggs, or tofu. One ounce (oz) (28 g) of lean protein is equal to: 1 oz (28 g) of meat, chicken, or fish. 1 egg.  cup (62 g) of tofu. Eat some foods each day that contain healthy fats, such as avocado, nuts, seeds, and fish. What foods should I eat? Fruits Berries. Apples. Oranges. Peaches. Apricots. Plums. Grapes. Mangoes. Papayas. Pomegranates. Kiwi. Cherries. Vegetables Leafy greens, including lettuce, spinach, kale, chard, collard greens, mustard greens, and cabbage. Beets. Cauliflower. Broccoli. Carrots. Green beans. Tomatoes. Peppers. Onions. Cucumbers. Brussels sprouts. Grains Whole grains, such as whole-wheat or whole-grain bread, crackers, tortillas, cereal, and pasta. Unsweetened oatmeal. Quinoa. Brown or wild rice. Meats and other proteins Seafood. Poultry without skin. Lean cuts of poultry and beef. Tofu. Nuts. Seeds. Dairy Low-fat or fat-free dairy products such as milk, yogurt, and cheese. The items listed above may not be a complete list of foods and beverages you can eat and drink. Contact a dietitian for more information. What foods should I avoid? Fruits Fruits canned with syrup. Vegetables Canned vegetables. Frozen vegetables with butter or cream sauce. Grains Refined white flour and flour products such as bread, pasta, snack foods, and cereals. Avoid all processed foods. Meats and other proteins Fatty cuts of meat. Poultry with skin. Breaded or fried meats. Processed meat. Avoid saturated fats. Dairy Full-fat yogurt, cheese, or milk. Beverages Sweetened drinks, such as  soda or iced tea. The items listed above may not be a complete list of foods and beverages you should avoid. Contact a dietitian for more information. Questions to ask a health care provider Do I need to meet with a certified diabetes care and education specialist? Do I need to meet with a dietitian? What number can I call if I have questions? When are the best times to check my blood glucose? Where to find more information: American Diabetes Association: diabetes.org Academy of Nutrition and Dietetics: eatright.[employer] of Diabetes and Digestive and Kidney Diseases: [employer] Association of Diabetes Care & Education Specialists: diabeteseducator.org Summary It is important to have healthy eating habits because your blood sugar (glucose) levels are greatly affected by what you eat and drink. It is important to use alcohol carefully. A healthy meal plan will help you manage your blood glucose and lower your risk of heart disease. Your health care provider may recommend that you work with a dietitian to make a meal plan that is best for you. This information is not intended to replace advice given to you by your health care provider. Make sure you discuss any questions you have with your health care provider. Document Revised: 06/04/2020 Document Reviewed: 06/04/2020 Elsevier Patient Education  [place].

## 2023-01-20 NOTE — Assessment & Plan Note (Signed)
UA with small leuks. Negative nit, rbc, pro, glucose. Asymptomatic.

## 2023-01-20 NOTE — Assessment & Plan Note (Signed)
Mildly elevated. Recommend cutting back on sodium.

## 2023-01-20 NOTE — Assessment & Plan Note (Signed)
Start on pravastatin. Cut back on foods high in fats and processed foods.

## 2023-01-20 NOTE — Assessment & Plan Note (Signed)
Start prescription vitamin D x 12 weeks.

## 2023-01-20 NOTE — Assessment & Plan Note (Signed)
B12 injections weekly x 4 wks. First injection today.  Start OTC MVI

## 2023-01-20 NOTE — Progress Notes (Signed)
Subjective:     Patient ID: [name] [name], female    DOB: 06/17/1946, 77 y.o.   MRN: [id number]  Chief Complaint  Patient presents with   Medical Management of Chronic Issues    B12, vitamin D, A1c Not checking BS at home, interested in CGM    HPI Patient is in today for follow up on abnormal labs and UA. Her daughter is with her.   Denies any urinary symptoms.   B12 low. Vitamin D is low.   DM is uncontrolled. She reports eating a lot of sweets and junk food. Drinks soda.  She does not check BS at home.   LDL not at goal and has not been on a statin.   Denies fever, chills, dizziness, chest pain, palpitations, shortness of breath, abdominal pain, N/V/D, LE edema.         Health Maintenance Due  Topic Date Due   MAMMOGRAM  10/05/2016   OPHTHALMOLOGY EXAM  11/01/2017   DEXA SCAN  08/06/2018   Pneumonia Vaccine 41+ Years old (44 of 3 - PPSV23 or PCV20) 01/16/2019   Medicare Annual Wellness (AWV)  09/15/2019   COLONOSCOPY (Pts 45-91yr Insurance coverage will need to be confirmed)  12/15/2020   FOOT EXAM  06/02/2022    Past Medical History:  Diagnosis Date   Bilateral knee pain    Euflexxa x3   Chest pain 06/2007   CP and QW on EKG: neg stress test    Diabetes mellitus    w/ neuropathy   Diverticulitis 2000   Gout    Hyperlipidemia    Hypertension    Insomnia 02/20/2013   Osteoarthritis    gout, CPPD--- sees Dr.Davenshwar   Osteoarthritis of both feet 09/14/2016   Osteoarthritis of both hands 09/14/2016   Osteoarthritis of both knees 09/14/2016   Osteopenia    per DEXA 4/10   Recurrent boils    h/o MRSA   [id number]   Past Surgical History:  Procedure Laterality Date   CHOLECYSTECTOMY     TONSILLECTOMY      Family History  Problem Relation Age of Onset   Hypertension Mother    Diabetes Mother    Hypertension Father    Diabetes Father    Hypertension Daughter    Hypertension Sister     Diabetes Sister    Coronary artery disease Neg Hx    Stroke Neg Hx    Colon cancer Neg Hx    Breast cancer Neg Hx     Social History   Socioeconomic History   Marital status: Married    Spouse name: herbert   Number of children: 3   Years of education: college   Highest education level: Not on file  Occupational History   Occupation: Retired- 2009, doing part time work sometimes     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   Patient is right handed.   Patient has college education.   Patient drinks 3-4 daily.  Social Determinants of Health   Financial Resource Strain: Low Risk  (09/14/2018)   Overall Financial Resource Strain (CARDIA)    Difficulty of Paying Living Expenses: Not hard at all  Food Insecurity: No Food Insecurity (09/14/2018)   Hunger Vital Sign    Worried About Running Out of Food in the Last Year: Never true    Ran Out of Food in the Last Year: Never true  Transportation Needs: No Transportation Needs (09/14/2018)   PRAPARE - Hydrologist (Medical): No    Lack of Transportation (Non-Medical): No  Physical Activity: Inactive (09/14/2018)   Exercise Vital Sign    Days of Exercise per Week: 0 days    Minutes of Exercise per Session: 0 min  Stress: No Stress Concern Present (09/14/2018)   [place]    Feeling of Stress : Not at all  Social Connections: Not on file  Intimate Partner Violence: Not At Risk (09/14/2018)   Humiliation, Afraid, Rape, and Kick questionnaire    Fear of  Current or Ex-Partner: No    Emotionally Abused: No    Physically Abused: No    Sexually Abused: No    Outpatient Medications Prior to Visit  Medication Sig Dispense Refill   aspirin 81 MG tablet Take 81 mg by mouth daily.     glipiZIDE (GLUCOTROL XL) 2.5 MG 24 hr tablet Take 2.5 mg by mouth 2 (two) times daily.     glucose blood (ONETOUCH VERIO) test strip USE TO TEST BLOOD SUGAR 3 TIMES DAILY 100 each 0   JANUVIA 100 MG tablet Take 100 mg by mouth daily.     Potassium 99 MG TABS Take by mouth.     colchicine 0.6 MG tablet Take a half tablet (0.3 mg total) by mouth daily. (Patient not taking: Reported on 01/13/2023) 15 tablet 1   diltiazem (TIAZAC) 300 MG 24 hr capsule TAKE 1 CAPSULE BY MOUTH EVERY DAY (Patient not taking: Reported on 01/20/2023) 30 capsule 5   No facility-administered medications prior to visit.    Allergies  Allergen Reactions   Colchicine Nausea Only   Hydrocodone Other (See Comments)   Sulindac Other (See Comments)   Tramadol Other (See Comments)    "Made me feel like I was in another world."   Celecoxib Rash   Glimepiride Rash    ROS     Objective:    Physical Exam Constitutional:      General: She is not in acute distress.    Appearance: She is not ill-appearing.  Cardiovascular:     Rate and Rhythm: Normal rate.  Pulmonary:     Effort: Pulmonary effort is normal.  Neurological:     Mental Status: She is alert. Mental status is at baseline.  Psychiatric:        Mood and Affect: Mood normal.        Behavior: Behavior normal.     BP (!) 140/82 (BP Location: Left Arm, Patient Position: Sitting, Cuff Size: Large) Comment: no BP meds for 2 days  Pulse 92   Temp 97.8 F (36.6 C) (Temporal)   Ht '5\' 2"'$  (1.575 m)   Wt 146 lb (66.2 kg)   SpO2 99%   BMI 26.70 kg/m  Wt Readings from Last 3 Encounters:  01/20/23 146 lb (66.2 kg)  01/13/23 146 lb (66.2 kg)  07/08/22 140 lb 9.6 oz (63.8 kg)       Assessment & Plan:  Problem List Items  Addressed This Visit       Cardiovascular and Mediastinum   Essential hypertension    Mildly elevated. Recommend cutting back on sodium.       Relevant Medications   Continuous Blood Gluc Sensor (DEXCOM G7 SENSOR) MISC   Continuous Blood Gluc Receiver (DEXCOM G7 RECEIVER) DEVI   pravastatin (PRAVACHOL) 10 MG tablet     Endocrine   Hyperlipidemia associated with type 2 diabetes mellitus ([place])    Start on pravastatin. Cut back on foods high in fats and processed foods.       Relevant Medications   pravastatin (PRAVACHOL) 10 MG tablet   Uncontrolled diabetes mellitus with hyperglycemia, without long-term current use of insulin (HCC) - Primary    A1c not at goal. She prefers to work on diet and cut back on sugar and sweets than increasing or adding more medication. Encouraged her to check BS at home. Glucose monitoring system ordered. Follow up in 3 months.       Relevant Medications   Continuous Blood Gluc Sensor (DEXCOM G7 SENSOR) MISC   Continuous Blood Gluc Receiver (DEXCOM G7 RECEIVER) DEVI   pravastatin (PRAVACHOL) 10 MG tablet     Other   B12 deficiency    B12 injections weekly x 4 wks. First injection today.  Start OTC MVI      Leukocytes in urine    UA with small leuks. Negative nit, rbc, pro, glucose. Asymptomatic.       Relevant Orders   POCT Urinalysis Dipstick (Completed)   Vitamin D deficiency    Start prescription vitamin D x 12 weeks.       Relevant Medications   Vitamin D, Ergocalciferol, (DRISDOL) 1.25 MG (50000 UNIT) CAPS capsule    I am having Jacelynn M. Magill start on Dexcom G7 Sensor, Dexcom G7 Receiver, Vitamin D (Ergocalciferol), and pravastatin. I am also having her maintain her aspirin, glucose blood, diltiazem, colchicine, Januvia, glipiZIDE, and Potassium. We administered cyanocobalamin.  Meds ordered this encounter  Medications   cyanocobalamin (VITAMIN B12) injection 1,000 mcg   Continuous Blood Gluc Sensor (DEXCOM G7 SENSOR) MISC     Sig: Use to check blood sugars up to 3 times daily. Replace sensor every 10 days    Dispense:  3 each    Refill:  4   Continuous Blood Gluc Receiver ([place]) DEVI    Sig: Use to check Blood sugars up to 3 times daily    Dispense:  1 each    Refill:  0   Vitamin D, Ergocalciferol, (DRISDOL) 1.25 MG (50000 UNIT) CAPS capsule    Sig: Take 1 capsule (50,000 Units total) by mouth every 7 (seven) days.    Dispense:  4 capsule    Refill:  2    Order Specific Question:   Supervising Provider    Answer:   Pricilla Holm A [[id number]]   pravastatin (PRAVACHOL) 10 MG tablet    Sig: Take 1 tablet (10 mg total) by mouth daily.    Dispense:  90 tablet    Refill:  1    Order Specific Question:   Supervising Provider    Answer:   Pricilla Holm A [id number]

## 2023-01-20 NOTE — Assessment & Plan Note (Signed)
A1c not at goal. She prefers to work on diet and cut back on sugar and sweets than increasing or adding more medication. Encouraged her to check BS at home. Glucose monitoring system ordered. Follow up in 3 months.

## 2023-01-27 ENCOUNTER — Ambulatory Visit (INDEPENDENT_AMBULATORY_CARE_PROVIDER_SITE_OTHER): Payer: Medicare Other | Admitting: Radiology

## 2023-01-27 DIAGNOSIS — E538 Deficiency of other specified B group vitamins: Secondary | ICD-10-CM

## 2023-01-27 MED ORDER — CYANOCOBALAMIN 1000 MCG/ML IJ SOLN
1000.0000 ug | Freq: Once | INTRAMUSCULAR | Status: AC
  Administered 2023-01-27: 1000 ug via INTRAMUSCULAR

## 2023-01-27 NOTE — Progress Notes (Signed)
Gave pt B12 injection and tolerated well.

## 2023-02-01 ENCOUNTER — Ambulatory Visit
Admission: RE | Admit: 2023-02-01 | Discharge: 2023-02-01 | Disposition: A | Discharge status: Home / Self Care | Payer: Medicare Other | Source: Ambulatory Visit | Attending: Family Medicine | Admitting: Family Medicine

## 2023-02-01 DIAGNOSIS — R4189 Other symptoms and signs involving cognitive functions and awareness: Secondary | ICD-10-CM

## 2023-02-01 MED ORDER — GADOPICLENOL 0.5 MMOL/ML IV SOLN
7.0000 mL | Freq: Once | INTRAVENOUS | Status: AC | PRN
  Administered 2023-02-01: 7 mL via INTRAVENOUS

## 2023-02-01 NOTE — Progress Notes (Signed)
Her brain MRI shows evidence of chronic small vessel changes and mild atrophy. The neurologist will be able to review this with her at her upcoming visit. Nothing acute showed up on this study.

## 2023-02-03 ENCOUNTER — Ambulatory Visit: Payer: Medicare Other

## 2023-02-15 ENCOUNTER — Ambulatory Visit: Payer: Medicare Other

## 2023-02-16 ENCOUNTER — Encounter: Payer: Self-pay | Admitting: Neurology

## 2023-02-16 ENCOUNTER — Ambulatory Visit (INDEPENDENT_AMBULATORY_CARE_PROVIDER_SITE_OTHER): Payer: Medicare Other | Admitting: Neurology

## 2023-02-16 VITALS — BP 126/70 | HR 80 | Ht 64.0 in | Wt 145.0 lb

## 2023-02-16 DIAGNOSIS — G3184 Mild cognitive impairment, so stated: Secondary | ICD-10-CM | POA: Diagnosis not present

## 2023-02-16 MED ORDER — DONEPEZIL HCL 5 MG PO TABS: 5.0000 mg | ORAL_TABLET | Freq: Every day | ORAL | 3 refills | Status: DC

## 2023-02-16 NOTE — Progress Notes (Signed)
[facility]  PATIENT: [name] [name] DOB: August 09, 1946  REQUESTING CLINICIAN: Avanell Shackleton, NP-C HISTORY FROM: Patient and daughters  REASON FOR VISIT: Memory loss    HISTORICAL  CHIEF COMPLAINT:  Chief Complaint  Patient presents with   Memory Loss    RM 77 with daughter [name] [name] Pt is well, daughter reports she is having more short term memory issues. She forgets where she places things easily or forgets what she was recently told. This has been a concern for about 3 months     HISTORY OF PRESENT ILLNESS:  This is a 77 year old woman with past medical history of hypertension, hyperlipidemia, diabetes mellitus, who is presenting with memory problem. Memory loss started for about 2 years and getting worse.  Patient knows that her memory is not what it used to be.  She reports forgetting about recent appointment, getting lost while driving.  She is accompanied by her daughter who states that she is forgetful and does repeat herself all.  Patient also reports that she is under a lot of stress because she is taking care of her sick husband. She does not do much, other that seat and watch TV. She does exercise and does not socialized outside the house. Daughters go to the house to help with her medicine. Daughters also handle her bill payments.    TBI:  No past history of TBI Stroke:   no past history of stroke Seizures:   no past history of seizures Sleep:  no history of sleep apnea.   Mood:  patient denies anxiety and depression Family history of Dementia: Denies  Functional status: independent in all ADLs and IADLs Patient lives with her husband who needs lot of medical attention Cooking: patient Cleaning: yes Shopping: yes Bathing: yes Toileting: yes Driving: Yes  Medicine: Daughter helps her with her pill box  Bill: Daughter helps pay the bills.  Ever left the stove on by accident?: yes  Forget how to use items around the house?: Denies  Getting  lost going to familiar places?: Yes  Forgetting loved ones names?: Denies  Word finding difficulty? Denies  Sleep: not good    OTHER MEDICAL CONDITIONS: Diabetes, Hypertension, Hyperlipidemia    REVIEW OF SYSTEMS: Full 14 system review of systems performed and negative with exception of: As noted in the HPI   ALLERGIES: Allergies  Allergen Reactions   Colchicine Nausea Only   Hydrocodone Other (See Comments)   Sulindac Other (See Comments)   Tramadol Other (See Comments)    "Made me feel like I was in another world."   Celecoxib Rash   Glimepiride Rash    HOME MEDICATIONS: Outpatient Medications Prior to Visit  Medication Sig Dispense Refill   aspirin 81 MG tablet Take 81 mg by mouth daily.     colchicine 0.6 MG tablet Take a half tablet (0.3 mg total) by mouth daily. (Patient taking differently: as needed. Take a half tablet (0.3 mg total) by mouth daily.) 15 tablet 1   Continuous Blood Gluc Receiver (DEXCOM G7 RECEIVER) DEVI Use to check Blood sugars up to 3 times daily 1 each 0   Continuous Blood Gluc Sensor (DEXCOM G7 SENSOR) MISC Use to check blood sugars up to 3 times daily. Replace sensor every 10 days 3 each 4   diltiazem (TIAZAC) 300 MG 24 hr capsule TAKE 1 CAPSULE BY MOUTH EVERY DAY 30 capsule 5   glipiZIDE (GLUCOTROL XL) 2.5 MG 24 hr tablet Take 2.5 mg by mouth 2 (two)  times daily.     glucose blood (ONETOUCH VERIO) test strip USE TO TEST BLOOD SUGAR 3 TIMES DAILY 100 each 0   JANUVIA 100 MG tablet Take 100 mg by mouth daily.     Potassium 99 MG TABS Take by mouth.     Vitamin D, Ergocalciferol, (DRISDOL) 1.25 MG (50000 UNIT) CAPS capsule Take 1 capsule (50,000 Units total) by mouth every 7 (seven) days. 4 capsule 2   pravastatin (PRAVACHOL) 10 MG tablet Take 1 tablet (10 mg total) by mouth daily. (Patient not taking: Reported on 02/16/2023) 90 tablet 1   No facility-administered medications prior to visit.    PAST MEDICAL HISTORY: Past Medical History:  Diagnosis  Date   Bilateral knee pain    Euflexxa x3   Chest pain 06/2007   CP and QW on EKG: neg stress test    Diabetes mellitus    w/ neuropathy   Diverticulitis 2000   Gout    Hyperlipidemia    Hypertension    Insomnia 02/20/2013   Osteoarthritis    gout, CPPD--- sees Dr.Davenshwar   Osteoarthritis of both feet 09/14/2016   Osteoarthritis of both hands 09/14/2016   Osteoarthritis of both knees 09/14/2016   Osteopenia    per DEXA 4/10   Recurrent boils    h/o MRSA   Type 2 diabetes mellitus without complication 12/17/2020    PAST SURGICAL HISTORY: Past Surgical History:  Procedure Laterality Date   CHOLECYSTECTOMY     TONSILLECTOMY      FAMILY HISTORY: Family History  Problem Relation Age of Onset   Hypertension Mother    Diabetes Mother    Hypertension Father    Diabetes Father    Hypertension Daughter    Hypertension Sister    Diabetes Sister    Coronary artery disease Neg Hx    Stroke Neg Hx    Colon cancer Neg Hx    Breast cancer Neg Hx     SOCIAL HISTORY: Social History   Socioeconomic History   Marital status: Married    Spouse name: herbert   Number of children: 3   Years of education: college   Highest education level: Not on file  Occupational History   Occupation: Retired- 2009, doing part time work sometimes     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   Patient is right handed.   Patient has college education.   Patient drinks 3-4 daily.   Social Determinants of Health   Financial Resource Strain: Low Risk  (09/14/2018)   Overall Financial Resource Strain (CARDIA)     Difficulty of Paying Living Expenses: Not hard at all  Food Insecurity: No Food Insecurity (09/14/2018)   Hunger Vital Sign    Worried About Running Out of Food in the Last Year: Never true    Ran Out of Food in the Last Year: Never true  Transportation Needs: No Transportation Needs (09/14/2018)   PRAPARE - Administrator, Civil ServiceTransportation    Lack of Transportation (Medical): No    Lack of Transportation (  Non-Medical): No  Physical Activity: Inactive (09/14/2018)   Exercise Vital Sign    Days of Exercise per Week: 0 days    Minutes of Exercise per Session: 0 min  Stress: No Stress Concern Present (09/14/2018)   [employer] of Occupational Health - Occupational Stress Questionnaire    Feeling of Stress : Not at all  Social Connections: Not on file  Intimate Partner Violence: Not At Risk (09/14/2018)   Humiliation, Afraid, Rape, and Kick questionnaire    Fear of Current or Ex-Partner: No    Emotionally Abused: No    Physically Abused: No    Sexually Abused: No    PHYSICAL EXAM  GENERAL EXAM/CONSTITUTIONAL: Vitals:  Vitals:   02/16/23 1506  BP: 126/70  Pulse: 80  Weight: 145 lb (65.8 kg)  Height: 5\' 4"  (1.626 m)   Body mass index is 24.89 kg/m. Wt Readings from Last 3 Encounters:  02/16/23 145 lb (65.8 kg)  01/20/23 146 lb (66.2 kg)  01/13/23 146 lb (66.2 kg)   Patient is in no distress; well developed, nourished and groomed; neck is supple   EYES: Visual fields full to confrontation, Extraocular movements intacts,   MUSCULOSKELETAL: Gait, strength, tone, movements noted in Neurologic exam below  NEUROLOGIC: MENTAL STATUS:     06/15/2016    1:21 PM  MMSE - Mini Mental State Exam  Orientation to time 5  Orientation to Place 5  Registration 3  Attention/ Calculation 5  Recall 1  Language- name 2 objects 2  Language- repeat 1  Language- follow 3 step command 3  Language- read & follow direction 1  Write a sentence 1  Copy design 1  Total score 28      02/16/2023     3:15 PM  Montreal Cognitive Assessment   Visuospatial/ Executive (0/5) 3  Naming (0/3) 2  Attention: Read list of digits (0/2) 2  Attention: Read list of letters (0/1) 1  Attention: Serial 7 subtraction starting at 100 (0/3) 2  Language: Repeat phrase (0/2) 2  Language : Fluency (0/1) 1  Abstraction (0/2) 2  Delayed Recall (0/5) 0  Orientation (0/6) 2  Total 17    CRANIAL NERVE:  2nd, 3rd, 4th, 6th- visual fields full to confrontation, extraocular muscles intact, no nystagmus 5th - facial sensation symmetric 7th - facial strength symmetric 8th - hearing intact 9th - palate elevates symmetrically, uvula midline 11th - shoulder shrug symmetric 12th - tongue protrusion midline  MOTOR:  normal bulk and tone, full strength in the BUE, BLE  SENSORY:  normal and symmetric to light touch  COORDINATION:  finger-nose-finger, fine finger movements normal  GAIT/STATION:  normal   DIAGNOSTIC DATA (LABS, IMAGING, TESTING) - I reviewed patient records, labs, notes, testing and imaging myself where available.  Lab Results  Component Value Date   WBC 6.8 01/13/2023   HGB 12.3 01/13/2023   HCT 35.9 (L) 01/13/2023   MCV 86.8 01/13/2023   PLT 276.0 01/13/2023      Component Value Date/Time   NA 145 01/13/2023 1013   NA 139 11/16/2018 1131   K 3.7 01/13/2023 1013   CL 103 01/13/2023 1013   CO2 30 01/13/2023 1013   GLUCOSE 157 (H) 01/13/2023 1013   GLUCOSE 129 07/28/2010 0000   BUN 5 (L) 01/13/2023 1013   BUN 9 11/16/2018 1131   CREATININE 0.78 01/13/2023 1013   CREATININE 1.73 (H) 07/08/2022 1501   CALCIUM 10.0 01/13/2023 1013   PROT 7.5 01/13/2023 1013   PROT  6.8 11/16/2018 1131   ALBUMIN 4.0 01/13/2023 1013   ALBUMIN 4.2 11/16/2018 1131   AST 9 01/13/2023 1013   ALT 7 01/13/2023 1013   ALKPHOS 96 01/13/2023 1013   BILITOT 0.4 01/13/2023 1013   BILITOT 0.3 11/16/2018 1131   GFRNONAA 84 10/14/2020 1201   GFRAA 98 10/14/2020 1201   Lab Results  Component Value  Date   CHOL 202 (H) 01/13/2023   HDL 76.30 01/13/2023   LDLCALC 111 (H) 01/13/2023   LDLDIRECT 111.1 02/20/2013   TRIG 75.0 01/13/2023   CHOLHDL 3 01/13/2023   Lab Results  Component Value Date   HGBA1C 8.9 (H) 01/13/2023   Lab Results  Component Value Date   VITAMINB12 720 02/16/2023   Lab Results  Component Value Date   TSH 1.350 02/16/2023    MRI Brain 02/01/2023 1. No evidence of an acute intracranial abnormality. 2. Moderate cerebral white matter chronic small vessel ischemic disease. 3. Chronic small-vessel ischemic changes also present within the bilateral basal ganglia. 4. Mild generalized parenchymal atrophy. 5. Mild mucosal thickening within the bilateral ethmoid and left maxillary sinuse    ASSESSMENT AND PLAN  77 y.o. year old female with hypertension, hyperlipidemia, diabetes mellitus who is presenting with memory complaint for the past 2 years getting worse.  Memory deficit described as being forgetful, getting lost while driving in familiar places and sometimes burning the pots when cooking.  Today on exam she scored a 17 out of 30 on the MoCA indicative of impairment.  Patient likely has mild cognitive impairment, I will start her on Aricept 5 mg nightly, will also obtain dementia labs including B12, TSH and ATN profile to look for Alzheimer biomarker.  Her recent MRI brain showed no evidence of acute intracranial abnormality but there is moderate microangiopathy and generalized brain atrophy.  I will also send her for formal neuropsychological testing.  I will contact the patient to go over the results otherwise I will see her in 1 year for follow-up. In terms of her mood and the children reporting their mother being lonely and ?depressed, I offered antidepressant but they are deferring at this time.   1. Mild cognitive impairment      Patient Instructions  Dementia lab including TSH, B12 and ATN  Referral to Neuropsych  Start Donepezil  Return in a year     There are well-accepted and sensible ways to reduce risk for Alzheimers disease and other degenerative brain disorders .  Exercise Daily Walk A daily 20 minute walk should be part of your routine. Disease related apathy can be a significant roadblock to exercise and the only way to overcome this is to make it a daily routine and perhaps have a reward at the end (something your loved one loves to eat or drink perhaps) or a personal trainer coming to the home can also be very useful. Most importantly, the patient is much more likely to exercise if the caregiver / spouse does it with him/her. In general a structured, repetitive schedule is best.  General Health: Any diseases which effect your body will effect your brain such as a pneumonia, urinary infection, blood clot, heart attack or stroke. Keep contact with your primary care doctor for regular follow ups.  Sleep. A good nights sleep is healthy for the brain. Seven hours is recommended. If you have insomnia or poor sleep habits we can give you some instructions. If you have sleep apnea wear your mask.  Diet: Eating a heart healthy diet  is also a good idea; fish and poultry instead of red meat, nuts (mostly non-peanuts), vegetables, fruits, olive oil or canola oil (instead of butter), minimal salt (use other spices to flavor foods), whole grain rice, bread, cereal and pasta and wine in moderation.Research is now showing that the MIND diet, which is a combination of The Mediterranean diet and the DASH diet, is beneficial for cognitive processing and longevity. Information about this diet can be found in The MIND Diet, a book by Alonna Minium, MS, RDN, and online at [employer].es  Finances, Power of [address] and Advance Directives: You should consider putting legal safeguards in place with regard to financial and medical decision making. While the spouse always has power of attorney for medical and financial issues in the  absence of any form, you should consider what you want in case the spouse / caregiver is no longer around or capable of making decisions.   Orders Placed This Encounter  Procedures   TSH   Vitamin B12   ATN PROFILE   Ambulatory referral to Neuropsychology    Meds ordered this encounter  Medications   donepezil (ARICEPT) 5 MG tablet    Sig: Take 1 tablet (5 mg total) by mouth at bedtime.    Dispense:  30 tablet    Refill:  3    Return in about 1 year (around 02/16/2024).    Windell Norfolk, MD 02/18/2023, 3:37 PM  [facility] [address] [place], [place] [address] [phone]

## 2023-02-16 NOTE — Patient Instructions (Signed)
Dementia lab including TSH, B12 and ATN  Referral to Neuropsych  Start Donepezil  Return in a year    There are well-accepted and sensible ways to reduce risk for Alzheimers disease and other degenerative brain disorders .  Exercise Daily Walk A daily 20 minute walk should be part of your routine. Disease related apathy can be a significant roadblock to exercise and the only way to overcome this is to make it a daily routine and perhaps have a reward at the end (something your loved one loves to eat or drink perhaps) or a personal trainer coming to the home can also be very useful. Most importantly, the patient is much more likely to exercise if the caregiver / spouse does it with him/her. In general a structured, repetitive schedule is best.  General Health: Any diseases which effect your body will effect your brain such as a pneumonia, urinary infection, blood clot, heart attack or stroke. Keep contact with your primary care doctor for regular follow ups.  Sleep. A good nights sleep is healthy for the brain. Seven hours is recommended. If you have insomnia or poor sleep habits we can give you some instructions. If you have sleep apnea wear your mask.  Diet: Eating a heart healthy diet is also a good idea; fish and poultry instead of red meat, nuts (mostly non-peanuts), vegetables, fruits, olive oil or canola oil (instead of butter), minimal salt (use other spices to flavor foods), whole grain rice, bread, cereal and pasta and wine in moderation.Research is now showing that the MIND diet, which is a combination of The Mediterranean diet and the DASH diet, is beneficial for cognitive processing and longevity. Information about this diet can be found in The MIND Diet, a book by Doyne Keel, MS, RDN, and online at [employer]si  Finances, Power of Attorney and Advance Directives: You should consider putting legal safeguards in place with regard to financial and medical  decision making. While the spouse always has power of attorney for medical and financial issues in the absence of any form, you should consider what you want in case the spouse / caregiver is no longer around or capable of making decisions.

## 2023-02-17 ENCOUNTER — Telehealth: Payer: Self-pay | Admitting: Family Medicine

## 2023-02-17 ENCOUNTER — Encounter: Payer: Self-pay | Admitting: Psychology

## 2023-02-17 DIAGNOSIS — E559 Vitamin D deficiency, unspecified: Secondary | ICD-10-CM

## 2023-02-17 MED ORDER — VITAMIN D (ERGOCALCIFEROL) 1.25 MG (50000 UNIT) PO CAPS
50000.0000 [IU] | ORAL_CAPSULE | ORAL | 1 refills | Status: DC
Start: 2023-02-17 — End: 2023-06-01

## 2023-02-17 NOTE — Telephone Encounter (Signed)
Patient's daughter call and said that patient misplaced her vitamin D pills.  Can some more be called in to [facility] on [address]

## 2023-02-17 NOTE — Telephone Encounter (Signed)
Rx sent 

## 2023-02-18 ENCOUNTER — Ambulatory Visit (INDEPENDENT_AMBULATORY_CARE_PROVIDER_SITE_OTHER): Payer: Medicare Other | Admitting: Radiology

## 2023-02-18 DIAGNOSIS — E538 Deficiency of other specified B group vitamins: Secondary | ICD-10-CM

## 2023-02-18 MED ORDER — CYANOCOBALAMIN 1000 MCG/ML IJ SOLN
1000.0000 ug | Freq: Once | INTRAMUSCULAR | Status: AC
Start: 2023-02-18 — End: 2023-02-18
  Administered 2023-02-18: 1000 ug via INTRAMUSCULAR

## 2023-02-18 NOTE — Progress Notes (Signed)
Pt here for b12 injection and tolerated well

## 2023-02-19 LAB — ATN PROFILE
A -- Beta-amyloid 42/40 Ratio: 0.129 (ref >0.102)
Beta-amyloid 40: 196.34 pg/mL
Beta-amyloid 42: 25.42 pg/mL
N -- NfL, Plasma: 2.78 pg/mL (ref 0.00–7.64)
T -- p-tau181: 0.57 pg/mL (ref 0.00–0.97)

## 2023-02-19 LAB — VITAMIN B12: Vitamin B-12: 720 pg/mL (ref 232–1245)

## 2023-02-19 LAB — TSH: TSH: 1.35 u[IU]/mL (ref 0.450–4.500)

## 2023-02-19 NOTE — Progress Notes (Signed)
Please call and advise the patient that the recent dementia labs we checked were within normal limits, no evidence of Alzheimer disease biomarker's.  No further action is required on these tests at this time. Please remind patient to keep any upcoming appointments or tests and to call us with any interim questions, concerns, problems or updates. Thanks,   Windell Norfolk, MD

## 2023-02-24 ENCOUNTER — Other Ambulatory Visit: Payer: Self-pay

## 2023-02-24 MED ORDER — JANUVIA 100 MG PO TABS: 100.0000 mg | ORAL_TABLET | Freq: Every day | ORAL | 0 refills | Status: DC

## 2023-02-28 ENCOUNTER — Ambulatory Visit (INDEPENDENT_AMBULATORY_CARE_PROVIDER_SITE_OTHER): Payer: Medicare Other

## 2023-02-28 DIAGNOSIS — E538 Deficiency of other specified B group vitamins: Secondary | ICD-10-CM

## 2023-02-28 MED ORDER — CYANOCOBALAMIN 1000 MCG/ML IJ SOLN
1000.0000 ug | Freq: Once | INTRAMUSCULAR | Status: AC
Start: 2023-02-28 — End: 2023-02-28
  Administered 2023-02-28: 1000 ug via INTRAMUSCULAR

## 2023-02-28 NOTE — Progress Notes (Signed)
After obtaining consent, and per orders of Dr. John, injection of B12 given by Dorothee Napierkowski P Ledell Codrington. Patient instructed to report any adverse reaction to me immediately.  

## 2023-03-09 ENCOUNTER — Telehealth: Payer: Self-pay

## 2023-03-09 NOTE — Telephone Encounter (Signed)
Contacted [name] [name] to schedule their annual wellness visit. Appointment made for 03/14/23.

## 2023-03-14 ENCOUNTER — Ambulatory Visit (INDEPENDENT_AMBULATORY_CARE_PROVIDER_SITE_OTHER): Payer: Medicare Other

## 2023-03-14 VITALS — Ht 64.0 in | Wt 145.0 lb

## 2023-03-14 DIAGNOSIS — Z0000 Encounter for general adult medical examination without abnormal findings: Secondary | ICD-10-CM | POA: Diagnosis not present

## 2023-03-14 NOTE — Progress Notes (Cosign Needed Addendum)
I connected with  [name] [name] and daughter, [name] [name] on 03/14/23 by a audio enabled telemedicine application and verified that I am speaking with the correct person using two identifiers.  Patient Location: Home  Provider Location: Office/Clinic  I discussed the limitations of evaluation and management by telemedicine. The patient expressed understanding and agreed to proceed.  Subjective:   [name] [name] is a 77 y.o. female who presents for Medicare Annual (Subsequent) preventive examination.  Review of Systems     Cardiac Risk Factors include: advanced age (>91men, >23 women);diabetes mellitus;dyslipidemia;family history of premature cardiovascular disease;hypertension;sedentary lifestyle     Objective:    Today's Vitals   03/14/23 1627  Weight: 145 lb (65.8 kg)  Height: 5\' 4"  (1.626 m)  PainSc: 0-No pain   Body mass index is 24.89 kg/m.     03/14/2023    4:30 PM 09/14/2018   10:34 AM 07/22/2017    1:35 PM 06/15/2016    1:20 PM  Advanced Directives  Does Patient Have a Medical Advance Directive? Yes Yes No No  Type of Estate agent of [place];Living will Living will    Does patient want to make changes to medical advance directive?  No - Patient declined    Copy of Healthcare Power of Attorney in Chart? No - copy requested     Would patient like information on creating a medical advance directive?   Yes (MAU/Ambulatory/Procedural Areas - Information given) Yes - Educational materials given    Current Medications (verified) Outpatient Encounter Medications as of 03/14/2023  Medication Sig   aspirin 81 MG tablet Take 81 mg by mouth daily.   colchicine 0.6 MG tablet Take a half tablet (0.3 mg total) by mouth daily. (Patient taking differently: as needed. Take a half tablet (0.3 mg total) by mouth daily.)   Continuous Blood Gluc Receiver (DEXCOM G7 RECEIVER) DEVI Use to check Blood sugars up to 3 times daily   Continuous Blood Gluc Sensor (DEXCOM  G7 SENSOR) MISC Use to check blood sugars up to 3 times daily. Replace sensor every 10 days   diltiazem (TIAZAC) 300 MG 24 hr capsule TAKE 1 CAPSULE BY MOUTH EVERY DAY   donepezil (ARICEPT) 5 MG tablet Take 1 tablet (5 mg total) by mouth at bedtime.   glipiZIDE (GLUCOTROL XL) 2.5 MG 24 hr tablet Take 2.5 mg by mouth 2 (two) times daily.   glucose blood (ONETOUCH VERIO) test strip USE TO TEST BLOOD SUGAR 3 TIMES DAILY   JANUVIA 100 MG tablet Take 1 tablet (100 mg total) by mouth daily. Please schedule follow up for May.   Potassium 99 MG TABS Take by mouth.   Vitamin D, Ergocalciferol, (DRISDOL) 1.25 MG (50000 UNIT) CAPS capsule Take 1 capsule (50,000 Units total) by mouth every 7 (seven) days.   No facility-administered encounter medications on file as of 03/14/2023.    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   Osteoarthritis of both feet 09/14/2016   Osteoarthritis of both hands 09/14/2016   Osteoarthritis of both knees 09/14/2016  Osteopenia    per DEXA 4/10   Recurrent boils    h/o MRSA   Type 2 diabetes mellitus without complication (HCC) 12/17/2020   Past Surgical History:  Procedure Laterality Date   CHOLECYSTECTOMY     TONSILLECTOMY     Family History  Problem Relation Age of Onset   Hypertension Mother    Diabetes Mother    Hypertension Father    Diabetes Father    Hypertension Daughter    Hypertension Sister    Diabetes Sister    Coronary artery disease Neg Hx    Stroke Neg Hx    Colon cancer Neg Hx    Breast cancer Neg Hx    Social History   Socioeconomic History   Marital status:  Married    Spouse name: herbert   Number of children: 3   Years of education: college   Highest education level: Not on file  Occupational History   Occupation: Retired- 2009, doing part time work sometimes     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   Patient is right handed.   Patient has college education.   Patient drinks 3-4 daily.   Social Determinants of Health   Financial Resource Strain: Low Risk  (03/14/2023)   Overall Financial Resource Strain (CARDIA)    Difficulty of Paying Living Expenses: Not hard at all  Food Insecurity: No Food Insecurity (03/14/2023)   Hunger Vital Sign    Worried About Running Out of Food in the Last Year: Never true    Ran Out of Food in the Last Year: Never true  Transportation Needs: No Transportation Needs (03/14/2023)   PRAPARE - Administrator, Civil Service (Medical): No    Lack of Transportation (Non-Medical): No  Physical Activity: Inactive (03/14/2023)   Exercise Vital Sign    Days of Exercise per Week: 0 days    Minutes of Exercise per Session: 0 min  Stress: No Stress Concern Present (03/14/2023)   [employer] of Occupational Health - Occupational Stress Questionnaire    Feeling of Stress : Not at all  Social Connections: Socially Integrated (03/14/2023)   Social Connection and Isolation Panel [NHANES]    Frequency of Communication with Friends and Family: More than three times a week    Frequency of Social Gatherings with Friends and Family: More than three times a week    Attends Religious Services: More than 4 times per year    Active  Member of [employer] or Organizations: Yes    Attends Engineer, structural: More than 4 times per year    Marital Status: Married    Tobacco Counseling Counseling given: Not Answered   Clinical Intake:  Pre-visit preparation completed: Yes  Pain : No/denies pain Pain Score: 0-No pain     BMI - recorded: 24.89 Nutritional Status: BMI of 19-24  Normal Nutritional Risks: None Diabetes: No  How often do you need to have someone help you when you read instructions, pamphlets, or other written materials from  your doctor or pharmacy?: 1 - Never What is the last grade level you completed in school?: College Graduate  Nutrition Risk Assessment:  Has the patient had any N/V/D within the last 2 months?  No  Does the patient have any non-healing wounds?  No  Has the patient had any unintentional weight loss or weight gain?  No   Diabetes:  Is the patient diabetic?  Yes  If diabetic, was a CBG obtained today?  No  Did the patient bring in their glucometer from home?  No  How often do you monitor your CBG's? 3 times per day.   Financial Strains and Diabetes Management:  Are you having any financial strains with the device, your supplies or your medication? No .  Does the patient want to be seen by Chronic Care Management for management of their diabetes?  No  Would the patient like to be referred to a Nutritionist or for Diabetic Management?  No   Diabetic Exams:  Diabetic Eye Exam: Overdue for diabetic eye exam. Pt has been advised about the importance in completing this exam. Patient advised to call and schedule an eye exam. Diabetic Foot Exam: Overdue, Pt has been advised about the importance in completing this exam. Pt is scheduled for diabetic foot exam on next appointment with PCP.   Interpreter Needed?: No  Information entered by :: Susie Cassette, LPN.   Activities of Daily Living    03/14/2023    4:31 PM  In your present state of health, do you have any  difficulty performing the following activities:  Hearing? 0  Vision? 0  Difficulty concentrating or making decisions? 0  Walking or climbing stairs? 0  Dressing or bathing? 0  Doing errands, shopping? 0  Preparing Food and eating ? N  Using the Toilet? N  In the past six months, have you accidently leaked urine? Y  Do you have problems with loss of bowel control? N  Managing your Medications? N  Managing your Finances? N  Housekeeping or managing your Housekeeping? N    Patient Care Team: Avanell Shackleton, NP-C as PCP - General (Family Medicine) Micki Riley, MD as Consulting Physician (Neurology) Tawni Pummel, [place] as Physician Assistant (Orthopedic Surgery) Pollyann Savoy, MD as Consulting Physician (Rheumatology) Jethro Bolus, MD as Consulting Physician (Ophthalmology) Sallye Lat, MD as Consulting Physician (Ophthalmology)  Indicate any recent Medical Services you may have received from other than [facility] providers in the past year (date may be approximate).     Assessment:   This is a routine wellness examination for [place].  Hearing/Vision screen Hearing Screening - Comments:: Denies hearing difficulties.  Vision Screening - Comments:: Wears rx glasses - up to date with routine eye exams with [facility].   Dietary issues and exercise activities discussed: Current Exercise Habits: The patient does not participate in regular exercise at present, Exercise limited by: orthopedic condition(s)   Goals Addressed             This Visit's Progress    Client will verbalize knowledge of diabetes self-management as evidenced by Hgb A1C <7 or as defined by provider.            Depression Screen    03/14/2023    4:29 PM 01/13/2023    9:14 AM 11/16/2018   10:14 AM 10/17/2018   12:14 PM 09/14/2018   10:34 AM 02/03/2018   11:46 AM 10/13/2017   10:22 AM  PHQ 2/9 Scores  PHQ - 2 Score  0 0 0 0 0 0 0    Fall Risk    03/14/2023    4:31 PM 01/13/2023    9:14  AM 11/16/2018   10:14 AM 10/17/2018   12:14 PM 09/14/2018   10:34 AM  Fall Risk   Falls in the past year? 0 0 0 0 No  Number falls in past yr: 0 0     Injury with Fall? 0 0     Risk for fall due to : No Fall Risks No Fall Risks   Medication side effect  Follow up Falls prevention discussed Falls evaluation completed       FALL RISK PREVENTION PERTAINING TO THE HOME:  Any stairs in or around the home? No  If so, are there any without handrails? No  Home free of loose throw rugs in walkways, pet beds, electrical cords, etc? Yes  Adequate lighting in your home to reduce risk of falls? Yes   ASSISTIVE DEVICES UTILIZED TO PREVENT FALLS:  Life alert? No  Use of a cane, walker or w/c? No  Grab bars in the bathroom? Yes  Shower chair or bench in shower? Yes  Elevated toilet seat or a handicapped toilet? Yes   TIMED UP AND GO:  Was the test performed? No . Telephonic Visit  Cognitive Function:    06/15/2016    1:21 PM  MMSE - Mini Mental State Exam  Orientation to time 5  Orientation to Place 5  Registration 3  Attention/ Calculation 5  Recall 1  Language- name 2 objects 2  Language- repeat 1  Language- follow 3 step command 3  Language- read & follow direction 1  Write a sentence 1  Copy design 1  Total score 28      02/16/2023    3:15 PM  Montreal Cognitive Assessment   Visuospatial/ Executive (0/5) 3  Naming (0/3) 2  Attention: Read list of digits (0/2) 2  Attention: Read list of letters (0/1) 1  Attention: Serial 7 subtraction starting at 100 (0/3) 2  Language: Repeat phrase (0/2) 2  Language : Fluency (0/1) 1  Abstraction (0/2) 2  Delayed Recall (0/5) 0  Orientation (0/6) 2  Total 17      01/13/2023    9:14 AM 09/14/2018   10:36 AM  6CIT Screen  What Year? 4 points 0 points  What month? 0 points 0 points  What time? 3 points 3 points  Count back from 20 0 points 0 points  Months in reverse 2 points 2 points  Repeat phrase 4 points 2 points  Total Score  13 points 7 points    Immunizations Immunization History  Administered Date(s) Administered   Influenza Split 08/01/2012   Influenza Whole 10/14/2009   Influenza, High Dose Seasonal PF 09/12/2013, 08/19/2015, 09/14/2018   Influenza,inj,Quad PF,6+ Mos 09/24/2014   Influenza,inj,quad, With Preservative 08/15/2020   Influenza-Unspecified 10/01/2019   PFIZER(Purple Top)SARS-COV-2 Vaccination 12/11/2019, 01/01/2020   Pneumococcal Conjugate-13 01/15/2014   Pneumococcal Polysaccharide-23 03/31/2012   Td 08/14/2010    TDAP status: Due, Education has been provided regarding the importance of this vaccine. Advised may receive this vaccine at local pharmacy or Health Dept. Aware to provide a copy of the vaccination record if obtained from local pharmacy or Health Dept. Verbalized acceptance and understanding.  Flu Vaccine status: Due, Education has been provided regarding the importance of this vaccine. Advised may receive this vaccine at local pharmacy or Health Dept. Aware to provide a copy of the vaccination record  if obtained from local pharmacy or Health Dept. Verbalized acceptance and understanding.  Pneumococcal vaccine status: Due, Education has been provided regarding the importance of this vaccine. Advised may receive this vaccine at local pharmacy or Health Dept. Aware to provide a copy of the vaccination record if obtained from local pharmacy or Health Dept. Verbalized acceptance and understanding.  Covid-19 vaccine status: Completed vaccines  Qualifies for Shingles Vaccine? Yes   Zostavax completed No   Shingrix Completed?: No.    Education has been provided regarding the importance of this vaccine. Patient has been advised to call insurance company to determine out of pocket expense if they have not yet received this vaccine. Advised may also receive vaccine at local pharmacy or Health Dept. Verbalized acceptance and understanding.  Screening Tests Health Maintenance  Topic Date  Due   MAMMOGRAM  10/05/2016   OPHTHALMOLOGY EXAM  11/01/2017   DEXA SCAN  08/06/2018   Pneumonia Vaccine 77+ Years old (3 of 3 - PPSV23 or PCV20) 01/16/2019   COLONOSCOPY (Pts 45-18yrs Insurance coverage will need to be confirmed)  12/15/2020   FOOT EXAM  06/02/2022   INFLUENZA VACCINE  06/16/2023   HEMOGLOBIN A1C  07/14/2023   Diabetic kidney evaluation - eGFR measurement  01/13/2024   Diabetic kidney evaluation - Urine ACR  01/13/2024   Medicare Annual Wellness (AWV)  03/13/2024   Hepatitis C Screening  Completed   HPV VACCINES  Aged Out   DTaP/Tdap/Td  Discontinued   COVID-19 Vaccine  Discontinued   Zoster Vaccines- Shingrix  Discontinued    Health Maintenance  Health Maintenance Due  Topic Date Due   MAMMOGRAM  10/05/2016   OPHTHALMOLOGY EXAM  11/01/2017   DEXA SCAN  08/06/2018   Pneumonia Vaccine 51+ Years old (3 of 3 - PPSV23 or PCV20) 01/16/2019   COLONOSCOPY (Pts 45-10yrs Insurance coverage will need to be confirmed)  12/15/2020   FOOT EXAM  06/02/2022    Colorectal cancer screening: Type of screening: Colonoscopy. Completed 09/14/2016. Repeat every 5 years  Mammogram status: Completed 10/06/2015. Repeat every year  Bone Density status: Completed 08/06/2016. Results reflect: Bone density results: OSTEOPOROSIS. Repeat every 2 years.  Lung Cancer Screening: (Low Dose CT Chest recommended if Age 77-80 years, 30 pack-year currently smoking OR have quit w/in 15years.) does not qualify.   Lung Cancer Screening Referral: No  Additional Screening:  Hepatitis C Screening: does qualify; Completed 01/14/2017  Vision Screening: Recommended annual ophthalmology exams for early detection of glaucoma and other disorders of the eye. Is the patient up to date with their annual eye exam?  No  Who is the provider or what is the name of the office in which the patient attends annual eye exams? [facility] If pt is not established with a provider, would they like to be referred to  a provider to establish care? No .   Dental Screening: Recommended annual dental exams for proper oral hygiene  Community Resource Referral / Chronic Care Management: CRR required this visit?  No   CCM required this visit?  No      Plan:     I have personally reviewed and noted the following in the patient's chart:   Medical and social history Use of alcohol, tobacco or illicit drugs  Current medications and supplements including opioid prescriptions. Patient is not currently taking opioid prescriptions. Functional ability and status Nutritional status Physical activity Advanced directives List of other physicians Hospitalizations, surgeries, and ER visits in previous 12 months Vitals Screenings  to include cognitive, depression, and falls Referrals and appointments  In addition, I have reviewed and discussed with patient certain preventive protocols, quality metrics, and best practice recommendations. A written personalized care plan for preventive services as well as general preventive health recommendations were provided to patient.     Mickeal Needy, LPN   1/61/0960   Nurse Notes:  Normal cognitive status assessed by direct observation by this Nurse Health Advisor. No abnormalities found.

## 2023-03-14 NOTE — Patient Instructions (Signed)
[name] [name] , Thank you for taking time to come for your Medicare Wellness Visit. I appreciate your ongoing commitment to your health goals. Please review the following plan we discussed and let me know if I can assist you in the future.   These are the goals we discussed:  Goals      Client will verbalize knowledge of diabetes self-management as evidenced by Hgb A1C <7 or as defined by provider.            This is a list of the screening recommended for you and due dates:  Health Maintenance  Topic Date Due   Mammogram  10/05/2016   Eye exam for diabetics  11/01/2017   DEXA scan (bone density measurement)  08/06/2018   Pneumonia Vaccine (3 of 3 - PPSV23 or PCV20) 01/16/2019   Colon Cancer Screening  12/15/2020   Complete foot exam   06/02/2022   Flu Shot  06/16/2023   Hemoglobin A1C  07/14/2023   Yearly kidney function blood test for diabetes  01/13/2024   Yearly kidney health urinalysis for diabetes  01/13/2024   Medicare Annual Wellness Visit  03/13/2024   Hepatitis C Screening: USPSTF Recommendation to screen - Ages 18-79 yo.  Completed   HPV Vaccine  Aged Out   DTaP/Tdap/Td vaccine  Discontinued   COVID-19 Vaccine  Discontinued   Zoster (Shingles) Vaccine  Discontinued    Advanced directives: Yes  Conditions/risks identified: Yes; Type II Diabetes  Next appointment: Follow up in one year for your annual wellness visit.   Preventive Care 76 Years and Older, Female Preventive care refers to lifestyle choices and visits with your health care provider that can promote health and wellness. What does preventive care include? A yearly physical exam. This is also called an annual well check. Dental exams once or twice a year. Routine eye exams. Ask your health care provider how often you should have your eyes checked. Personal lifestyle choices, including: Daily care of your teeth and gums. Regular physical activity. Eating a healthy diet. Avoiding tobacco and drug  use. Limiting alcohol use. Practicing safe sex. Taking low-dose aspirin every day. Taking vitamin and mineral supplements as recommended by your health care provider. What happens during an annual well check? The services and screenings done by your health care provider during your annual well check will depend on your age, overall health, lifestyle risk factors, and family history of disease. Counseling  Your health care provider may ask you questions about your: Alcohol use. Tobacco use. Drug use. Emotional well-being. Home and relationship well-being. Sexual activity. Eating habits. History of falls. Memory and ability to understand (cognition). Work and work Astronomer. Reproductive health. Screening  You may have the following tests or measurements: Height, weight, and BMI. Blood pressure. Lipid and cholesterol levels. These may be checked every 5 years, or more frequently if you are over 50 years old. Skin check. Lung cancer screening. You may have this screening every year starting at age 58 if you have a 30-pack-year history of smoking and currently smoke or have quit within the past 15 years. Fecal occult blood test (FOBT) of the stool. You may have this test every year starting at age 55. Flexible sigmoidoscopy or colonoscopy. You may have a sigmoidoscopy every 5 years or a colonoscopy every 10 years starting at age 18. Hepatitis C blood test. Hepatitis B blood test. Sexually transmitted disease (STD) testing. Diabetes screening. This is done by checking your blood sugar (glucose) after you have not  eaten for a while (fasting). You may have this done every 1-3 years. Bone density scan. This is done to screen for osteoporosis. You may have this done starting at age 23. Mammogram. This may be done every 1-2 years. Talk to your health care provider about how often you should have regular mammograms. Talk with your health care provider about your test results, treatment  options, and if necessary, the need for more tests. Vaccines  Your health care provider may recommend certain vaccines, such as: Influenza vaccine. This is recommended every year. Tetanus, diphtheria, and acellular pertussis (Tdap, Td) vaccine. You may need a Td booster every 10 years. Zoster vaccine. You may need this after age 62. Pneumococcal 13-valent conjugate (PCV13) vaccine. One dose is recommended after age 85. Pneumococcal polysaccharide (PPSV23) vaccine. One dose is recommended after age 17. Talk to your health care provider about which screenings and vaccines you need and how often you need them. This information is not intended to replace advice given to you by your health care provider. Make sure you discuss any questions you have with your health care provider. Document Released: 11/28/2015 Document Revised: 07/21/2016 Document Reviewed: 09/02/2015 Elsevier Interactive Patient Education  2017 [employer] [employer] Prevention in the Home Falls can cause injuries. They can happen to people of all ages. There are many things you can do to make your home safe and to help prevent falls. What can I do on the outside of my home? Regularly fix the edges of walkways and driveways and fix any cracks. Remove anything that might make you trip as you walk through a door, such as a raised step or threshold. Trim any bushes or trees on the path to your home. Use bright outdoor lighting. Clear any walking paths of anything that might make someone trip, such as rocks or tools. Regularly check to see if handrails are loose or broken. Make sure that both sides of any steps have handrails. Any raised decks and porches should have guardrails on the edges. Have any leaves, snow, or ice cleared regularly. Use sand or salt on walking paths during winter. Clean up any spills in your garage right away. This includes oil or grease spills. What can I do in the bathroom? Use night lights. Install grab  bars by the toilet and in the tub and shower. Do not use towel bars as grab bars. Use non-skid mats or decals in the tub or shower. If you need to sit down in the shower, use a plastic, non-slip stool. Keep the floor dry. Clean up any water that spills on the floor as soon as it happens. Remove soap buildup in the tub or shower regularly. Attach bath mats securely with double-sided non-slip rug tape. Do not have throw rugs and other things on the floor that can make you trip. What can I do in the bedroom? Use night lights. Make sure that you have a light by your bed that is easy to reach. Do not use any sheets or blankets that are too big for your bed. They should not hang down onto the floor. Have a firm chair that has side arms. You can use this for support while you get dressed. Do not have throw rugs and other things on the floor that can make you trip. What can I do in the kitchen? Clean up any spills right away. Avoid walking on wet floors. Keep items that you use a lot in easy-to-reach places. If you need to reach  something above you, use a strong step stool that has a grab bar. Keep electrical cords out of the way. Do not use floor polish or wax that makes floors slippery. If you must use wax, use non-skid floor wax. Do not have throw rugs and other things on the floor that can make you trip. What can I do with my stairs? Do not leave any items on the stairs. Make sure that there are handrails on both sides of the stairs and use them. Fix handrails that are broken or loose. Make sure that handrails are as long as the stairways. Check any carpeting to make sure that it is firmly attached to the stairs. Fix any carpet that is loose or worn. Avoid having throw rugs at the top or bottom of the stairs. If you do have throw rugs, attach them to the floor with carpet tape. Make sure that you have a light switch at the top of the stairs and the bottom of the stairs. If you do not have them,  ask someone to add them for you. What else can I do to help prevent falls? Wear shoes that: Do not have high heels. Have rubber bottoms. Are comfortable and fit you well. Are closed at the toe. Do not wear sandals. If you use a stepladder: Make sure that it is fully opened. Do not climb a closed stepladder. Make sure that both sides of the stepladder are locked into place. Ask someone to hold it for you, if possible. Clearly mark and make sure that you can see: Any grab bars or handrails. First and last steps. Where the edge of each step is. Use tools that help you move around (mobility aids) if they are needed. These include: Canes. Walkers. Scooters. Crutches. Turn on the lights when you go into a dark area. Replace any light bulbs as soon as they burn out. Set up your furniture so you have a clear path. Avoid moving your furniture around. If any of your floors are uneven, fix them. If there are any pets around you, be aware of where they are. Review your medicines with your doctor. Some medicines can make you feel dizzy. This can increase your chance of falling. Ask your doctor what other things that you can do to help prevent falls. This information is not intended to replace advice given to you by your health care provider. Make sure you discuss any questions you have with your health care provider. Document Released: 08/28/2009 Document Revised: 04/08/2016 Document Reviewed: 12/06/2014 Elsevier Interactive Patient Education  2017 [employer].

## 2023-03-16 ENCOUNTER — Ambulatory Visit: Payer: Medicare Other | Admitting: Internal Medicine

## 2023-04-27 ENCOUNTER — Other Ambulatory Visit: Payer: Self-pay | Admitting: Family Medicine

## 2023-04-27 ENCOUNTER — Telehealth: Payer: Self-pay | Admitting: Family Medicine

## 2023-04-27 DIAGNOSIS — E1165 Type 2 diabetes mellitus with hyperglycemia: Secondary | ICD-10-CM

## 2023-04-27 DIAGNOSIS — I10 Essential (primary) hypertension: Secondary | ICD-10-CM

## 2023-04-27 NOTE — Telephone Encounter (Signed)
Prescription Request  04/27/2023  LOV: 01/20/2023  What is the name of the medication or equipment? glipiZIDE (GLUCOTROL XL) 2.5 MG 24 hr tablet   Have you contacted your pharmacy to request a refill? No   Which pharmacy would you like this sent to?  [facility] [address] [facility] [place], [place]    Patient notified that their request is being sent to the clinical staff for review and that they should receive a response within 2 business days.   Please advise at Mobile [phone] (mobile)     Patient out of town and only using this pharmacy this time.

## 2023-04-28 MED ORDER — GLIPIZIDE ER 2.5 MG PO TB24
2.50 mg | ORAL_TABLET | Freq: Two times a day (BID) | ORAL | 0 refills | Status: DC
Start: 2023-04-28 — End: 2023-05-20

## 2023-04-28 MED ORDER — DILTIAZEM HCL ER BEADS 300 MG PO CP24
ORAL_CAPSULE | ORAL | 0 refills | Status: DC
Start: 2023-04-28 — End: 2023-05-24

## 2023-04-28 NOTE — Telephone Encounter (Signed)
1st fill with you.. ok to refill for 30 days since needs appt?

## 2023-04-28 NOTE — Telephone Encounter (Signed)
Rx sent 

## 2023-04-28 NOTE — Addendum Note (Signed)
Addended by: Marinus Maw on: 04/28/2023 09:56 AM   Modules accepted: Orders

## 2023-05-05 ENCOUNTER — Other Ambulatory Visit: Payer: Self-pay | Admitting: Family Medicine

## 2023-05-05 ENCOUNTER — Ambulatory Visit: Payer: Medicare Other | Admitting: Family Medicine

## 2023-05-05 DIAGNOSIS — E1165 Type 2 diabetes mellitus with hyperglycemia: Secondary | ICD-10-CM

## 2023-05-12 ENCOUNTER — Ambulatory Visit: Payer: Medicare Other | Admitting: Family Medicine

## 2023-05-19 ENCOUNTER — Other Ambulatory Visit: Payer: Self-pay | Admitting: Rheumatology

## 2023-05-20 ENCOUNTER — Telehealth: Payer: Self-pay | Admitting: Family Medicine

## 2023-05-20 DIAGNOSIS — E1165 Type 2 diabetes mellitus with hyperglycemia: Secondary | ICD-10-CM

## 2023-05-20 MED ORDER — GLIPIZIDE ER 2.5 MG PO TB24
2.5000 mg | ORAL_TABLET | Freq: Two times a day (BID) | ORAL | 0 refills | Status: DC
Start: 2023-05-20 — End: 2023-06-07

## 2023-05-20 NOTE — Telephone Encounter (Signed)
Rx sent 

## 2023-05-20 NOTE — Telephone Encounter (Signed)
Patient has been scheduled for soonest availability on 05/25/2023. Her daughters called and said she needs a refill of her glipizide sent to [id number] - [place], [place] - [address]. She is staying with her other daughter in [place] temporarily. Best callback for Jacki Cones is [phone].

## 2023-05-21 ENCOUNTER — Other Ambulatory Visit: Payer: Self-pay | Admitting: Family Medicine

## 2023-05-24 ENCOUNTER — Other Ambulatory Visit: Payer: Self-pay | Admitting: Family Medicine

## 2023-05-24 DIAGNOSIS — I10 Essential (primary) hypertension: Secondary | ICD-10-CM

## 2023-05-25 ENCOUNTER — Ambulatory Visit (INDEPENDENT_AMBULATORY_CARE_PROVIDER_SITE_OTHER): Payer: Medicare Other

## 2023-05-25 ENCOUNTER — Ambulatory Visit (INDEPENDENT_AMBULATORY_CARE_PROVIDER_SITE_OTHER): Payer: Medicare Other | Admitting: Family Medicine

## 2023-05-25 ENCOUNTER — Encounter: Payer: Self-pay | Admitting: Family Medicine

## 2023-05-25 VITALS — BP 140/80 | HR 90 | Temp 98.3°F | Ht 64.0 in | Wt 147.0 lb

## 2023-05-25 DIAGNOSIS — E1169 Type 2 diabetes mellitus with other specified complication: Secondary | ICD-10-CM

## 2023-05-25 DIAGNOSIS — E559 Vitamin D deficiency, unspecified: Secondary | ICD-10-CM | POA: Diagnosis not present

## 2023-05-25 DIAGNOSIS — E785 Hyperlipidemia, unspecified: Secondary | ICD-10-CM

## 2023-05-25 DIAGNOSIS — I10 Essential (primary) hypertension: Secondary | ICD-10-CM

## 2023-05-25 DIAGNOSIS — M170 Bilateral primary osteoarthritis of knee: Secondary | ICD-10-CM

## 2023-05-25 DIAGNOSIS — E538 Deficiency of other specified B group vitamins: Secondary | ICD-10-CM

## 2023-05-25 DIAGNOSIS — M25562 Pain in left knee: Secondary | ICD-10-CM

## 2023-05-25 DIAGNOSIS — Z7984 Long term (current) use of oral hypoglycemic drugs: Secondary | ICD-10-CM

## 2023-05-25 DIAGNOSIS — E1165 Type 2 diabetes mellitus with hyperglycemia: Secondary | ICD-10-CM | POA: Diagnosis not present

## 2023-05-25 DIAGNOSIS — R413 Other amnesia: Secondary | ICD-10-CM | POA: Diagnosis not present

## 2023-05-25 DIAGNOSIS — R6889 Other general symptoms and signs: Secondary | ICD-10-CM | POA: Diagnosis not present

## 2023-05-25 LAB — CBC WITH DIFFERENTIAL/PLATELET
Basophils Absolute: 0.1 10*3/uL (ref 0.0–0.1)
Basophils Relative: 1.2 % (ref 0.0–3.0)
Eosinophils Absolute: 0.1 10*3/uL (ref 0.0–0.7)
Eosinophils Relative: 2.5 % (ref 0.0–5.0)
HCT: 35.6 % — ABNORMAL LOW (ref 36.0–46.0)
Hemoglobin: 11.9 g/dL — ABNORMAL LOW (ref 12.0–15.0)
Lymphocytes Relative: 42.1 % (ref 12.0–46.0)
Lymphs Abs: 2.3 10*3/uL (ref 0.7–4.0)
MCHC: 33.5 g/dL (ref 30.0–36.0)
MCV: 85.1 fl (ref 78.0–100.0)
Monocytes Absolute: 0.4 10*3/uL (ref 0.1–1.0)
Monocytes Relative: 7 % (ref 3.0–12.0)
Neutro Abs: 2.6 10*3/uL (ref 1.4–7.7)
Neutrophils Relative %: 47.2 % (ref 43.0–77.0)
Platelets: 301 10*3/uL (ref 150.0–400.0)
RBC: 4.19 Mil/uL (ref 3.87–5.11)
RDW: 13.7 % (ref 11.5–15.5)
WBC: 5.5 10*3/uL (ref 4.0–10.5)

## 2023-05-25 LAB — COMPREHENSIVE METABOLIC PANEL
ALT: 8 U/L (ref 0–35)
AST: 11 U/L (ref 0–37)
Albumin: 4 g/dL (ref 3.5–5.2)
Alkaline Phosphatase: 98 U/L (ref 39–117)
BUN: 10 mg/dL (ref 6–23)
CO2: 31 mEq/L (ref 19–32)
Calcium: 10 mg/dL (ref 8.4–10.5)
Chloride: 102 mEq/L (ref 96–112)
Creatinine, Ser: 0.82 mg/dL (ref 0.40–1.20)
GFR: 69.72 mL/min (ref >60.00)
Glucose, Bld: 139 mg/dL — ABNORMAL HIGH (ref 70–99)
Potassium: 3.6 mEq/L (ref 3.5–5.1)
Sodium: 140 mEq/L (ref 135–145)
Total Bilirubin: 0.3 mg/dL (ref 0.2–1.2)
Total Protein: 7.5 g/dL (ref 6.0–8.3)

## 2023-05-25 LAB — TSH: TSH: 1.41 u[IU]/mL (ref 0.35–5.50)

## 2023-05-25 LAB — VITAMIN D 25 HYDROXY (VIT D DEFICIENCY, FRACTURES): VITD: 15.48 ng/mL — ABNORMAL LOW (ref 30.00–100.00)

## 2023-05-25 LAB — VITAMIN B12: Vitamin B-12: 353 pg/mL (ref 211–911)

## 2023-05-25 LAB — HEMOGLOBIN A1C: Hgb A1c MFr Bld: 8.7 % — ABNORMAL HIGH (ref 4.6–6.5)

## 2023-05-25 LAB — T4, FREE: Free T4: 0.91 ng/dL (ref 0.60–1.60)

## 2023-05-25 NOTE — Progress Notes (Signed)
Subjective:     Patient ID: [name] [name], female    DOB: [birth year], 77 y.o.   MRN: [id number]  Chief Complaint  Patient presents with   Follow-up    Pt support states that she is needing to check on her diabetes levels and pt also stated that her knee has been hurting her. Pt is also  complaining about her left one     HPI  Discussed the use of AI scribe software for clinical note transcription with the patient, who gave verbal consent to proceed.  History of Present Illness         Here to follow up on uncontrolled DM. Last A1c 8.9% in February.  Living with her daughter and son in law.  Her other daughter is also present today.   BS at home in the 150 range.   Taking Januvia and glipizide.   Started her on pravastatin but states she had memory issues while on the medication. They are not sure memory improved after stopping pravastatin but would like to avoid statins.   C/o short term memory loss. She saw neurology and they prescribed Aricept. She is not taking this.   HTN- has been on diltiazem for years, at least since 2013, for HTN per family. She has not seen cardiology.   Vitamin D def- currently is not taking a supplement.   B12 def- is not current taking B12   C/o left knee pain and swelling x 1 week. No injury. Hx of osteoarthritis and pseudogout. No locking or giving away.    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   Osteoarthritis of  both feet 09/14/2016   Osteoarthritis of both hands 09/14/2016   Osteoarthritis of both knees 09/14/2016   Osteopenia    per DEXA 4/10   Recurrent boils    h/o MRSA   Type 2 diabetes mellitus without complication (HCC) 12/17/2020    Past Surgical History:  Procedure Laterality Date   CHOLECYSTECTOMY     TONSILLECTOMY      Family History  Problem Relation Age of Onset   Hypertension Mother    Diabetes Mother    Hypertension Father    Diabetes Father    Hypertension Daughter    Hypertension Sister    Diabetes Sister    Coronary artery disease Neg Hx    Stroke Neg Hx    Colon cancer Neg Hx    Breast cancer Neg Hx     Social History   Socioeconomic History   Marital status: Married    Spouse name: herbert   Number of children: 3   Years of education: college   Highest education level: Not on file  Occupational History   Occupation: Retired- 2009, doing part time work sometimes     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   Patient is right handed.   Patient has college education.   Patient drinks 3-4 daily.   Social Determinants of Health   Financial Resource Strain: Low Risk  (03/14/2023)   Overall Financial Resource Strain (CARDIA)    Difficulty of Paying Living Expenses: Not hard at all  Food Insecurity: No Food Insecurity (03/14/2023)   Hunger Vital Sign    Worried About Running Out of Food in the Last Year: Never true    Ran Out of Food in the Last Year: Never true  Transportation Needs: No Transportation Needs (03/14/2023)   PRAPARE - Administrator, Civil Service  (Medical): No    Lack of Transportation (Non-Medical): No  Physical Activity: Inactive (03/14/2023)   Exercise Vital Sign    Days of Exercise per Week: 0 days    Minutes of Exercise per Session: 0 min  Stress: No Stress Concern Present (03/14/2023)   [employer] of Occupational Health - Occupational Stress Questionnaire    Feeling of Stress : Not at all  Social Connections: Socially Integrated (03/14/2023)   Social Connection and Isolation Panel [NHANES]    Frequency of Communication with Friends and Family: More than three times a week    Frequency of Social Gatherings with Friends and Family: More than three times a week    Attends Religious Services: More than 4 times per year    Active Member of [employer] or Organizations: Yes    Attends Engineer, structural: More than 4 times per year    Marital Status: Married  Catering manager Violence: Not At Risk (03/14/2023)   Humiliation, Afraid, Rape, and Kick questionnaire    Fear of Current or Ex-Partner: No    Emotionally Abused: No    Physically Abused: No    Sexually Abused: No    Outpatient Medications Prior to Visit  Medication Sig Dispense Refill   aspirin 81 MG tablet Take 81 mg by mouth daily.     Continuous Blood Gluc Receiver (DEXCOM G7 RECEIVER) DEVI Use to check Blood sugars up to 3 times daily 1 each 0   Continuous Blood Gluc Sensor (DEXCOM G7 SENSOR) MISC Use to check blood sugars up to 3 times daily. Replace sensor every 10 days 3 each 4   diltiazem (TIADYLT ER) 300 MG 24 hr capsule TAKE 1 CAPSULE BY MOUTH EVERY DAY 30 capsule 0   donepezil (ARICEPT) 5 MG tablet Take 1 tablet (5 mg total) by mouth at bedtime. 30 tablet 3   glipiZIDE (GLUCOTROL XL) 2.5 MG 24 hr tablet Take 1 tablet (2.5 mg total) by mouth 2 (two) times daily. 30 tablet 0   glucose blood (ONETOUCH VERIO) test strip USE TO TEST BLOOD SUGAR 3 TIMES DAILY 100 each 0   JANUVIA 100 MG tablet Take 1 tablet (100 mg total) by mouth daily. 90 tablet 0    Potassium 99 MG TABS Take by mouth.     Vitamin D, Ergocalciferol, (DRISDOL) 1.25 MG (50000 UNIT) CAPS capsule Take  1 capsule (50,000 Units total) by mouth every 7 (seven) days. 4 capsule 1   No facility-administered medications prior to visit.    Allergies  Allergen Reactions   Colchicine Nausea Only   Hydrocodone Other (See Comments)   Sulindac Other (See Comments)   Tramadol Other (See Comments)    "Made me feel like I was in another world."   Celecoxib Rash   Glimepiride Rash    Review of Systems  Constitutional:  Negative for chills, fever, malaise/fatigue and weight loss.  Respiratory:  Negative for shortness of breath.   Cardiovascular:  Negative for chest pain, palpitations, claudication and leg swelling.  Gastrointestinal:  Negative for abdominal pain, constipation, diarrhea, nausea and vomiting.  Genitourinary:  Negative for dysuria, frequency and urgency.  Musculoskeletal:  Positive for joint pain. Negative for back pain, falls and neck pain.  Neurological:  Negative for dizziness, sensory change, focal weakness, weakness and headaches.  Psychiatric/Behavioral:  Positive for memory loss. Negative for depression. The patient is not nervous/anxious.        Objective:    Physical Exam Constitutional:      General: She is not in acute distress.    Appearance: She is not ill-appearing.  HENT:     Mouth/Throat:     Mouth: Mucous membranes are moist.     Pharynx: Oropharynx is clear.  Eyes:     Extraocular Movements: Extraocular movements intact.     Conjunctiva/sclera: Conjunctivae normal.  Cardiovascular:     Rate and Rhythm: Normal rate and regular rhythm.  Pulmonary:     Effort: Pulmonary effort is normal.     Breath sounds: Normal breath sounds.  Musculoskeletal:     Cervical back: Normal range of motion and neck supple.     Left knee: No swelling, effusion, erythema or bony tenderness. Normal range of motion. Tenderness present over the medial joint line.  Normal pulse.     Right lower leg: No edema.     Left lower leg: No edema.     Comments: No obvious laxity  Skin:    General: Skin is warm and dry.  Neurological:     General: No focal deficit present.     Mental Status: She is alert and oriented to person, place, and time.     Motor: No weakness.     Coordination: Coordination normal.  Psychiatric:        Attention and Perception: Attention normal.        Mood and Affect: Mood normal.        Speech: Speech normal.        Behavior: Behavior normal.        Thought Content: Thought content normal.      BP (!) 140/80 (BP Location: Right Arm, Patient Position: Sitting, Cuff Size: Normal)   Pulse 90   Temp 98.3 F (36.8 C) (Oral)   Ht 5\' 4"  (1.626 m)   Wt 147 lb (66.7 kg)   SpO2 98%   BMI 25.23 kg/m  Wt Readings from Last 3 Encounters:  05/25/23 147 lb (66.7 kg)  03/14/23 145 lb (65.8 kg)  02/16/23 145 lb (65.8 kg)       Assessment & Plan:   Problem List Items Addressed This Visit       Cardiovascular and Mediastinum   Essential hypertension    Not well controlled. Continue Diltiazem, she has been on this medication since 2013 or longer. Consider adding losartan. Check renal function.  Relevant Orders   CBC with Differential/Platelet (Completed)   Comprehensive metabolic panel (Completed)   TSH (Completed)   T4, free (Completed)     Endocrine   Hyperlipidemia associated with type 2 diabetes mellitus (HCC)    Stopped pravastatin and declines to take statins. Would like to work on diet.   The 10-year ASCVD risk score (Arnett DK, et al., 2019) is: 40.9%   Values used to calculate the score:     Age: 81 years     Sex: Female     Is Non-Hispanic African American: Yes     Diabetic: Yes     Tobacco smoker: No     Systolic Blood Pressure: 140 mmHg     Is BP treated: Yes     HDL Cholesterol: 76.3 mg/dL     Total Cholesterol: 202 mg/dL        Uncontrolled diabetes mellitus with hyperglycemia, without  long-term current use of insulin (HCC) - Primary    A1c has not been at goal. Reports recently monitoring BS and good compliance with medications. Follow up pending A1c results.  Reports being intolerant of statins.       Relevant Orders   CBC with Differential/Platelet (Completed)   Comprehensive metabolic panel (Completed)   Hemoglobin A1c (Completed)   TSH (Completed)   T4, free (Completed)     Musculoskeletal and Integument   Osteoarthritis of both knees   Relevant Orders   DG Knee 1-2 Views Left     Other   B12 deficiency    Check vitamin B12      Relevant Orders   Iron, TIBC and Ferritin Panel   Vitamin B12 (Completed)   Left medial knee pain    X ray ordered. Conservative management discussed.       Relevant Orders   DG Knee 1-2 Views Left   Vitamin D deficiency    Check vitamin D level and replace       Relevant Orders   VITAMIN D 25 Hydroxy (Vit-D Deficiency, Fractures) (Completed)   Other Visit Diagnoses     Memory changes       Relevant Orders   Iron, TIBC and Ferritin Panel   TSH (Completed)   T4, free (Completed)   Zinc   Vitamin B1   Sensation of feeling cold       Relevant Orders   Iron, TIBC and Ferritin Panel   TSH (Completed)   T4, free (Completed)   Zinc   Vitamin B1       I am having Tyrianna M. Cremer maintain her aspirin, glucose blood, Potassium, Dexcom G7 Sensor, Dexcom G7 Receiver, donepezil, Vitamin D (Ergocalciferol), glipiZIDE, Januvia, and Tiadylt ER.  No orders of the defined types were placed in this encounter.

## 2023-05-25 NOTE — Patient Instructions (Addendum)
Please go downstairs for labs and a knee X ray before you leave.   We will be in touch with your results and recommendations.   Continue to monitor your blood sugars and blood pressures at home.   Eat a diet low in saturated fats and carbohydrates such as potatoes, pasta, bread and rice.   Eat plenty of fiber.   For your knee pain, try over the counter Voltaren gel, ice packs, elevating your leg and ibuprofen 400 to 600 mg every 8 hours as needed for the next 3-4 days.   Let me know if it is not improving.

## 2023-05-26 DIAGNOSIS — M25562 Pain in left knee: Secondary | ICD-10-CM | POA: Insufficient documentation

## 2023-05-26 NOTE — Assessment & Plan Note (Signed)
Stopped pravastatin and declines to take statins. Would like to work on diet.   The 10-year ASCVD risk score (Arnett DK, et al., 2019) is: 40.9%   Values used to calculate the score:     Age: 77 years     Sex: Female     Is Non-Hispanic African American: Yes     Diabetic: Yes     Tobacco smoker: No     Systolic Blood Pressure: 140 mmHg     Is BP treated: Yes     HDL Cholesterol: 76.3 mg/dL     Total Cholesterol: 202 mg/dL

## 2023-05-26 NOTE — Assessment & Plan Note (Signed)
X ray ordered. Conservative management discussed.

## 2023-05-26 NOTE — Assessment & Plan Note (Signed)
Check vitamin B12 

## 2023-05-26 NOTE — Assessment & Plan Note (Signed)
A1c has not been at goal. Reports recently monitoring BS and good compliance with medications. Follow up pending A1c results.  Reports being intolerant of statins.

## 2023-05-26 NOTE — Assessment & Plan Note (Signed)
Not well controlled. Continue Diltiazem, she has been on this medication since 2013 or longer. Consider adding losartan. Check renal function.

## 2023-05-26 NOTE — Assessment & Plan Note (Signed)
Check vitamin D level and replace

## 2023-05-31 ENCOUNTER — Telehealth: Payer: Self-pay | Admitting: Family Medicine

## 2023-05-31 NOTE — Telephone Encounter (Signed)
Patient's daughter Hilbert Corrigan called about the results of patient's recent labs and imaging. She would like a call back at [phone].

## 2023-05-31 NOTE — Telephone Encounter (Signed)
Called and gave daughter imaging results, notified her labs have not been resulted by provider yet. Lorrie verbalized understanding

## 2023-05-31 NOTE — Progress Notes (Signed)
Her knee X ray shows degenerative changes, significant. If her pain is not improving, I recommend seeing an orthopedist or sports medicine provider.

## 2023-06-01 ENCOUNTER — Other Ambulatory Visit: Payer: Self-pay | Admitting: Family Medicine

## 2023-06-01 DIAGNOSIS — E1165 Type 2 diabetes mellitus with hyperglycemia: Secondary | ICD-10-CM

## 2023-06-01 DIAGNOSIS — E559 Vitamin D deficiency, unspecified: Secondary | ICD-10-CM

## 2023-06-01 MED ORDER — VITAMIN D (ERGOCALCIFEROL) 1.25 MG (50000 UNIT) PO CAPS: 50000.0000 [IU] | ORAL_CAPSULE | ORAL | 1 refills | Status: DC

## 2023-06-01 MED ORDER — EMPAGLIFLOZIN 10 MG PO TABS
10.0000 mg | ORAL_TABLET | Freq: Every day | ORAL | 2 refills | Status: DC
Start: 2023-06-01 — End: 2023-06-02

## 2023-06-01 NOTE — Progress Notes (Signed)
I added a diabetes medication to take once daily called Jardiance. Her Hgb A1c is 8.7% and her diabetes need to be better controlled. Continue her current medications. Monitor her blood sugars daily. Also, her vitamin D is low. I sent in a prescription for her to take ONCE WEEKLY for the next 8 weeks. I also recommend that she take a multivitamin over the counter if she is not already. Follow up with me in 3 months please.

## 2023-06-02 MED ORDER — VITAMIN D (ERGOCALCIFEROL) 1.25 MG (50000 UNIT) PO CAPS
50000.0000 [IU] | ORAL_CAPSULE | ORAL | 1 refills | Status: DC
Start: 2023-06-02 — End: 2024-09-12

## 2023-06-02 MED ORDER — EMPAGLIFLOZIN 10 MG PO TABS
10.0000 mg | ORAL_TABLET | Freq: Every day | ORAL | 2 refills | Status: DC
Start: 2023-06-02 — End: 2024-09-12

## 2023-06-02 NOTE — Telephone Encounter (Signed)
Was able to relay lab results to daughter, still just waiting on Zinc to be resulted

## 2023-06-02 NOTE — Addendum Note (Signed)
Addended by: Marinus Maw on: 06/02/2023 01:21 PM   Modules accepted: Orders

## 2023-06-03 LAB — IRON,TIBC AND FERRITIN PANEL
%SAT: 22 % (calc) (ref 16–45)
Ferritin: 79 ng/mL (ref 16–288)
Iron: 62 ug/dL (ref 45–160)
TIBC: 286 mcg/dL (calc) (ref 250–450)

## 2023-06-03 LAB — VITAMIN B1: Vitamin B1 (Thiamine): 9 nmol/L (ref 8–30)

## 2023-06-03 LAB — ZINC: Zinc: 61 ug/dL (ref 60–130)

## 2023-06-07 ENCOUNTER — Telehealth: Payer: Self-pay | Admitting: Family Medicine

## 2023-06-07 DIAGNOSIS — E1165 Type 2 diabetes mellitus with hyperglycemia: Secondary | ICD-10-CM

## 2023-06-07 MED ORDER — GLIPIZIDE ER 2.5 MG PO TB24
2.5000 mg | ORAL_TABLET | Freq: Two times a day (BID) | ORAL | 1 refills | Status: DC
Start: 2023-06-07 — End: 2023-12-09

## 2023-06-07 NOTE — Telephone Encounter (Signed)
Rx sent 

## 2023-06-07 NOTE — Telephone Encounter (Signed)
Prescription Request  06/07/2023  LOV: 05/25/2023  What is the name of the medication or equipment? glipizide  Have you contacted your pharmacy to request a refill? Yes   Which pharmacy would you like this sent to?   [id number] - [place], [place] - [address] [place] [address] Phone: [phone] Fax: [phone]    Patient notified that their request is being sent to the clinical staff for review and that they should receive a response within 2 business days.   Please advise at Mobile [phone] (mobile)

## 2023-06-23 ENCOUNTER — Other Ambulatory Visit: Payer: Self-pay | Admitting: Family Medicine

## 2023-06-23 DIAGNOSIS — I10 Essential (primary) hypertension: Secondary | ICD-10-CM

## 2023-06-30 ENCOUNTER — Encounter: Payer: Self-pay | Admitting: Family Medicine

## 2023-06-30 ENCOUNTER — Ambulatory Visit (INDEPENDENT_AMBULATORY_CARE_PROVIDER_SITE_OTHER): Payer: Medicare Other

## 2023-06-30 ENCOUNTER — Ambulatory Visit (INDEPENDENT_AMBULATORY_CARE_PROVIDER_SITE_OTHER): Payer: Medicare Other | Admitting: Family Medicine

## 2023-06-30 VITALS — BP 132/82 | HR 85 | Temp 97.6°F | Ht 64.0 in | Wt 148.0 lb

## 2023-06-30 DIAGNOSIS — M545 Low back pain, unspecified: Secondary | ICD-10-CM

## 2023-06-30 DIAGNOSIS — M159 Polyosteoarthritis, unspecified: Secondary | ICD-10-CM | POA: Diagnosis not present

## 2023-06-30 NOTE — Progress Notes (Signed)
Subjective:     Patient ID: [name] [name], female    DOB: 15-Sep-1946, 77 y.o.   MRN: [id number]  Chief Complaint  Patient presents with   Back Pain    Lower back achy shooting pain for the last 3 weeks, constant     HPI  Discussed the use of AI scribe software for clinical note transcription with the patient, who gave verbal consent to proceed.  History of Present Illness         C/o bilateral low back pain. No sciatica. NKI. Hx of same. Pain with movement. Able to sleep without pain.   No numbness, tingling or weakness.      Health Maintenance Due  Topic Date Due   MAMMOGRAM  10/05/2016   OPHTHALMOLOGY EXAM  11/01/2017   DEXA SCAN  08/06/2018   Pneumonia Vaccine 73+ Years old (3 of 3 - PPSV23 or PCV20) 01/16/2019   Colonoscopy  12/15/2020   FOOT EXAM  06/02/2022   INFLUENZA VACCINE  06/16/2023    Past Medical History:  Diagnosis Date   Bilateral knee pain    Euflexxa x3   Chest pain 06/2007   CP and QW on EKG: neg stress test    Diabetes mellitus    w/ neuropathy   Diverticulitis 2000   Gout    Hyperlipidemia    Hypertension    Insomnia 02/20/2013   Osteoarthritis    gout, CPPD--- sees Dr.Davenshwar   Osteoarthritis of both feet 09/14/2016   Osteoarthritis of both hands 09/14/2016   Osteoarthritis of both knees 09/14/2016   Osteopenia    per DEXA 4/10   Recurrent boils    h/o MRSA   Type 2 diabetes mellitus without complication (HCC) 12/17/2020    Past Surgical History:  Procedure Laterality Date   CHOLECYSTECTOMY     TONSILLECTOMY      Family History  Problem Relation Age of Onset   Hypertension Mother    Diabetes Mother    Hypertension Father    Diabetes Father    Hypertension Daughter    Hypertension Sister    Diabetes Sister    Coronary artery disease Neg Hx    Stroke Neg Hx    Colon cancer Neg Hx    Breast cancer Neg Hx     Social History   Socioeconomic History   Marital status: Married    Spouse name: herbert   Number of  children: 3   Years of education: college   Highest education level: Not on file  Occupational History   Occupation: Retired- 2009, doing part time work sometimes     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   Patient is right handed.   Patient has college education.   Patient drinks 3-4 daily.   Social Determinants of Health   Financial Resource Strain: Low Risk  (03/14/2023)   Overall Financial Resource Strain (CARDIA)    Difficulty of Paying Living Expenses: Not hard at all  Food Insecurity: No Food Insecurity (03/14/2023)   Hunger  Vital Sign    Worried About Programme researcher, broadcasting/film/video in the Last Year: Never true    Ran Out of Food in the Last Year: Never true  Transportation Needs: No Transportation Needs (03/14/2023)   PRAPARE - Administrator, Civil Service (Medical): No    Lack of Transportation (Non-Medical): No  Physical Activity: Inactive (03/14/2023)   Exercise Vital Sign    Days of Exercise per Week: 0 days    Minutes of Exercise per Session: 0 min  Stress: No Stress Concern Present (03/14/2023)   [employer] of Occupational Health - Occupational Stress Questionnaire    Feeling of Stress : Not at all  Social Connections: Socially Integrated (03/14/2023)   Social Connection and Isolation Panel [NHANES]    Frequency of Communication with Friends and Family: More than three times a week    Frequency of Social Gatherings with Friends and Family: More than three times a week    Attends Religious Services: More than 4 times per year    Active Member of [employer] or Organizations: Yes     Attends Engineer, structural: More than 4 times per year    Marital Status: Married  Catering manager Violence: Not At Risk (03/14/2023)   Humiliation, Afraid, Rape, and Kick questionnaire    Fear of Current or Ex-Partner: No    Emotionally Abused: No    Physically Abused: No    Sexually Abused: No    Outpatient Medications Prior to Visit  Medication Sig Dispense Refill   aspirin 81 MG tablet Take 81 mg by mouth daily.     Continuous Blood Gluc Receiver (DEXCOM G7 RECEIVER) DEVI Use to check Blood sugars up to 3 times daily 1 each 0   Continuous Blood Gluc Sensor (DEXCOM G7 SENSOR) MISC Use to check blood sugars up to 3 times daily. Replace sensor every 10 days 3 each 4   diltiazem (TIADYLT ER) 300 MG 24 hr capsule TAKE 1 CAPSULE BY MOUTH EVERY DAY 90 capsule 0   donepezil (ARICEPT) 5 MG tablet Take 1 tablet (5 mg total) by mouth at bedtime. 30 tablet 3   empagliflozin (JARDIANCE) 10 MG TABS tablet Take 1 tablet (10 mg total) by mouth daily. 30 tablet 2   glipiZIDE (GLUCOTROL XL) 2.5 MG 24 hr tablet Take 1 tablet (2.5 mg total) by mouth 2 (two) times daily. 180 tablet 1   glucose blood (ONETOUCH VERIO) test strip USE TO TEST BLOOD SUGAR 3 TIMES DAILY 100 each 0   JANUVIA 100 MG tablet Take 1 tablet (100 mg total) by mouth daily. 90 tablet 0   Potassium 99 MG TABS Take by mouth.     Vitamin D, Ergocalciferol, (DRISDOL) 1.25 MG (50000 UNIT) CAPS capsule Take 1 capsule (50,000 Units total) by mouth every 7 (seven) days. 4 capsule 1   No facility-administered medications prior to visit.    Allergies  Allergen Reactions   Colchicine Nausea Only   Hydrocodone Other (See Comments)   Sulindac Other (See Comments)   Tramadol Other (See Comments)    "Made me feel like I was in another world."   Celecoxib Rash   Glimepiride Rash    Review of Systems  Constitutional:  Negative for chills and fever.  Respiratory:  Negative for shortness of breath.   Cardiovascular:  Negative for  chest pain, palpitations and leg swelling.  Gastrointestinal:  Negative for abdominal pain, constipation, diarrhea, nausea and vomiting.  Genitourinary:  Negative for dysuria, frequency and  urgency.  Musculoskeletal:  Positive for back pain.  Neurological:  Negative for dizziness, sensory change and focal weakness.       Objective:    Physical Exam Constitutional:      General: She is not in acute distress.    Appearance: She is not ill-appearing.  Eyes:     Extraocular Movements: Extraocular movements intact.     Conjunctiva/sclera: Conjunctivae normal.  Cardiovascular:     Rate and Rhythm: Normal rate.  Pulmonary:     Effort: Pulmonary effort is normal.  Musculoskeletal:     Cervical back: Normal, normal range of motion and neck supple.     Thoracic back: Normal.     Lumbar back: Tenderness present. No bony tenderness. Decreased range of motion. Negative right straight leg raise test and negative left straight leg raise test.     Comments: Lumbar paraspinal muscle TTP. Limited ROM due to pain  Skin:    General: Skin is warm and dry.  Neurological:     General: No focal deficit present.     Mental Status: She is alert and oriented to person, place, and time.     Sensory: No sensory deficit.     Motor: No weakness.     Gait: Gait normal.  Psychiatric:        Mood and Affect: Mood normal.        Behavior: Behavior normal.        Thought Content: Thought content normal.      BP 132/82 (BP Location: Left Arm, Patient Position: Sitting, Cuff Size: Normal)   Pulse 85   Temp 97.6 F (36.4 C) (Temporal)   Ht 5\' 4"  (1.626 m)   Wt 148 lb (67.1 kg)   SpO2 100%   BMI 25.40 kg/m  Wt Readings from Last 3 Encounters:  06/30/23 148 lb (67.1 kg)  05/25/23 147 lb (66.7 kg)  03/14/23 145 lb (65.8 kg)       Assessment & Plan:   Problem List Items Addressed This Visit       Musculoskeletal and Integument   Osteoarthritis     Other   Low back pain - Primary   Relevant  Orders   DG Lumbar Spine Complete (Completed)   No red flag symptoms.  Stat LS X ray ordered.  Discussed pain management. Tylenol and ibuprofen. Heating pad and topical analgesic.  Follow up if worsening or new symptoms. Follow up if not improving.   I am having Rozell Searing. Kilts maintain her aspirin, glucose blood, Potassium, Dexcom G7 Sensor, Dexcom G7 Receiver, donepezil, Januvia, empagliflozin, Vitamin D (Ergocalciferol), glipiZIDE, and Tiadylt ER.  No orders of the defined types were placed in this encounter.

## 2023-06-30 NOTE — Patient Instructions (Signed)
Please go downstairs for an x-ray of your low back before you leave.  Take Tylenol arthritis 1-2 tablets twice daily.  Take ibuprofen 400 mg every 8 hours with food for the next 2 to 3 days and then stop.  Continue using Salonpas with lidocaine  Start using a heating pad.  Let me know if your pain is getting worse or if you have any new symptoms.  Let me know if you are not improving in the next 2 weeks.

## 2023-06-30 NOTE — Progress Notes (Signed)
Her X ray shows chronic degenerative changes and no fracture or acute finding. No change needed to the treatment plan

## 2023-07-13 ENCOUNTER — Ambulatory Visit (INDEPENDENT_AMBULATORY_CARE_PROVIDER_SITE_OTHER): Payer: Medicare Other | Admitting: Podiatry

## 2023-07-13 ENCOUNTER — Encounter: Payer: Self-pay | Admitting: Podiatry

## 2023-07-13 DIAGNOSIS — E119 Type 2 diabetes mellitus without complications: Secondary | ICD-10-CM

## 2023-07-13 DIAGNOSIS — M79674 Pain in right toe(s): Secondary | ICD-10-CM | POA: Diagnosis not present

## 2023-07-13 DIAGNOSIS — M2012 Hallux valgus (acquired), left foot: Secondary | ICD-10-CM | POA: Diagnosis not present

## 2023-07-13 DIAGNOSIS — M2011 Hallux valgus (acquired), right foot: Secondary | ICD-10-CM | POA: Diagnosis not present

## 2023-07-13 DIAGNOSIS — M79675 Pain in left toe(s): Secondary | ICD-10-CM

## 2023-07-13 DIAGNOSIS — B351 Tinea unguium: Secondary | ICD-10-CM | POA: Diagnosis not present

## 2023-07-13 DIAGNOSIS — M2141 Flat foot [pes planus] (acquired), right foot: Secondary | ICD-10-CM | POA: Diagnosis not present

## 2023-07-13 DIAGNOSIS — M2142 Flat foot [pes planus] (acquired), left foot: Secondary | ICD-10-CM

## 2023-07-16 NOTE — Progress Notes (Signed)
ANNUAL DIABETIC FOOT EXAM  Subjective: [name] presents today annual diabetic foot exam. Her daughter is present during today's visit. Chief Complaint  Patient presents with   Nail Problem    DFC,A1C:8.7,lov:  08/24 Referring Provider [name] Shackleton, NP-C      Patient confirms h/o diabetes.  Patient denies any h/o foot wounds.  Patient denies any numbness, tingling, burning, or pins/needle sensation in feet.  Risk factors: diabetes, HTN, CKD, dyslipidemia.  [name] Shackleton, NP-C is patient's PCP.  Past Medical History:  Diagnosis Date   Bilateral knee pain    Euflexxa x3   Chest pain 06/2007   CP and QW on EKG: neg stress test    Diabetes mellitus    w/ neuropathy   Diverticulitis 2000   Gout    Hyperlipidemia    Hypertension    Insomnia 02/20/2013   Osteoarthritis    gout, CPPD--- sees Dr.Davenshwar   Osteoarthritis of both feet 09/14/2016   Osteoarthritis of both hands 09/14/2016   Osteoarthritis of both knees 09/14/2016   Osteopenia    per DEXA 4/10   Recurrent boils    h/o MRSA   Type 2 diabetes mellitus without complication (HCC) 12/17/2020   Patient Active Problem List   Diagnosis Date Noted   Left medial knee pain 05/26/2023   Leukocytes in urine 01/20/2023   B12 deficiency 01/20/2023   Stage 3 chronic kidney disease (HCC) 01/13/2023   Cognitive changes 01/13/2023   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) 02/02/2022   Snores 11/05/2021   Pseudophakia of left eye 10/14/2021   Vitreous hemorrhage of left eye (HCC) 09/15/2021   Vitreomacular adhesion of right eye 09/15/2021   Proliferative diabetic retinopathy, left eye (HCC) 09/15/2021   Age-related nuclear cataract, right 09/15/2021   Osteoarthritis of right glenohumeral joint 02/03/2021   Overweight 12/17/2020   Vitamin D deficiency 12/17/2020   CPPD knees hands 12/08/2016   History of hypertension 12/08/2016   History of diabetes mellitus 12/08/2016    Dyslipidemia 12/08/2016   Osteoarthritis of both hands 09/14/2016   Osteoarthritis of both feet 09/14/2016   Osteoarthritis of both knees 09/14/2016   PCP NOTES >>> 08/19/2015   Low back pain 09/09/2014   Neuralgia of left saphenous nerve 06/05/2014   Numbness and tingling in left arm 06/05/2014   Paresthesia of left arm 05/21/2014   Gastroparesis 02/20/2013   Insomnia 02/20/2013   Annual physical exam 03/31/2012   History of diverticulitis 04/14/2010   Osteopenia 05/14/2009   DYSPHAGIA UNSPECIFIED 01/10/2009   Pseudogout and DJD 09/12/2008   Hyperlipidemia associated with type 2 diabetes mellitus (HCC) 06/14/2007   Essential hypertension 06/14/2007   Osteoarthritis 06/14/2007   Uncontrolled diabetes mellitus with hyperglycemia, without long-term current use of insulin (HCC) 01/02/2007   Past Surgical History:  Procedure Laterality Date   CHOLECYSTECTOMY     TONSILLECTOMY     Current Outpatient Medications on File Prior to Visit  Medication Sig Dispense Refill   aspirin 81 MG tablet Take 81 mg by mouth daily.     Continuous Blood Gluc Receiver (DEXCOM G7 RECEIVER) DEVI Use to check Blood sugars up to 3 times daily 1 each 0   Continuous Blood Gluc Sensor (DEXCOM G7 SENSOR) MISC Use to check blood sugars up to 3 times daily. Replace sensor every 10 days 3 each 4   diltiazem (TIADYLT ER) 300 MG 24 hr capsule TAKE 1 CAPSULE BY MOUTH EVERY DAY 90 capsule 0   donepezil (ARICEPT) 5 MG  tablet Take 1 tablet (5 mg total) by mouth at bedtime. 30 tablet 3   empagliflozin (JARDIANCE) 10 MG TABS tablet Take 1 tablet (10 mg total) by mouth daily. 30 tablet 2   glipiZIDE (GLUCOTROL XL) 2.5 MG 24 hr tablet Take 1 tablet (2.5 mg total) by mouth 2 (two) times daily. 180 tablet 1   glucose blood (ONETOUCH VERIO) test strip USE TO TEST BLOOD SUGAR 3 TIMES DAILY 100 each 0   JANUVIA 100 MG tablet Take 1 tablet (100 mg total) by mouth daily. 90 tablet 0   Potassium 99 MG TABS Take by mouth.      Vitamin D, Ergocalciferol, (DRISDOL) 1.25 MG (50000 UNIT) CAPS capsule Take 1 capsule (50,000 Units total) by mouth every 7 (seven) days. 4 capsule 1   No current facility-administered medications on file prior to visit.    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)  Tobacco Use   Smoking status: Never    Passive exposure: Never   Smokeless tobacco: Never  Vaping Use   Vaping status: Never Used  Substance and Sexual Activity   Alcohol use: No   Drug use: No   Sexual activity: Not Currently   Family History  Problem Relation Age of Onset   Hypertension Mother    Diabetes Mother    Hypertension Father    Diabetes Father    Hypertension Daughter    Hypertension Sister    Diabetes Sister    Coronary artery disease Neg Hx    Stroke Neg Hx    Colon cancer Neg Hx    Breast cancer Neg Hx    Immunization History  Administered Date(s) Administered   Influenza Split 08/01/2012   Influenza Whole 10/14/2009   Influenza, High Dose Seasonal PF 09/12/2013, 08/19/2015, 09/14/2018   Influenza,inj,Quad PF,6+ Mos 09/24/2014   Influenza,inj,quad, With Preservative 08/15/2020   Influenza-Unspecified 10/01/2019   PFIZER(Purple Top)SARS-COV-2 Vaccination 12/11/2019, 01/01/2020   Pneumococcal Conjugate-13 01/15/2014   Pneumococcal Polysaccharide-23 03/31/2012   Td 08/14/2010     Review of Systems: Negative except as noted in the HPI.   Objective: There were no vitals filed for this visit.  [name] [name] is a pleasant 77 y.o. female in NAD. AAO X 3.  Vascular Examination: CFT <3 seconds b/l. DP/PT pulses  faintly palpable b/l. Skin temperature gradient warm to warm b/l. No pain with calf compression. No ischemia or gangrene. No cyanosis or clubbing noted b/l.    Neurological Examination: Sensation grossly intact b/l with 10 gram monofilament. Vibratory sensation intact b/l.   Dermatological Examination: Pedal skin warm and supple b/l.   No open wounds. No interdigital macerations.  Toenails 1-5 b/l thick, discolored, elongated with subungual debris and pain on dorsal palpation.    No corns, calluses nor porokeratotic lesions noted.  Musculoskeletal Examination: Normal muscle strength 5/5 to all lower extremity muscle groups bilaterally. No pain, crepitus or joint limitation noted with ROM b/l LE. HAV with bunion deformity noted b/l LE.Marland Kitchen Patient ambulates with cane assistance.  Radiographs: None  Last A1c:  Latest Ref Rng & Units 05/25/2023    3:09 PM 01/13/2023   10:13 AM  Hemoglobin A1C  Hemoglobin-A1c 4.6 - 6.5 % 8.7  8.9     Lab Results  Component Value Date   HGBA1C 8.7 (H) 05/25/2023   ADA Risk Categorization: Low Risk :  Patient has all of the following: Intact protective sensation No prior foot ulcer  No severe deformity Pedal pulses present  Assessment: 1. Pain due to onychomycosis of toenails of both feet   2. Hallux valgus, acquired, bilateral   3. Pes planus of both feet   4. Type 2 diabetes mellitus without complication, without long-term current use of insulin (HCC)   5. Encounter for diabetic foot exam [facility])     Plan: -Patient's family member present. All questions/concerns addressed on today's visit. -Diabetic foot examination performed today. -Continue diabetic foot care principles: inspect feet daily, monitor glucose as recommended by PCP and/or Endocrinologist, and follow prescribed diet per PCP, Endocrinologist and/or dietician. -Patient to continue soft, supportive shoe gear daily. -Toenails 1-5 b/l were debrided in length and girth with  sterile nail nippers and dremel without iatrogenic bleeding.  -Patient/POA to call should there be question/concern in the interim. Return in about 3 months (around 10/13/2023).  Freddie Breech, DPM

## 2023-08-03 ENCOUNTER — Telehealth: Payer: Self-pay | Admitting: Family Medicine

## 2023-08-03 MED ORDER — JANUVIA 100 MG PO TABS: 100.0000 mg | ORAL_TABLET | Freq: Every day | ORAL | 0 refills | Status: DC

## 2023-08-03 NOTE — Telephone Encounter (Signed)
  Prescription Request  08/03/2023  LOV: 06/30/2023  What is the name of the medication or equipment? JANUVIA 100 MG tablet   Have you contacted your pharmacy to request a refill? No   Which pharmacy would you like this sent to?    [id number] [place], [place] - [address] [place] [address] Phone: [phone] Fax: [phone]   Patient notified that their request is being sent to the clinical staff for review and that they should receive a response within 2 business days.   Please advise at Mobile [phone] (mobile)

## 2023-08-03 NOTE — Telephone Encounter (Signed)
Rx sent 

## 2023-08-17 ENCOUNTER — Telehealth: Payer: Self-pay | Admitting: Family Medicine

## 2023-08-17 NOTE — Telephone Encounter (Signed)
A representative from [employer] called and said patient was unsure if she was supposed to be taking pravastatin. She used to be on it and has not been taking it. They said they would like to know if PCP thinks statin therapy would be beneficial to the patient. Best callback is [phone].

## 2023-08-17 NOTE — Telephone Encounter (Signed)
Pt insurance called and states pt used to be on pravastatin and if you think pt should be back on it? Please advise.  Last lipid panel 01/13/2023 Elevated levels from panel below: Cholesterol 202 LDL cholesterol 111

## 2023-08-18 NOTE — Telephone Encounter (Signed)
LM for representative with PCP response. Provided office number for any further questions

## 2023-10-18 ENCOUNTER — Ambulatory Visit: Payer: Medicare Other | Admitting: Podiatry

## 2023-10-29 ENCOUNTER — Other Ambulatory Visit: Payer: Self-pay | Admitting: Family Medicine

## 2023-10-29 DIAGNOSIS — I10 Essential (primary) hypertension: Secondary | ICD-10-CM

## 2023-11-17 ENCOUNTER — Ambulatory Visit: Payer: Medicare Other | Admitting: Family Medicine

## 2023-11-22 ENCOUNTER — Ambulatory Visit: Payer: Medicare Other | Admitting: Family Medicine

## 2023-12-09 ENCOUNTER — Other Ambulatory Visit: Payer: Self-pay | Admitting: Family Medicine

## 2023-12-09 DIAGNOSIS — E1165 Type 2 diabetes mellitus with hyperglycemia: Secondary | ICD-10-CM

## 2023-12-15 ENCOUNTER — Other Ambulatory Visit: Payer: Self-pay

## 2023-12-15 MED ORDER — JANUVIA 100 MG PO TABS: 100.0000 mg | ORAL_TABLET | Freq: Every day | ORAL | 0 refills | Status: DC

## 2023-12-26 ENCOUNTER — Encounter: Payer: Medicare Other | Attending: Psychology | Admitting: Psychology

## 2024-01-17 ENCOUNTER — Ambulatory Visit (INDEPENDENT_AMBULATORY_CARE_PROVIDER_SITE_OTHER): Payer: Medicare Other | Admitting: Podiatry

## 2024-01-17 DIAGNOSIS — Z91199 Patient's noncompliance with other medical treatment and regimen due to unspecified reason: Secondary | ICD-10-CM

## 2024-01-17 NOTE — Progress Notes (Signed)
 1. No-show for appointment

## 2024-02-15 ENCOUNTER — Ambulatory Visit (INDEPENDENT_AMBULATORY_CARE_PROVIDER_SITE_OTHER): Admitting: Podiatry

## 2024-02-15 ENCOUNTER — Encounter: Payer: Self-pay | Admitting: Podiatry

## 2024-02-15 VITALS — Ht 64.0 in | Wt 148.0 lb

## 2024-02-15 DIAGNOSIS — M79675 Pain in left toe(s): Secondary | ICD-10-CM | POA: Diagnosis not present

## 2024-02-15 DIAGNOSIS — B351 Tinea unguium: Secondary | ICD-10-CM

## 2024-02-15 DIAGNOSIS — M79674 Pain in right toe(s): Secondary | ICD-10-CM | POA: Diagnosis not present

## 2024-02-17 ENCOUNTER — Ambulatory Visit: Payer: Medicare Other

## 2024-02-20 NOTE — Progress Notes (Signed)
  Subjective:  Patient ID: [name] [name], female    DOB: [birth year],  MRN: [id number]  78 y.o. female presents with preventative diabetic foot care and painful elongated mycotic toenails 1-5 bilaterally which are tender when wearing enclosed shoe gear. Pain is relieved with periodic professional debridement. Chief Complaint  Patient presents with   [facility]    She is here for  a nail trim, PCP is Vickie henson and seen when needed"     PCP: Suezanne Jacquet, Vickie L, NP-C.  New problem(s): None.   Review of Systems: Negative except as noted in the HPI.   Allergies  Allergen Reactions   Colchicine Nausea Only   Hydrocodone Other (See Comments)   Sulindac Other (See Comments)   Tramadol Other (See Comments)    "Made me feel like I was in another world."   Celecoxib Rash   Glimepiride Rash    Objective:  There were no vitals filed for this visit. Constitutional Patient is a pleasant 78 y.o. female WD, WN in NAD. AAO x 3.  Vascular Capillary fill time to digits <3 seconds.  DP/PT pulse(s) are faintly palpable b/l lower extremities. Pedal hair absent b/l. Lower extremity skin temperature gradient warm to cool b/l. No pain with calf compression b/l. No cyanosis or clubbing noted. No ischemia nor gangrene noted b/l.   Neurologic Protective sensation intact 5/5 intact bilaterally with 10g monofilament b/l. Vibratory sensation intact b/l. No clonus b/l.   Dermatologic Pedal skin is thin, shiny and atrophic b/l.  No open wounds b/l lower extremities. No interdigital macerations b/l lower extremities. Toenails 1-5 b/l elongated, discolored, dystrophic, thickened, crumbly with subungual debris and tenderness to dorsal palpation. No corns, calluses nor porokeratotic lesions noted.  Orthopedic: Normal muscle strength 5/5 to all lower extremity muscle groups bilaterally. HAV with bunion deformity noted b/l LE.   Last HgA1c:     Latest Ref Rng & Units 05/25/2023    3:09 PM  Hemoglobin A1C  Hemoglobin-A1c  4.6 - 6.5 % 8.7      Assessment:   1. Pain due to onychomycosis of toenails of both feet    Plan:  Patient was evaluated and treated. All patient's and/or POA's questions/concerns addressed on today's visit. Mycotic toenails 1-5 debrided in length and girth without incident.  Continue daily foot inspections and monitor blood glucose per PCP/Endocrinologist's recommendations.Continue soft, supportive shoe gear daily. Report any pedal injuries to medical professional. Call office if there are any quesitons/concerns. -Patient/POA to call should there be question/concern in the interim.  Return in about 3 months (around 05/16/2024).  Freddie Breech, DPM      [facility] LOCATION: [address], [place] [address]                   Office [phone]   [facility] LOCATION: [address] [place], [place] [address] Office [phone]

## 2024-03-12 ENCOUNTER — Other Ambulatory Visit: Payer: Self-pay | Admitting: Family Medicine

## 2024-03-12 DIAGNOSIS — E1165 Type 2 diabetes mellitus with hyperglycemia: Secondary | ICD-10-CM

## 2024-03-13 ENCOUNTER — Ambulatory Visit (INDEPENDENT_AMBULATORY_CARE_PROVIDER_SITE_OTHER)

## 2024-03-13 VITALS — Ht 64.0 in | Wt 148.0 lb

## 2024-03-13 DIAGNOSIS — K635 Polyp of colon: Secondary | ICD-10-CM | POA: Diagnosis not present

## 2024-03-13 DIAGNOSIS — Z1231 Encounter for screening mammogram for malignant neoplasm of breast: Secondary | ICD-10-CM | POA: Diagnosis not present

## 2024-03-13 DIAGNOSIS — Z0000 Encounter for general adult medical examination without abnormal findings: Secondary | ICD-10-CM | POA: Diagnosis not present

## 2024-03-13 DIAGNOSIS — Z780 Asymptomatic menopausal state: Secondary | ICD-10-CM

## 2024-03-13 DIAGNOSIS — Z1211 Encounter for screening for malignant neoplasm of colon: Secondary | ICD-10-CM

## 2024-03-13 NOTE — Progress Notes (Signed)
 Subjective:   [name] [name] is a 78 y.o. who presents for a Medicare Wellness preventive visit.  Visit Complete: Virtual I connected with  [name] [name] on 03/13/24 by a audio enabled telemedicine application and verified that I am speaking with the correct person using two identifiers.  Patient Location: Home  Provider Location: Office/Clinic  I discussed the limitations of evaluation and management by telemedicine. The patient expressed understanding and agreed to proceed.  Vital Signs: Because this visit was a virtual/telehealth visit, some criteria may be missing or patient reported. Any vitals not documented were not able to be obtained and vitals that have been documented are patient reported.  VideoDeclined- This patient declined Librarian, academic. Therefore the visit was completed with audio only.  Persons Participating in Visit: Patient.  AWV Questionnaire: No: Patient Medicare AWV questionnaire was not completed prior to this visit.  Cardiac Risk Factors include: advanced age (>1men, >32 women);diabetes mellitus;dyslipidemia;hypertension     Objective:    Today's Vitals   03/13/24 1445  Weight: 148 lb (67.1 kg)  Height: 5\' 4"  (1.626 m)   Body mass index is 25.4 kg/m.     03/13/2024    2:44 PM 03/14/2023    4:30 PM 09/14/2018   10:34 AM 07/22/2017    1:35 PM 06/15/2016    1:20 PM  Advanced Directives  Does Patient Have a Medical Advance Directive? Yes Yes Yes No No  Type of Estate agent of [place];Living will Healthcare Power of [place];Living will Living will    Does patient want to make changes to medical advance directive?   No - Patient declined    Copy of Healthcare Power of Attorney in Chart? No - copy requested No - copy requested     Would patient like information on creating a medical advance directive?    Yes (MAU/Ambulatory/Procedural Areas - Information given) Yes - Educational materials given     Current Medications (verified) Outpatient Encounter Medications as of 03/13/2024  Medication Sig   aspirin 81 MG tablet Take 81 mg by mouth daily.   Continuous Blood Gluc Receiver (DEXCOM G7 RECEIVER) DEVI Use to check Blood sugars up to 3 times daily   Continuous Blood Gluc Sensor (DEXCOM G7 SENSOR) MISC Use to check blood sugars up to 3 times daily. Replace sensor every 10 days   donepezil  (ARICEPT ) 5 MG tablet Take 1 tablet (5 mg total) by mouth at bedtime.   empagliflozin  (JARDIANCE ) 10 MG TABS tablet Take 1 tablet (10 mg total) by mouth daily.   glipiZIDE  (GLUCOTROL  XL) 2.5 MG 24 hr tablet TAKE 1 TABLET BY MOUTH 2 TIMES DAILY. PLEASE SCHEDULE OFFICE VISIT FOR FURTHER REFILLS.   glucose blood (ONETOUCH VERIO) test strip USE TO TEST BLOOD SUGAR 3 TIMES DAILY   JANUVIA  100 MG tablet Take 1 tablet (100 mg total) by mouth daily. Please schedule appointment for further refills.   Potassium 99 MG TABS Take by mouth.   TIADYLT  ER 300 MG 24 hr capsule TAKE 1 CAPSULE BY MOUTH EVERY DAY   Vitamin D , Ergocalciferol , (DRISDOL ) 1.25 MG (50000 UNIT) CAPS capsule Take 1 capsule (50,000 Units total) by mouth every 7 (seven) days.   No facility-administered encounter medications on file as of 03/13/2024.    Allergies (verified) Colchicine , Hydrocodone, Sulindac, Tramadol, Celecoxib, and Glimepiride    History: Past Medical History:  Diagnosis Date   Bilateral knee pain    Euflexxa x3   Chest pain 06/2007   CP and  QW on EKG: neg stress test    Diabetes mellitus    w/ neuropathy   Diverticulitis 2000   Gout    Hyperlipidemia    Hypertension    Insomnia 02/20/2013   Osteoarthritis    gout, CPPD--- sees Dr.Davenshwar   Osteoarthritis of both feet 09/14/2016   Osteoarthritis of both hands 09/14/2016   Osteoarthritis of both knees 09/14/2016   Osteopenia    per DEXA 4/10   Recurrent boils    h/o MRSA   Type 2 diabetes mellitus without complication (HCC) 12/17/2020   Past Surgical History:   Procedure Laterality Date   CHOLECYSTECTOMY     TONSILLECTOMY     Family History  Problem Relation Age of Onset   Hypertension Mother    Diabetes Mother    Hypertension Father    Diabetes Father    Hypertension Daughter    Hypertension Sister    Diabetes Sister    Coronary artery disease Neg Hx    Stroke Neg Hx    Colon cancer Neg Hx    Breast cancer Neg Hx    Social History   Socioeconomic History   Marital status: Married    Spouse name: herbert   Number of children: 3   Years of education: college   Highest education level: Not on file  Occupational History   Occupation: Retired- 2009, doing part time work sometimes     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   Patient is right handed.   Patient has college education.   Patient drinks 3-4 daily.   Social Drivers of Corporate investment banker Strain: Low Risk  (03/13/2024)   Overall Financial Resource Strain (CARDIA)    Difficulty of Paying Living Expenses: Not hard at all  Food Insecurity: No Food Insecurity (03/13/2024)   Hunger Vital Sign    Worried About Running Out of Food in the Last Year: Never true    Ran Out of Food in the Last Year: Never true  Transportation Needs: No Transportation Needs (03/13/2024)   PRAPARE - Administrator, Civil Service (Medical): No    Lack of Transportation (Non-Medical): No  Physical Activity: Insufficiently Active (03/13/2024)   Exercise Vital Sign    Days of Exercise per Week: 2 days    Minutes of Exercise per Session: 30 min  Stress: No Stress Concern Present (03/13/2024)    [employer] of Occupational Health - Occupational Stress Questionnaire    Feeling of Stress : Not at all  Social Connections: Socially Integrated (03/13/2024)   Social Connection and Isolation Panel [NHANES]    Frequency of Communication with Friends and Family: More than three times a week    Frequency of Social Gatherings with Friends and Family: More than three times a week    Attends Religious Services: More than 4 times per year    Active Member of [employer] or Organizations: Yes    Attends Banker Meetings: More than 4 times per year    Marital  Status: Married    Tobacco Counseling Counseling given: No    Clinical Intake:  Pre-visit preparation completed: Yes  Pain : No/denies pain     BMI - recorded: 25.4 Nutritional Risks: None Diabetes: Yes CBG done?: No CBG resulted in Enter/ Edit results?: No Did pt. bring in CBG monitor from home?: No  Lab Results  Component Value Date   HGBA1C 8.7 (H) 05/25/2023   HGBA1C 8.9 (H) 01/13/2023   HGBA1C 7.0 (H) 11/16/2018     How often do you need to have someone help you when you read instructions, pamphlets, or other written materials from your doctor or pharmacy?: 1 - Never  Interpreter Needed?: No  Information entered by :: Kandy Orris, CMA   Activities of Daily Living     03/13/2024    2:48 PM  In your present state of health, do you have any difficulty performing the following activities:  Hearing? 0  Vision? 0  Difficulty concentrating or making decisions? 0  Walking or climbing stairs? 0  Dressing or bathing? 0  Doing errands, shopping? 0  Preparing Food and eating ? N  Using the Toilet? N  In the past six months, have you accidently leaked urine? N  Do you have problems with loss of bowel control? N  Managing your Medications? N  Managing your Finances? N  Housekeeping or managing your Housekeeping? N    Patient Care Team: Abram Abraham, NP-C as PCP - General (Family  Medicine) Lisabeth Rider, MD as Consulting Physician (Neurology) Lattie Poli, [place] as Physician Assistant (Orthopedic Surgery) Nicholas Bari, MD as Consulting Physician (Rheumatology) Devin Foerster, MD as Consulting Physician (Ophthalmology)  Indicate any recent Medical Services you may have received from other than [facility] providers in the past year (date may be approximate).     Assessment:   This is a routine wellness examination for Estefanie.  Hearing/Vision screen Hearing Screening - Comments:: Denies hearing difficulties   Vision Screening - Comments:: Wears rx glasses - up to date with routine eye exams with Dr Hope Ly   Goals Addressed               This Visit's Progress     Patient Stated (pt-stated)        Patient stated she stays busy and active.         Depression Screen     03/13/2024    2:54 PM 06/30/2023    3:46 PM 03/14/2023    4:29 PM 01/13/2023    9:14 AM 11/16/2018   10:14 AM 10/17/2018   12:14 PM 09/14/2018   10:34 AM  PHQ 2/9 Scores  PHQ - 2 Score 0 0 0 0 0 0 0  PHQ- 9 Score 0          Fall Risk     03/13/2024    2:49 PM 05/25/2023    2:30 PM 03/14/2023    4:31 PM 01/13/2023    9:14 AM 11/16/2018   10:14 AM  Fall Risk   Falls in the past year? 0 0 0 0 0  Number falls in past yr: 0 0 0 0   Injury with Fall? 0 0 0 0   Risk for fall due to : No Fall Risks  No Fall Risks No Fall Risks   Follow up Falls prevention discussed;Falls evaluation completed Falls evaluation completed Falls prevention discussed Falls evaluation completed     MEDICARE RISK AT HOME:  Medicare Risk at Home Any stairs in  or around the home?: Yes (outside) If so, are there any without handrails?: No Home free of loose throw rugs in walkways, pet beds, electrical cords, etc?: Yes Adequate lighting in your home to reduce risk of falls?: Yes Life alert?: No Use of a cane, walker or w/c?: Yes (cane/walker) Grab bars in the bathroom?: No Shower chair or bench in  shower?: Yes Elevated toilet seat or a handicapped toilet?: Yes  TIMED UP AND GO:  Was the test performed?  No  Cognitive Function: 6CIT completed    06/15/2016    1:21 PM  MMSE - Mini Mental State Exam  Orientation to time 5  Orientation to Place 5  Registration 3  Attention/ Calculation 5  Recall 1  Language- name 2 objects 2  Language- repeat 1  Language- follow 3 step command 3  Language- read & follow direction 1  Write a sentence 1  Copy design 1  Total score 28      02/16/2023    3:15 PM  Montreal Cognitive Assessment   Visuospatial/ Executive (0/5) 3  Naming (0/3) 2  Attention: Read list of digits (0/2) 2  Attention: Read list of letters (0/1) 1  Attention: Serial 7 subtraction starting at 100 (0/3) 2  Language: Repeat phrase (0/2) 2  Language : Fluency (0/1) 1  Abstraction (0/2) 2  Delayed Recall (0/5) 0  Orientation (0/6) 2  Total 17      03/13/2024    2:56 PM 01/13/2023    9:14 AM 09/14/2018   10:36 AM  6CIT Screen  What Year? 0 points 4 points 0 points  What month? 0 points 0 points 0 points  What time? 0 points 3 points 3 points  Count back from 20 0 points 0 points 0 points  Months in reverse 0 points 2 points 2 points  Repeat phrase 0 points 4 points 2 points  Total Score 0 points 13 points 7 points    Immunizations Immunization History  Administered Date(s) Administered   Influenza Split 08/01/2012   Influenza Whole 10/14/2009   Influenza, High Dose Seasonal PF 09/12/2013, 08/19/2015, 09/14/2018   Influenza,inj,Quad PF,6+ Mos 09/24/2014   Influenza,inj,quad, With Preservative 08/15/2020   Influenza-Unspecified 10/01/2019   PFIZER(Purple Top)SARS-COV-2 Vaccination 12/11/2019, 01/01/2020   Pneumococcal Conjugate-13 01/15/2014   Pneumococcal Polysaccharide-23 03/31/2012   Td 08/14/2010    Screening Tests Health Maintenance  Topic Date Due   MAMMOGRAM  10/05/2016   Pneumonia Vaccine 2+ Years old (3 of 3 - PPSV23, PCV20 or PCV21)  03/31/2017   OPHTHALMOLOGY EXAM  11/01/2017   DEXA SCAN  08/06/2018   Colonoscopy  12/15/2020   HEMOGLOBIN A1C  11/25/2023   Diabetic kidney evaluation - Urine ACR  01/13/2024   Diabetic kidney evaluation - eGFR measurement  05/24/2024   INFLUENZA VACCINE  06/15/2024   FOOT EXAM  07/12/2024   Medicare Annual Wellness (AWV)  03/13/2025   Hepatitis C Screening  Completed   HPV VACCINES  Aged Out   Meningococcal B Vaccine  Aged Out   DTaP/Tdap/Td  Discontinued   COVID-19 Vaccine  Discontinued   Zoster Vaccines- Shingrix  Discontinued    Health Maintenance  Health Maintenance Due  Topic Date Due   MAMMOGRAM  10/05/2016   Pneumonia Vaccine 64+ Years old (3 of 3 - PPSV23, PCV20 or PCV21) 03/31/2017   OPHTHALMOLOGY EXAM  11/01/2017   DEXA SCAN  08/06/2018   Colonoscopy  12/15/2020   HEMOGLOBIN A1C  11/25/2023   Diabetic kidney evaluation - Urine  ACR  01/13/2024   Health Maintenance Items Addressed:  Mammogram ordered, DEXA ordered, Referral sent to GI for colonoscopy  Additional Screening:  Vision Screening: Recommended annual ophthalmology exams for early detection of glaucoma and other disorders of the eye.  Dental Screening: Recommended annual dental exams for proper oral hygiene  Community Resource Referral / Chronic Care Management: CRR required this visit?  No   CCM required this visit?  No     Plan:     I have personally reviewed and noted the following in the patient's chart:   Medical and social history Use of alcohol, tobacco or illicit drugs  Current medications and supplements including opioid prescriptions. Patient is not currently taking opioid prescriptions. Functional ability and status Nutritional status Physical activity Advanced directives List of other physicians Hospitalizations, surgeries, and ER visits in previous 12 months Vitals Screenings to include cognitive, depression, and falls Referrals and appointments  In addition, I have  reviewed and discussed with patient certain preventive protocols, quality metrics, and best practice recommendations. A written personalized care plan for preventive services as well as general preventive health recommendations were provided to patient.     Patria Bookbinder, CMA   03/13/2024   After Visit Summary: (Mail) Due to this being a telephonic visit, the after visit summary with patients personalized plan was offered to patient via mail   Notes: Nothing significant to report at this time.

## 2024-03-13 NOTE — Patient Instructions (Signed)
 Ms. Jacobowitz , Thank you for taking time to come for your Medicare Wellness Visit. I appreciate your ongoing commitment to your health goals. Please review the following plan we discussed and let me know if I can assist you in the future.   Referrals/Orders/Follow-Ups/Clinician Recommendations: Aim for 30 minutes of exercise or brisk walking, 6-8 glasses of water, and 5 servings of fruits and vegetables each day. Ordered a Mammogram and a DEXA scan for 2025.  Referred to [facility] for a repeat Colonoscopy.  This is a list of the screening recommended for you and due dates:  Health Maintenance  Topic Date Due   Mammogram  10/05/2016   Pneumonia Vaccine (3 of 3 - PPSV23, PCV20 or PCV21) 03/31/2017   Eye exam for diabetics  11/01/2017   DEXA scan (bone density measurement)  08/06/2018   Colon Cancer Screening  12/15/2020   Hemoglobin A1C  11/25/2023   Yearly kidney health urinalysis for diabetes  01/13/2024   Yearly kidney function blood test for diabetes  05/24/2024   Flu Shot  06/15/2024   Complete foot exam   07/12/2024   Medicare Annual Wellness Visit  03/13/2025   Hepatitis C Screening  Completed   HPV Vaccine  Aged Out   Meningitis B Vaccine  Aged Out   DTaP/Tdap/Td vaccine  Discontinued   COVID-19 Vaccine  Discontinued   Zoster (Shingles) Vaccine  Discontinued    Advanced directives: (Copy Requested) Please bring a copy of your health care power of attorney and living will to the office to be added to your chart at your convenience. You can mail to [facility] [address] [place], [place] [address] or email to [email or web address]  Next Medicare Annual Wellness Visit scheduled for next year: Yes

## 2024-03-20 ENCOUNTER — Ambulatory Visit: Admitting: Family Medicine

## 2024-03-29 ENCOUNTER — Ambulatory Visit: Admitting: Family Medicine

## 2024-04-23 ENCOUNTER — Other Ambulatory Visit: Payer: Self-pay | Admitting: Family Medicine

## 2024-04-23 DIAGNOSIS — E559 Vitamin D deficiency, unspecified: Secondary | ICD-10-CM

## 2024-04-30 ENCOUNTER — Other Ambulatory Visit: Payer: Self-pay | Admitting: Family Medicine

## 2024-04-30 DIAGNOSIS — I10 Essential (primary) hypertension: Secondary | ICD-10-CM

## 2024-05-08 ENCOUNTER — Encounter: Payer: Self-pay | Admitting: Internal Medicine

## 2024-05-08 ENCOUNTER — Ambulatory Visit (INDEPENDENT_AMBULATORY_CARE_PROVIDER_SITE_OTHER): Admitting: Internal Medicine

## 2024-05-08 VITALS — BP 128/74 | HR 68 | Temp 98.3°F | Ht 64.0 in | Wt 146.0 lb

## 2024-05-08 DIAGNOSIS — M89319 Hypertrophy of bone, unspecified shoulder: Secondary | ICD-10-CM

## 2024-05-08 NOTE — Progress Notes (Signed)
 Subjective:    Patient ID: [name] [name], female    DOB: 08-31-1946, 78 y.o.   MRN: [id number]      HPI [name] [name] is here for  Chief Complaint  Patient presents with   Mass    Lump noted around collar bone (towards left side) Noticed last week    Lump left proximal collar bone -she noticed it last week.  Feels like it bigger.   She denies any pain, but per her daughter she did state some pain at 1 point.  She does palpate it frequently.  Her daughter states she always sleeps on her left side and often will wake up on that side when she is sitting in her chair.  No injuries.  Has arthritis everywhere.     Medications and allergies reviewed with patient and updated if appropriate.  Current Outpatient Medications on File Prior to Visit  Medication Sig Dispense Refill   aspirin 81 MG tablet Take 81 mg by mouth daily.     Continuous Blood Gluc Receiver (DEXCOM G7 RECEIVER) DEVI Use to check Blood sugars up to 3 times daily 1 each 0   Continuous Blood Gluc Sensor (DEXCOM G7 SENSOR) MISC Use to check blood sugars up to 3 times daily. Replace sensor every 10 days 3 each 4   diltiazem  (TIADYLT  ER) 300 MG 24 hr capsule TAKE 1 CAPSULE BY MOUTH EVERY DAY. NEEDS OFFICE VISIT FOR REFILLS. 30 capsule 0   donepezil  (ARICEPT ) 5 MG tablet Take 1 tablet (5 mg total) by mouth at bedtime. 30 tablet 3   empagliflozin  (JARDIANCE ) 10 MG TABS tablet Take 1 tablet (10 mg total) by mouth daily. 30 tablet 2   glipiZIDE  (GLUCOTROL  XL) 2.5 MG 24 hr tablet TAKE 1 TABLET BY MOUTH 2 TIMES DAILY. PLEASE SCHEDULE OFFICE VISIT FOR FURTHER REFILLS. 60 tablet 0   glucose blood (ONETOUCH VERIO) test strip USE TO TEST BLOOD SUGAR 3 TIMES DAILY 100 each 0   JANUVIA  100 MG tablet Take 1 tablet (100 mg total) by mouth daily. Please schedule appointment for further refills. 90 tablet 0   Potassium 99 MG TABS Take by mouth.     Vitamin D , Ergocalciferol , (DRISDOL ) 1.25 MG (50000 UNIT) CAPS capsule Take 1 capsule (50,000  Units total) by mouth every 7 (seven) days. 4 capsule 1   No current facility-administered medications on file prior to visit.    Review of Systems     Objective:   Vitals:   05/08/24 1615  BP: 128/74  Pulse: 68  Temp: 98.3 F (36.8 C)  SpO2: 97%   BP Readings from Last 3 Encounters:  05/08/24 128/74  06/30/23 132/82  05/25/23 (!) 140/80   Wt Readings from Last 3 Encounters:  05/08/24 146 lb (66.2 kg)  03/13/24 148 lb (67.1 kg)  02/15/24 148 lb (67.1 kg)   Body mass index is 25.06 kg/m.    Physical Exam Constitutional:      General: She is not in acute distress.    Appearance: Normal appearance. She is not ill-appearing.  HENT:     Head: Normocephalic and atraumatic.  Neck:     Comments: No thyromegaly or obvious thyroid  nodules Musculoskeletal:     Cervical back: Neck supple. No tenderness.     Comments: Left medial clavicle at sternum is slightly more prominent than the right.  There is little discomfort with palpation, but she also states discomfort when I palpate the right medial clavicle.  No deformity.  No tenderness  along clavicle to shoulder.  Lymphadenopathy:     Cervical: No cervical adenopathy.   Skin:    General: Skin is warm and dry.     Findings: Erythema (Slight erythema overlying the left medial clavicle which is from the patient palpating frequently) present. No bruising, lesion or rash.   Neurological:     Mental Status: She is alert.            Assessment & Plan:    Left medial clavicle enlargement: New She noticed this last week No injury or accident Tender at 1 point, but denies any pain Left medial clavicle minimally more prominent than right medial clavicle Likely related to arthritis She tends to always sleep and leaning on her left side which could be aggravating and causing some arthritis Reassured Advised her to monitor Symptomatic treatment

## 2024-05-08 NOTE — Patient Instructions (Signed)
      Your clavicle on the left is a little larger which is likely related to arthritis - it is nothing to worry about.

## 2024-05-12 ENCOUNTER — Other Ambulatory Visit: Payer: Self-pay | Admitting: Family Medicine

## 2024-05-12 DIAGNOSIS — I10 Essential (primary) hypertension: Secondary | ICD-10-CM

## 2024-05-22 ENCOUNTER — Other Ambulatory Visit: Payer: Self-pay | Admitting: Family Medicine

## 2024-05-22 DIAGNOSIS — E1165 Type 2 diabetes mellitus with hyperglycemia: Secondary | ICD-10-CM

## 2024-06-05 ENCOUNTER — Ambulatory Visit (INDEPENDENT_AMBULATORY_CARE_PROVIDER_SITE_OTHER): Admitting: Podiatry

## 2024-06-05 ENCOUNTER — Encounter: Payer: Self-pay | Admitting: Podiatry

## 2024-06-05 DIAGNOSIS — M79674 Pain in right toe(s): Secondary | ICD-10-CM

## 2024-06-05 DIAGNOSIS — E119 Type 2 diabetes mellitus without complications: Secondary | ICD-10-CM | POA: Diagnosis not present

## 2024-06-05 DIAGNOSIS — M79675 Pain in left toe(s): Secondary | ICD-10-CM

## 2024-06-05 DIAGNOSIS — B351 Tinea unguium: Secondary | ICD-10-CM | POA: Diagnosis not present

## 2024-06-05 DIAGNOSIS — Z91198 Patient's noncompliance with other medical treatment and regimen for other reason: Secondary | ICD-10-CM

## 2024-06-05 NOTE — Progress Notes (Signed)
 1. Failure to attend appointment with reason given    Patient rescheduled to later in the day today.

## 2024-06-10 NOTE — Progress Notes (Signed)
  Subjective:  Patient ID: [name] [name], female    DOB: Aug 01, 1946,  MRN: [id number]  [name] [name] presents to clinic today for preventative diabetic foot care and painful thick toenails that are difficult to trim. Pain interferes with ambulation. Aggravating factors include wearing enclosed shoe gear. Pain is relieved with periodic professional debridement.  Chief Complaint  Patient presents with   Diabetes    DFC NIDDM A1C 8.7. Toenail trim. LOV with PCP 06/30/23.   New problem(s): None.   PCP is Henson, Vickie L, NP-C.  Allergies  Allergen Reactions   Colchicine  Nausea Only   Hydrocodone Other (See Comments)   Sulindac Other (See Comments)   Tramadol Other (See Comments)    Made me feel like I was in another world.   Celecoxib Rash   Glimepiride  Rash    Review of Systems: Negative except as noted in the HPI.  Objective: No changes noted in today's physical examination. There were no vitals filed for this visit. [name] [name] is a pleasant 78 y.o. female WD, WN in NAD. AAO x 3.  Vascular Examination: CFT <3 seconds b/l. DP/PT pulses faintly palpable b/l. Pedal edema absent. Skin temperature gradient warm to warm b/l. Digital hair absent. No pain with calf compression. No ischemia or gangrene. No cyanosis or clubbing noted b/l.    Neurological Examination: Sensation grossly intact b/l with 10 gram monofilament. Vibratory sensation intact b/l.   Dermatological Examination: No open wounds. No interdigital macerations.  Toenails 1-5 b/l thick, discolored, elongated with subungual debris and pain on dorsal palpation.    Pedal skin thin and atrophic b/l LE. No corns, calluses nor porokeratotic lesions noted.  Musculoskeletal Examination: Muscle strength 5/5 to all lower extremity muscle groups bilaterally. HAV with bunion deformity noted b/l LE.  Radiographs: None  Assessment/Plan: 1. Pain due to onychomycosis of toenails of both feet   2. Type 2 diabetes  mellitus without complication, without long-term current use of insulin  [facility])     Consent given for treatment. Patient examined. All patient's and/or POA's questions/concerns addressed on today's visit. Toenails 1-5 debrided in length and girth without incident. Continue foot and shoe inspections daily. Monitor blood glucose per PCP/Endocrinologist's recommendations. Continue soft, supportive shoe gear daily. Report any pedal injuries to medical professional. Call office if there are any questions/concerns. -Patient/POA to call should there be question/concern in the interim.   Return in about 3 months (around 09/05/2024).  [name] [name], DPM       LOCATION: [address], [place] [address]                   Office [phone]   [facility] LOCATION: [address] [place], [place] [address] Office [phone]

## 2024-06-11 LAB — LAB REPORT - SCANNED: Albumin/Creatinine Ratio, Urine, POC: <30

## 2024-06-25 ENCOUNTER — Inpatient Hospital Stay: Admission: RE | Admit: 2024-06-25 | Source: Ambulatory Visit

## 2024-07-09 ENCOUNTER — Other Ambulatory Visit: Payer: Self-pay | Admitting: Family Medicine

## 2024-08-08 ENCOUNTER — Other Ambulatory Visit: Payer: Self-pay | Admitting: Family Medicine

## 2024-08-08 DIAGNOSIS — I10 Essential (primary) hypertension: Secondary | ICD-10-CM

## 2024-08-08 NOTE — Telephone Encounter (Unsigned)
 Copied from CRM [id number]. Topic: Clinical - Medication Refill >> Aug 08, 2024  4:34 PM [name] [name] wrote: Medication: diltiazem  (TIADYLT  ER) 300 MG 24 hr capsule  Has the patient contacted their pharmacy? Yes (Agent: If no, request that the patient contact the pharmacy for the refill. If patient does not wish to contact the pharmacy document the reason why and proceed with request.) (Agent: If yes, when and what did the pharmacy advise?)  This is the patient's preferred pharmacy:  [phone], [place] - [address] [place] [address] Phone: [phone] Fax: [phone]    Is this the correct pharmacy for this prescription? Yes If no, delete pharmacy and type the correct one.   Has the prescription been filled recently? No  Is the patient out of the medication? Yes  Has the patient been seen for an appointment in the last year OR does the patient have an upcoming appointment? Yes  Can we respond through MyChart? No  Agent: Please be advised that Rx refills may take up to 3 business days. We ask that you follow-up with your pharmacy.

## 2024-08-09 NOTE — Telephone Encounter (Signed)
 Needs an appointment, has not seen vickie since 2024

## 2024-08-23 ENCOUNTER — Ambulatory Visit: Admitting: Family Medicine

## 2024-09-12 ENCOUNTER — Encounter: Payer: Self-pay | Admitting: Family Medicine

## 2024-09-12 ENCOUNTER — Telehealth: Admitting: Family Medicine

## 2024-09-12 DIAGNOSIS — I10 Essential (primary) hypertension: Secondary | ICD-10-CM

## 2024-09-12 DIAGNOSIS — E538 Deficiency of other specified B group vitamins: Secondary | ICD-10-CM | POA: Diagnosis not present

## 2024-09-12 DIAGNOSIS — Z7984 Long term (current) use of oral hypoglycemic drugs: Secondary | ICD-10-CM

## 2024-09-12 DIAGNOSIS — E559 Vitamin D deficiency, unspecified: Secondary | ICD-10-CM | POA: Diagnosis not present

## 2024-09-12 DIAGNOSIS — E785 Hyperlipidemia, unspecified: Secondary | ICD-10-CM

## 2024-09-12 DIAGNOSIS — R6889 Other general symptoms and signs: Secondary | ICD-10-CM

## 2024-09-12 DIAGNOSIS — E1165 Type 2 diabetes mellitus with hyperglycemia: Secondary | ICD-10-CM

## 2024-09-12 DIAGNOSIS — E1169 Type 2 diabetes mellitus with other specified complication: Secondary | ICD-10-CM

## 2024-09-12 MED ORDER — DILTIAZEM HCL ER BEADS 300 MG PO CP24: ORAL_CAPSULE | ORAL | 0 refills | Status: DC

## 2024-09-12 NOTE — Progress Notes (Signed)
 MyChart Video Visit    Virtual Visit via Video Note    Patient location: Home. Patient and provider in visit. Daughter [name] in visit and they are in [place] currently  Provider location: Office 2 patient identifiers used   I discussed the limitations of evaluation and management by telemedicine and the availability of in person appointments. The patient expressed understanding and agreed to proceed.  Visit Date: 09/12/2024  Today's healthcare provider: Boby Mackintosh, NP-C     Subjective:    Patient ID: [name] [name], female    DOB: 30-Sep-1946, 78 y.o.   MRN: [id number]  No chief complaint on file.   HPI  Daughter [name] [name]   Discussed the use of AI scribe software for clinical note transcription with the patient, who gave verbal consent to proceed.  History of Present Illness [name] [name] is a 78 year old female with diabetes and hypertension who presents virtually for medication management and follow-up. She is accompanied by her daughter, [name].  Glycemic control - Diabetes managed with Januvia  and glipizide  - Last hemoglobin A1c was 8.7% in July of the previous year - Recent fasting blood glucose is 117 mg/dL - Highest recorded blood glucose is 150-160 mg/dL - No hypoglycemic or hyperglycemic episodes  Hypertension management - Hypertension managed with diltiazem  - Has been out of diltiazem  for some time - Blood pressure not routinely monitored at home - Daughter assists with medication adherence - Plan to start home blood pressure monitoring using daughter's husband's machine  Chronic kidney disease - Chronic kidney disease present - No recent blood work or nephrology visits this year - Only specialist visit this year was to a podiatrist  General symptoms and constitutional status - Generally feels well - No dizziness, chest pain, palpitations, shortness of breath, nausea, vomiting, diarrhea, or urinary symptoms - Persistent sensation of  coldness - No vitamin D  supplementation despite previously low levels - Plans to start a multivitamin with vitamin D  and B12       Past Medical History:  Diagnosis Date   Bilateral knee pain    Euflexxa x3   Chest pain 06/2007   CP and QW on EKG: neg stress test    Diabetes mellitus    w/ neuropathy   Diverticulitis 2000   Gout    Hyperlipidemia    Hypertension    Insomnia 02/20/2013   Osteoarthritis    gout, CPPD--- sees Dr.Davenshwar   Osteoarthritis of both feet 09/14/2016   Osteoarthritis of both hands 09/14/2016   Osteoarthritis of both knees 09/14/2016   Osteopenia    per DEXA 4/10   Recurrent boils    h/o MRSA   Type 2 diabetes mellitus without complication 12/17/2020    Past Surgical History:  Procedure Laterality Date   CHOLECYSTECTOMY     TONSILLECTOMY      Family History  Problem Relation Age of Onset   Hypertension Mother    Diabetes Mother    Hypertension Father    Diabetes Father    Hypertension Daughter    Hypertension Sister    Diabetes Sister    Coronary artery disease Neg Hx    Stroke Neg Hx    Colon cancer Neg Hx    Breast cancer Neg Hx     Social History   Socioeconomic History   Marital status: Married    Spouse name: herbert   Number of children: 3   Years of education: college   Highest education level: 12th grade  Occupational  History   Occupation: Retired- 2009, doing part time work sometimes     Comment: 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   Patient is right handed.   Patient has college education.   Patient drinks 3-4 daily.   Social Drivers of  Corporate Investment Banker Strain: Low Risk  (09/11/2024)   Overall Financial Resource Strain (CARDIA)    Difficulty of Paying Living Expenses: Not hard at all  Food Insecurity: No Food Insecurity (09/11/2024)   Hunger Vital Sign    Worried About Running Out of Food in the Last Year: Never true    Ran Out of Food in the Last Year: Never true  Transportation Needs: No Transportation Needs (09/11/2024)   PRAPARE - Administrator, Civil Service (Medical): No    Lack of Transportation (Non-Medical): No  Physical Activity: Inactive (09/11/2024)   Exercise Vital Sign    Days of Exercise per Week: 0 days    Minutes of Exercise per Session: Not on file  Stress: No Stress Concern Present (09/11/2024)   [employer] of Occupational Health - Occupational Stress Questionnaire    Feeling of Stress: Not at all  Social Connections: Moderately Integrated (09/11/2024)   Social Connection and Isolation Panel    Frequency of Communication with Friends and Family: More than three times a week    Frequency of Social Gatherings with Friends and Family: More than three times a week    Attends Religious Services: More than 4 times per year    Active Member of [employer] or Organizations: Yes    Attends Banker Meetings: More than 4 times per year    Marital Status: Widowed  Intimate Partner Violence: Not At Risk (03/13/2024)   Humiliation, Afraid, Rape, and Kick questionnaire    Fear of Current or Ex-Partner: No    Emotionally Abused: No    Physically Abused: No    Sexually Abused: No    Outpatient Medications Prior to Visit  Medication Sig Dispense Refill   aspirin 81 MG tablet Take 81 mg by mouth daily.     donepezil  (ARICEPT ) 5 MG tablet Take 1 tablet (5 mg total) by mouth at bedtime. 30 tablet 3   glipiZIDE  (GLUCOTROL  XL) 2.5 MG 24 hr tablet TAKE 1 TABLET BY MOUTH 2 TIMES DAILY. 180 tablet 0   glucose blood (ONETOUCH VERIO) test strip USE TO TEST BLOOD SUGAR 3 TIMES DAILY  100 each 0   JANUVIA  100 MG tablet Take 1 tablet (100 mg total) by mouth daily. **NEEDS APPOINTMENT FOR REFILLS** 30 tablet 0   Potassium 99 MG TABS Take by mouth.     Continuous Blood Gluc Receiver (DEXCOM G7 RECEIVER) DEVI Use to check Blood sugars up to 3 times daily (Patient not taking: Reported on 09/12/2024) 1 each 0   Continuous Blood Gluc Sensor (DEXCOM G7 SENSOR) MISC Use to check blood sugars up to 3 times daily. Replace sensor every 10 days (Patient not taking: Reported on 09/12/2024) 3 each 4  diltiazem  (TIADYLT  ER) 300 MG 24 hr capsule TAKE 1 CAPSULE BY MOUTH EVERY DAY. Needs follow up with Augustus Zurawski for refills. 30 capsule 0   empagliflozin  (JARDIANCE ) 10 MG TABS tablet Take 1 tablet (10 mg total) by mouth daily. 30 tablet 2   Vitamin D , Ergocalciferol , (DRISDOL ) 1.25 MG (50000 UNIT) CAPS capsule Take 1 capsule (50,000 Units total) by mouth every 7 (seven) days. 4 capsule 1   No facility-administered medications prior to visit.    Allergies  Allergen Reactions   Colchicine  Nausea Only   Hydrocodone Other (See Comments)   Sulindac Other (See Comments)   Tramadol Other (See Comments)    Made me feel like I was in another world.   Celecoxib Rash   Glimepiride  Rash    ROS Per HPI    Objective:    Physical Exam Constitutional:      General: She is not in acute distress.    Appearance: She is not ill-appearing.  Pulmonary:     Effort: Pulmonary effort is normal.  Musculoskeletal:     Cervical back: Normal range of motion.  Neurological:     General: No focal deficit present.     Mental Status: She is alert.  Psychiatric:        Mood and Affect: Mood normal.        Behavior: Behavior normal.     There were no vitals taken for this visit. Wt Readings from Last 3 Encounters:  05/08/24 146 lb (66.2 kg)  03/13/24 148 lb (67.1 kg)  02/15/24 148 lb (67.1 kg)       Assessment & Plan:   Problem List Items Addressed This Visit     B12 deficiency   Essential  hypertension   Relevant Medications   diltiazem  (TIADYLT  ER) 300 MG 24 hr capsule   Hyperlipidemia associated with type 2 diabetes mellitus (HCC)   Relevant Medications   diltiazem  (TIADYLT  ER) 300 MG 24 hr capsule   Uncontrolled diabetes mellitus with hyperglycemia, without long-term current use of insulin  (HCC) - Primary   Vitamin D  deficiency   Other Visit Diagnoses       Sensation of feeling cold          Assessment and Plan Assessment & Plan Type 2 diabetes mellitus Type 2 diabetes mellitus with a last recorded hemoglobin A1c of 8.7% in July of the previous year, indicating poor control at that time. Current fasting blood glucose levels are generally well-controlled, with readings not exceeding 160 mg/dL. No recent episodes of hypoglycemia or hyperglycemia reported. - Continue Januvia  and glipizide . - Schedule blood work to assess current diabetes control. - Plan for an in-person visit to evaluate diabetes management further.  Essential hypertension Essential hypertension managed with diltiazem . She has been out of medication due to a lapse in refills. No recent blood pressure readings available. - Refill diltiazem  prescription for 30 days. - Encourage regular blood pressure monitoring at home. - Plan for an in-person visit to perform an EKG and assess blood pressure control.  Chronic kidney disease stage 2 Chronic kidney disease stage 2, likely secondary to diabetes and hypertension. Last kidney function tests indicated mild impairment. - Schedule blood work to reassess kidney function during the upcoming visit.  Vitamin D  deficiency Vitamin D  deficiency. She is not currently taking a vitamin D  supplement. - Recommend over-the-counter vitamin D  supplementation. - Consider a multivitamin that includes vitamin D .  Vitamin B12 deficiency Vitamin B12 deficiency. She is not currently taking a B12 supplement. - Recommend  a multivitamin that includes vitamin B12.  General  Health Maintenance General health maintenance discussed, including the importance of regular monitoring and supplementation. - Recommend a multivitamin to address vitamin D  and B12 deficiencies. - Encourage regular physical activity to improve overall health and circulation.  Follow-Up Follow-up visit planned to reassess multiple health conditions and ensure proper management. - Schedule an in-person visit for the week of November 10th to 14th, preferably on Wednesday. - Perform blood work and EKG during the follow-up visit. - Front office to call and confirm the appointment schedule.    I have discontinued Dickey HERO. [place]'s Dexcom G7 Sensor, Dexcom G7 Receiver, empagliflozin , and Vitamin D  (Ergocalciferol ). I am also having her maintain her aspirin, glucose blood, Potassium, donepezil , glipiZIDE , Januvia , and diltiazem .  Meds ordered this encounter  Medications   diltiazem  (TIADYLT  ER) 300 MG 24 hr capsule    Sig: TAKE 1 CAPSULE BY MOUTH EVERY DAY. Needs follow up with Caren Garske for refills.    Dispense:  30 capsule    Refill:  0    Supervising Provider:   ROLLENE NORRIS A [[id number]]    I discussed the assessment and treatment plan with the patient. The patient was provided an opportunity to ask questions and all were answered. The patient agreed with the plan and demonstrated an understanding of the instructions.   The patient was advised to call back or seek an in-person evaluation if the symptoms worsen or if the condition fails to improve as anticipated.     Boby Mackintosh, NP-C [facility] at [place] [phone] (phone) [phone] (fax)  [facility]

## 2024-09-19 ENCOUNTER — Ambulatory Visit: Admitting: Podiatry

## 2024-09-19 DIAGNOSIS — E119 Type 2 diabetes mellitus without complications: Secondary | ICD-10-CM | POA: Diagnosis not present

## 2024-09-19 DIAGNOSIS — M79675 Pain in left toe(s): Secondary | ICD-10-CM

## 2024-09-19 DIAGNOSIS — M79674 Pain in right toe(s): Secondary | ICD-10-CM | POA: Diagnosis not present

## 2024-09-19 DIAGNOSIS — B351 Tinea unguium: Secondary | ICD-10-CM | POA: Diagnosis not present

## 2024-09-26 ENCOUNTER — Ambulatory Visit: Admitting: Family Medicine

## 2024-09-27 ENCOUNTER — Encounter: Payer: Self-pay | Admitting: Podiatry

## 2024-09-27 NOTE — Progress Notes (Signed)
  Subjective:  Patient ID: [name] [name], female    DOB: Mar 30, 1946,  MRN: [id number]  [name] [name] presents to clinic today for preventative diabetic foot care for painful mycotic toenails of both feet that are difficult to trim. Pain interferes with daily activities and wearing enclosed shoe gear comfortably.  Chief Complaint  Patient presents with   Diabetes    DFC NIDDM A1C 8.7. Toenail trim. LOV with PCP 07/08/24.   New problem(s): None.   PCP is Henson, Vickie L, NP-C.  Allergies  Allergen Reactions   Colchicine  Nausea Only   Hydrocodone Other (See Comments)   Sulindac Other (See Comments)   Tramadol Other (See Comments)    Made me feel like I was in another world.   Celecoxib Rash   Glimepiride  Rash    Review of Systems: Negative except as noted in the HPI.  Objective: No changes noted in today's physical examination. There were no vitals filed for this visit. [name] [name] is a pleasant 78 y.o. female WD, WN in NAD. AAO x 3.  Vascular Examination: CFT <3 seconds b/l. DP/PT pulses faintly palpable b/l. Pedal edema absent. Skin temperature gradient warm to warm b/l. Digital hair absent. No pain with calf compression. No ischemia or gangrene. No cyanosis or clubbing noted b/l.    Neurological Examination: Sensation grossly intact b/l with 10 gram monofilament. Vibratory sensation intact b/l.   Dermatological Examination: No open wounds. No interdigital macerations.  Toenails 1-5 b/l thick, discolored, elongated with subungual debris and pain on dorsal palpation.    Pedal skin thin and atrophic b/l LE. No corns, calluses nor porokeratotic lesions noted.  Musculoskeletal Examination: Muscle strength 5/5 to all lower extremity muscle groups bilaterally. HAV with bunion deformity noted b/l LE.  Radiographs: None  Assessment/Plan: 1. Pain due to onychomycosis of toenails of both feet   2. Type 2 diabetes mellitus without complication, without long-term  current use of insulin  [facility])   -Patient was evaluated today. All questions/concerns addressed on today's visit. -Continue foot and shoe inspections daily. Monitor blood glucose per PCP/Endocrinologist's recommendations. -Patient to continue soft, supportive shoe gear daily. -Mycotic toenails 1-5 bilaterally were debrided in length and girth with sterile nail nippers and dremel. Treatment was provided by assistant Andrez Manchester under my supervision. Pinpoint bleeding of L 4th toe addressed with Lumicain Hemostatic Solution, cleansed with alcohol. Triple antibiotic ointment applied. No further treatment required by patient/caregiver. Return in about 3 months (around 12/20/2024).  Delon LITTIE Merlin, DPM      [place] LOCATION: [address], [place] [address]                   Office [phone]   [facility] LOCATION: [address] [place], [place] [address] Office [phone]

## 2024-10-05 ENCOUNTER — Ambulatory Visit: Admitting: Family Medicine

## 2024-10-05 ENCOUNTER — Encounter: Payer: Self-pay | Admitting: Family Medicine

## 2024-10-05 VITALS — BP 100/72 | HR 82 | Temp 98.1°F | Ht 64.0 in | Wt 145.0 lb

## 2024-10-05 DIAGNOSIS — M150 Primary generalized (osteo)arthritis: Secondary | ICD-10-CM

## 2024-10-05 DIAGNOSIS — Z23 Encounter for immunization: Secondary | ICD-10-CM | POA: Diagnosis not present

## 2024-10-05 DIAGNOSIS — E1165 Type 2 diabetes mellitus with hyperglycemia: Secondary | ICD-10-CM | POA: Diagnosis not present

## 2024-10-05 DIAGNOSIS — Z0000 Encounter for general adult medical examination without abnormal findings: Secondary | ICD-10-CM | POA: Diagnosis not present

## 2024-10-05 DIAGNOSIS — E1169 Type 2 diabetes mellitus with other specified complication: Secondary | ICD-10-CM | POA: Diagnosis not present

## 2024-10-05 DIAGNOSIS — M89319 Hypertrophy of bone, unspecified shoulder: Secondary | ICD-10-CM

## 2024-10-05 DIAGNOSIS — Z789 Other specified health status: Secondary | ICD-10-CM

## 2024-10-05 DIAGNOSIS — Z1211 Encounter for screening for malignant neoplasm of colon: Secondary | ICD-10-CM

## 2024-10-05 DIAGNOSIS — Z0001 Encounter for general adult medical examination with abnormal findings: Secondary | ICD-10-CM

## 2024-10-05 DIAGNOSIS — E559 Vitamin D deficiency, unspecified: Secondary | ICD-10-CM

## 2024-10-05 DIAGNOSIS — D649 Anemia, unspecified: Secondary | ICD-10-CM

## 2024-10-05 DIAGNOSIS — K635 Polyp of colon: Secondary | ICD-10-CM

## 2024-10-05 DIAGNOSIS — E538 Deficiency of other specified B group vitamins: Secondary | ICD-10-CM | POA: Diagnosis not present

## 2024-10-05 DIAGNOSIS — I10 Essential (primary) hypertension: Secondary | ICD-10-CM

## 2024-10-05 DIAGNOSIS — H9193 Unspecified hearing loss, bilateral: Secondary | ICD-10-CM

## 2024-10-05 DIAGNOSIS — N183 Chronic kidney disease, stage 3 unspecified: Secondary | ICD-10-CM

## 2024-10-05 DIAGNOSIS — G3184 Mild cognitive impairment, so stated: Secondary | ICD-10-CM

## 2024-10-05 DIAGNOSIS — E785 Hyperlipidemia, unspecified: Secondary | ICD-10-CM

## 2024-10-05 MED ORDER — JANUVIA 100 MG PO TABS: 100.0000 mg | ORAL_TABLET | Freq: Every day | ORAL | 0 refills | Status: AC

## 2024-10-05 NOTE — Progress Notes (Signed)
 Subjective:     Patient ID: [name] [name], female    DOB: Aug 19, 1946, 78 y.o.   MRN: [id number]  Chief Complaint  Patient presents with   Follow-up    HPI  Discussed the use of AI scribe software for clinical note transcription with the patient, who gave verbal consent to proceed.  History of Present Illness [name] [name] is a 78 year old female with uncontrolled diabetes who presents for follow-up on her chronic health conditions and annual physical exam/preventive healthcare. She is accompanied by her daughter, [name] [name].  Hyperglycemia and diabetes management - Uncontrolled diabetes mellitus managed with Januvia  and glipizide  - Recent morning blood glucose levels between 140 and 160 mg/dL - Hemoglobin J8r was 1.2% in July - No blurred vision, polydipsia, polyphagia, or polyuria - scheduled for eye exam   Arthralgia - Joint discomfort with variable location and size - Warm rag and joint cream provide some relief - Joint area less enlarged but remains uncomfortable - No current joint pain  Cardiovascular symptoms and medication use - Resumed diltiazem  and taking aspirin - No dizziness, lightheadedness, or low energy  Cognitive changes - Stable memory issues with preserved long-term memory - Some decline in short-term memory - Utilizes strategies such as writing down meals to manage memory - Taking 'Brain Defender' supplement for memory support  Vitamin deficiencies - Takes over-the-counter supplements for low vitamin D  and B12 levels  Statin intolerance - Not on cholesterol medication due to previous side effects from statins     Health Maintenance Due  Topic Date Due   Mammogram  10/05/2016   Bone Density Scan  08/06/2018   Colonoscopy  12/15/2020   OPHTHALMOLOGY EXAM  03/18/2023   HEMOGLOBIN A1C  11/25/2023   Diabetic kidney evaluation - eGFR measurement  05/24/2024   FOOT EXAM  07/12/2024    Past Medical History:  Diagnosis Date    Bilateral knee pain    Euflexxa x3   Chest pain 06/2007   CP and QW on EKG: neg stress test    Diabetes mellitus    w/ neuropathy   Diverticulitis 2000   Gout    Hyperlipidemia    Hypertension    Insomnia 02/20/2013   Osteoarthritis    gout, CPPD--- sees Dr.Davenshwar   Osteoarthritis of both feet 09/14/2016   Osteoarthritis of both hands 09/14/2016   Osteoarthritis of both knees 09/14/2016   Osteopenia    per DEXA 4/10   Recurrent boils    h/o MRSA   Type 2 diabetes mellitus without complication 12/17/2020    Past Surgical History:  Procedure Laterality Date   CHOLECYSTECTOMY     TONSILLECTOMY      Family History  Problem Relation Age of Onset   Hypertension Mother    Diabetes Mother    Hypertension Father    Diabetes Father    Hypertension Daughter    Hypertension Sister    Diabetes Sister    Coronary artery disease Neg Hx    Stroke Neg Hx    Colon cancer Neg Hx    Breast cancer Neg Hx     Social History   Socioeconomic History   Marital status: Married    Spouse name: herbert   Number of children: 3   Years of education: college   Highest education level: 12th grade  Occupational History   Occupation: Retired- 2009, doing part time work sometimes     Comment: 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   Patient is right handed.   Patient has college education.   Patient drinks 3-4 daily.   Social Drivers of Corporate Investment Banker Strain: Low Risk  (09/11/2024)   Overall Financial Resource Strain (CARDIA)    Difficulty of Paying Living Expenses: Not hard at all  Food  Insecurity: No Food Insecurity (09/11/2024)   Hunger Vital Sign    Worried About Running Out of Food in the Last Year: Never true    Ran Out of Food in the Last Year: Never true  Transportation Needs: No Transportation Needs (09/11/2024)   PRAPARE - Administrator, Civil Service (Medical): No    Lack of Transportation (Non-Medical): No  Physical Activity: Inactive (09/11/2024)   Exercise Vital Sign    Days of Exercise per Week: 0 days    Minutes of Exercise per Session: Not on file  Stress: No Stress Concern Present (09/11/2024)   [employer] of Occupational Health - Occupational Stress Questionnaire    Feeling of Stress: Not at all  Social Connections: Moderately Integrated (09/11/2024)   Social Connection and Isolation Panel    Frequency of Communication with Friends and Family: More than three times a week    Frequency of Social Gatherings with Friends and Family: More than three times a week    Attends Religious Services: More than 4 times per year    Active Member of [employer] or Organizations: Yes    Attends Banker Meetings: More than 4 times per year    Marital Status: Widowed  Intimate Partner Violence: Not At Risk (03/13/2024)   Humiliation, Afraid, Rape, and Kick questionnaire    Fear of Current or Ex-Partner: No    Emotionally Abused: No    Physically Abused: No    Sexually Abused: No    Outpatient Medications Prior to Visit  Medication Sig Dispense Refill   aspirin 81 MG tablet Take 81 mg by mouth daily.     diltiazem  (TIADYLT  ER) 300 MG 24 hr capsule TAKE 1 CAPSULE BY MOUTH EVERY DAY. Needs follow up with Shak[facility] Marlatt for refills. 30 capsule 0   glipiZIDE  (GLUCOTROL  XL) 2.5 MG 24 hr tablet TAKE 1 TABLET BY MOUTH 2 TIMES DAILY. 180 tablet 0   JANUVIA  100 MG tablet Take 1 tablet (100 mg total) by mouth daily. **NEEDS APPOINTMENT FOR REFILLS** 30 tablet 0   glucose blood (ONETOUCH VERIO) test strip USE TO TEST BLOOD SUGAR 3 TIMES DAILY 100 each 0    Potassium 99 MG TABS Take by mouth. (Patient not taking: Reported on 10/05/2024)     donepezil  (ARICEPT ) 5 MG tablet Take 1 tablet (5 mg total) by mouth at bedtime. 30 tablet 3   No facility-administered medications prior to visit.    Allergies  Allergen Reactions   Colchicine  Nausea Only   Hydrocodone Other (See Comments)   Sulindac Other (See Comments)   Tramadol Other (See Comments)    Made me feel like I was in another world.   Celecoxib Rash   Glimepiride  Rash  Review of Systems  Constitutional:  Negative for chills, fever, malaise/fatigue and weight loss.  HENT:  Positive for hearing loss. Negative for congestion, ear pain, sinus pain and sore throat.   Eyes:  Negative for blurred vision, double vision and pain.  Respiratory:  Negative for cough, shortness of breath and wheezing.   Cardiovascular:  Negative for chest pain, palpitations and leg swelling.  Gastrointestinal:  Negative for abdominal pain, constipation, diarrhea, nausea and vomiting.  Genitourinary:  Negative for dysuria, frequency and urgency.  Musculoskeletal:  Positive for joint pain. Negative for back pain, falls and myalgias.  Skin:  Negative for rash.  Neurological:  Negative for dizziness, tingling, focal weakness and headaches.  Psychiatric/Behavioral:  Positive for memory loss. Negative for depression. The patient is not nervous/anxious.        Objective:    Physical Exam Constitutional:      General: She is not in acute distress.    Appearance: She is not ill-appearing.  HENT:     Right Ear: Tympanic membrane, ear canal and external ear normal.     Left Ear: Tympanic membrane, ear canal and external ear normal.     Nose: Nose normal.     Mouth/Throat:     Mouth: Mucous membranes are moist.     Pharynx: Oropharynx is clear.  Eyes:     Extraocular Movements: Extraocular movements intact.     Conjunctiva/sclera: Conjunctivae normal.     Pupils: Pupils are equal, round, and reactive to  light.  Neck:     Thyroid : No thyroid  mass, thyromegaly or thyroid  tenderness.  Cardiovascular:     Rate and Rhythm: Normal rate and regular rhythm.     Pulses: Normal pulses.     Heart sounds: Normal heart sounds.  Pulmonary:     Effort: Pulmonary effort is normal.     Breath sounds: Normal breath sounds.  Abdominal:     General: Bowel sounds are normal.     Palpations: Abdomen is soft.     Tenderness: There is no abdominal tenderness. There is no guarding or rebound.  Musculoskeletal:        General: Normal range of motion.     Cervical back: Normal range of motion and neck supple. No tenderness.     Right lower leg: No edema.     Left lower leg: No edema.  Lymphadenopathy:     Cervical: No cervical adenopathy.  Skin:    General: Skin is warm and dry.     Findings: No lesion or rash.  Neurological:     General: No focal deficit present.     Mental Status: She is alert. Mental status is at baseline.     Cranial Nerves: No cranial nerve deficit.     Sensory: No sensory deficit.     Motor: No weakness.     Gait: Gait normal.  Psychiatric:        Mood and Affect: Mood normal.        Behavior: Behavior normal.        Thought Content: Thought content normal.      BP 100/72 (BP Location: Right Arm, Patient Position: Sitting, Cuff Size: Normal)   Pulse 82   Temp 98.1 F (36.7 C) (Temporal)   Ht 5' 4 (1.626 m)   Wt 145 lb (65.8 kg)   SpO2 98%   BMI 24.89 kg/m  Wt Readings from Last 3 Encounters:  10/05/24 145 lb (65.8 kg)  05/08/24 146 lb (66.2 kg)  03/13/24  148 lb (67.1 kg)       Assessment & Plan:   Problem List Items Addressed This Visit     B12 deficiency   Relevant Orders   Vitamin B12   Essential hypertension   Relevant Orders   CBC with Differential/Platelet   Comprehensive metabolic panel with GFR   TSH   T4, free   EKG 12-Lead   Hyperlipidemia associated with type 2 diabetes mellitus (HCC)   Relevant Medications   JANUVIA  100 MG tablet    Other Relevant Orders   Lipid panel   Osteoarthritis   Stage 3 chronic kidney disease (HCC)   Relevant Orders   CBC with Differential/Platelet   Comprehensive metabolic panel with GFR   Uncontrolled diabetes mellitus with hyperglycemia, without long-term current use of insulin  (HCC) - Primary   Relevant Medications   JANUVIA  100 MG tablet   Other Relevant Orders   CBC with Differential/Platelet   Comprehensive metabolic panel with GFR   Hemoglobin A1c   Vitamin B12   TSH   T4, free   Vitamin D  deficiency   Relevant Orders   VITAMIN D  25 Hydroxy (Vit-D Deficiency, Fractures)   Other Visit Diagnoses       Clavicle enlargement         Statin intolerance         Mild anemia       Relevant Orders   CBC with Differential/Platelet   Comprehensive metabolic panel with GFR   Ferritin   Folate   Vitamin B12     Bilateral hearing loss, unspecified hearing loss type       Relevant Orders   Ambulatory referral to Audiology     Screen for colon cancer       Relevant Orders   Ambulatory referral to Gastroenterology     Polyp of colon, unspecified part of colon, unspecified type       Relevant Orders   Ambulatory referral to Gastroenterology     MCI (mild cognitive impairment)         Encounter for general adult medical examination with abnormal findings       Relevant Orders   EKG 12-Lead     Immunization due       Relevant Orders   Pneumococcal conjugate vaccine 20-valent (Prevnar 20) (Completed)   Flu vaccine HIGH DOSE PF(Fluzone Trivalent) (Completed)       Assessment and Plan Assessment & Plan Adult Wellness Visit Routine wellness visit with emphasis on regular screenings and vaccinations. - Administered flu shot and pneumonia vaccine - Ordered mammogram - Referral for colon cancer screening. Overdue for colonoscopy  - declines bone density.  - Referred to audiology for hearing evaluation  Type 2 diabetes mellitus, uncontrolled Uncontrolled diabetes with last A1c  of 8.7% in July. Blood sugars fluctuate between 140-160 mg/dL, no hypoglycemia or hyperglycemia. No symptoms of hyperglycemia such as blurred vision, thirst, or polyuria. - Refilled Januvia  prescription - Ordered blood work to assess current diabetes control  Essential hypertension Blood pressure well-controlled at 100/72 mmHg. No symptoms of hypotension such as dizziness or lightheadedness. Resumed diltiazem  after a lapse. - Continue diltiazem  for blood pressure management  Mild anemia - no obvious signs of bleeding  - check CBC, B12, folate, ferritin  Vitamin D  and B12 deficiencies Previous labs showed low vitamin D  and B12 levels. Currently taking over-the-counter supplements. - Ordered blood work to assess current vitamin D  and B12 levels  Memory impairment Mild memory impairment with no significant worsening. Previous  labs showed low vitamin D  and B12 levels. No statin use due to memory concerns. - Encouraged use of memory aids such as writing things down  Generalized osteoarthritis Joint discomfort, particularly in the knee. Symptoms have improved with warm compresses and topical creams. No significant pain reported. - Continue current management with warm compresses and topical creams  Unspecified bilateral hearing loss Hearing loss may contribute to perceived memory issues. - Referred to audiology for hearing evaluation  Chronic kidney disease - check renal function.    EKG shows NSR, rate 78, LVH, ?old anterior infarct, no significant changes from EKG in 2019   I have discontinued Iyanla M. Westall's donepezil . I have also changed her Januvia . Additionally, I am having her maintain her aspirin, glucose blood, Potassium, glipiZIDE , and diltiazem .  Meds ordered this encounter  Medications   JANUVIA  100 MG tablet    Sig: Take 1 tablet (100 mg total) by mouth daily.    Dispense:  90 tablet    Refill:  0

## 2024-10-05 NOTE — Patient Instructions (Addendum)
 Please go to [address] for labs now.  The lab is in the basement.  Take the elevator to level B.   You may call to schedule your mammogram at the breast center.  Phone number [phone]  I referred you to the audiologist for hearing tests.  They should call you to schedule a visit.  I referred you to [facility] gastroenterology for colon cancer screening.  They should call you to schedule a visit.  You received a pneumonia vaccine today, Prevnar 20, and your flu shot today.

## 2024-10-15 ENCOUNTER — Telehealth: Payer: Self-pay | Admitting: Family Medicine

## 2024-10-15 DIAGNOSIS — I10 Essential (primary) hypertension: Secondary | ICD-10-CM

## 2024-10-15 NOTE — Telephone Encounter (Unsigned)
 Copied from CRM #[id number]. Topic: Clinical - Medication Refill >> Oct 15, 2024  2:52 PM Deaijah H wrote: Medication: diltiazem  (TIADYLT  ER) 300 MG 24 hr capsule  Has the patient contacted their pharmacy? Yes (Agent: If no, request that the patient contact the pharmacy for the refill. If patient does not wish to contact the pharmacy document the reason why and proceed with request.) (Agent: If yes, when and what did the pharmacy advise?)  This is the patient's preferred pharmacy:    [id number] - [place], [place] - [address] [place] [address] Phone: [phone] Fax: [phone]  Is this the correct pharmacy for this prescription? Yes If no, delete pharmacy and type the correct one.   Has the prescription been filled recently? Yes  Is the patient out of the medication? Yes  Has the patient been seen for an appointment in the last year OR does the patient have an upcoming appointment? Yes  Can we respond through MyChart? Yes  Agent: Please be advised that Rx refills may take up to 3 business days. We ask that you follow-up with your pharmacy.

## 2024-10-16 ENCOUNTER — Telehealth: Payer: Self-pay

## 2024-10-16 DIAGNOSIS — I10 Essential (primary) hypertension: Secondary | ICD-10-CM

## 2024-10-16 NOTE — Telephone Encounter (Signed)
 Copied from CRM [id number]. Topic: Clinical - Medication Question >> Oct 16, 2024  3:39 PM Aisha D wrote: Reason for CRM: Pt's daughter Jhonnie wants to know if the diltiazem  (TIADYLT  ER) 300 MG 24 hr capsule refill request can be approved today due to the pt being out of medication.

## 2024-10-17 MED ORDER — DILTIAZEM HCL ER BEADS 300 MG PO CP24: ORAL_CAPSULE | ORAL | 1 refills | Status: AC

## 2024-10-17 NOTE — Addendum Note (Signed)
 Addended by: Demitra Danley E on: 10/17/2024 07:30 AM   Modules accepted: Orders

## 2024-10-17 NOTE — Telephone Encounter (Signed)
 Rx has been sent

## 2024-10-29 ENCOUNTER — Other Ambulatory Visit

## 2024-10-29 DIAGNOSIS — N183 Chronic kidney disease, stage 3 unspecified: Secondary | ICD-10-CM

## 2024-10-29 DIAGNOSIS — E559 Vitamin D deficiency, unspecified: Secondary | ICD-10-CM

## 2024-10-29 DIAGNOSIS — I10 Essential (primary) hypertension: Secondary | ICD-10-CM

## 2024-10-29 DIAGNOSIS — E538 Deficiency of other specified B group vitamins: Secondary | ICD-10-CM

## 2024-10-29 DIAGNOSIS — D649 Anemia, unspecified: Secondary | ICD-10-CM

## 2024-10-29 DIAGNOSIS — E1169 Type 2 diabetes mellitus with other specified complication: Secondary | ICD-10-CM

## 2024-10-29 DIAGNOSIS — E1165 Type 2 diabetes mellitus with hyperglycemia: Secondary | ICD-10-CM

## 2024-10-29 LAB — COMPREHENSIVE METABOLIC PANEL WITH GFR
ALT: 8 U/L (ref 0–35)
AST: 9 U/L (ref 0–37)
Albumin: 4.2 g/dL (ref 3.5–5.2)
Alkaline Phosphatase: 116 U/L (ref 39–117)
BUN: 8 mg/dL (ref 6–23)
CO2: 29 meq/L (ref 19–32)
Calcium: 9.5 mg/dL (ref 8.4–10.5)
Chloride: 105 meq/L (ref 96–112)
Creatinine, Ser: 0.78 mg/dL (ref 0.40–1.20)
GFR: 73.29 mL/min (ref >60.00)
Glucose, Bld: 204 mg/dL — ABNORMAL HIGH (ref 70–99)
Potassium: 3.1 meq/L — ABNORMAL LOW (ref 3.5–5.1)
Sodium: 142 meq/L (ref 135–145)
Total Bilirubin: 0.3 mg/dL (ref 0.2–1.2)
Total Protein: 7.2 g/dL (ref 6.0–8.3)

## 2024-10-29 LAB — CBC WITH DIFFERENTIAL/PLATELET
Basophils Absolute: 0.1 K/uL (ref 0.0–0.1)
Basophils Relative: 1.2 % (ref 0.0–3.0)
Eosinophils Absolute: 0.2 K/uL (ref 0.0–0.7)
Eosinophils Relative: 3.6 % (ref 0.0–5.0)
HCT: 35.2 % — ABNORMAL LOW (ref 36.0–46.0)
Hemoglobin: 12.1 g/dL (ref 12.0–15.0)
Lymphocytes Relative: 44.6 % (ref 12.0–46.0)
Lymphs Abs: 2.3 K/uL (ref 0.7–4.0)
MCHC: 34.4 g/dL (ref 30.0–36.0)
MCV: 86.2 fl (ref 78.0–100.0)
Monocytes Absolute: 0.4 K/uL (ref 0.1–1.0)
Monocytes Relative: 8 % (ref 3.0–12.0)
Neutro Abs: 2.2 K/uL (ref 1.4–7.7)
Neutrophils Relative %: 42.6 % — ABNORMAL LOW (ref 43.0–77.0)
Platelets: 246 K/uL (ref 150.0–400.0)
RBC: 4.09 Mil/uL (ref 3.87–5.11)
RDW: 14.7 % (ref 11.5–15.5)
WBC: 5.2 K/uL (ref 4.0–10.5)

## 2024-10-29 LAB — LIPID PANEL
Cholesterol: 179 mg/dL (ref 0–200)
HDL: 71.5 mg/dL (ref >39.00)
LDL Cholesterol: 90 mg/dL (ref 0–99)
NonHDL: 107.58
Total CHOL/HDL Ratio: 3
Triglycerides: 90 mg/dL (ref 0.0–149.0)
VLDL: 18 mg/dL (ref 0.0–40.0)

## 2024-10-29 LAB — FOLATE: Folate: 13.6 ng/mL (ref >5.9)

## 2024-10-29 LAB — HEMOGLOBIN A1C: Hgb A1c MFr Bld: 7.4 % — ABNORMAL HIGH (ref 4.6–6.5)

## 2024-10-29 LAB — T4, FREE: Free T4: 0.99 ng/dL (ref 0.60–1.60)

## 2024-10-29 LAB — TSH: TSH: 1.09 u[IU]/mL (ref 0.35–5.50)

## 2024-10-29 LAB — VITAMIN B12: Vitamin B-12: 246 pg/mL (ref 211–911)

## 2024-10-29 LAB — VITAMIN D 25 HYDROXY (VIT D DEFICIENCY, FRACTURES): VITD: 8.36 ng/mL — ABNORMAL LOW (ref 30.00–100.00)

## 2024-10-29 LAB — FERRITIN: Ferritin: 25 ng/mL (ref 10.0–291.0)

## 2024-10-30 ENCOUNTER — Ambulatory Visit: Payer: Self-pay | Admitting: Family Medicine

## 2024-10-30 MED ORDER — POTASSIUM CHLORIDE CRYS ER 20 MEQ PO TBCR: 20.0000 meq | EXTENDED_RELEASE_TABLET | Freq: Every day | ORAL | 0 refills | Status: AC

## 2024-10-30 MED ORDER — EZETIMIBE 10 MG PO TABS: 10.0000 mg | ORAL_TABLET | Freq: Every day | ORAL | 1 refills | Status: AC

## 2024-10-30 MED ORDER — VITAMIN D (ERGOCALCIFEROL) 1.25 MG (50000 UNIT) PO CAPS: 50000.0000 [IU] | ORAL_CAPSULE | ORAL | 1 refills | Status: AC

## 2024-11-13 ENCOUNTER — Encounter: Payer: Self-pay | Admitting: *Deleted

## 2024-11-13 NOTE — Progress Notes (Signed)
 [name] [name]                                          MRN: [id number]   11/13/2024   The [facility] Quality Team Specialist reviewed this patient medical record for the purposes of chart review for care gap closure. The following were reviewed: chart review for care gap closure-kidney health evaluation for diabetes:eGFR  and uACR.    [facility] Quality Team

## 2024-11-22 LAB — OPHTHALMOLOGY REPORT-SCANNED

## 2024-11-28 ENCOUNTER — Telehealth: Payer: Self-pay

## 2024-11-28 NOTE — Telephone Encounter (Signed)
 noted

## 2024-11-28 NOTE — Telephone Encounter (Signed)
 Copied from CRM [id number]. Topic: Referral - Question >> Nov 28, 2024 11:59 AM Charolett L wrote: Reason for CRM:  [employer] nurse Randine from [facility] called in and stated that she just added [employer] [facility] to the referral because the patient needs to see the [employer] provider prior to other services

## 2024-11-29 ENCOUNTER — Other Ambulatory Visit: Payer: Self-pay | Admitting: Family Medicine

## 2024-11-29 DIAGNOSIS — E1165 Type 2 diabetes mellitus with hyperglycemia: Secondary | ICD-10-CM

## 2024-11-29 MED ORDER — GLIPIZIDE ER 2.5 MG PO TB24: ORAL_TABLET | ORAL | 0 refills | Status: AC

## 2024-11-29 NOTE — Telephone Encounter (Signed)
 Copied from CRM [id number]. Topic: Clinical - Medication Refill >> Nov 29, 2024 10:19 AM Alfonso HERO wrote: Medication: glipiZIDE  (GLUCOTROL  XL) 2.5 MG 24 hr tablet  Has the patient contacted their pharmacy? Yes (Agent: If no, request that the patient contact the pharmacy for the refill. If patient does not wish to contact the pharmacy document the reason why and proceed with request.) (Agent: If yes, when and what did the pharmacy advise?)  This is the patient's preferred pharmacy:  [id number] [place], [place] - [address] [place] [address] Phone: [phone] Fax: [phone]   Is this the correct pharmacy for this prescription? Yes If no, delete pharmacy and type the correct one.   Has the prescription been filled recently? Yes  Is the patient out of the medication? Yes  Has the patient been seen for an appointment in the last year OR does the patient have an upcoming appointment? Yes  Can we respond through MyChart? Yes  Agent: Please be advised that Rx refills may take up to 3 business days. We ask that you follow-up with your pharmacy.

## 2025-01-02 ENCOUNTER — Ambulatory Visit: Admitting: Podiatry

## 2025-01-08 ENCOUNTER — Institutional Professional Consult (permissible substitution) (INDEPENDENT_AMBULATORY_CARE_PROVIDER_SITE_OTHER): Admitting: Otolaryngology

## 2025-03-21 ENCOUNTER — Ambulatory Visit

## 2025-03-21 ENCOUNTER — Encounter: Admitting: Family Medicine
